# Patient Record
Sex: Female | Born: 1945 | Race: White | Hispanic: No | Marital: Married | Smoking: Never smoker
Health system: Southern US, Community
[De-identification: ages and names within clinical notes are randomized; demographics above are authoritative.]

## PROBLEM LIST (undated history)

## (undated) DIAGNOSIS — B059 Measles without complication: Secondary | ICD-10-CM

## (undated) DIAGNOSIS — I10 Essential (primary) hypertension: Secondary | ICD-10-CM

## (undated) DIAGNOSIS — B019 Varicella without complication: Secondary | ICD-10-CM

## (undated) DIAGNOSIS — E039 Hypothyroidism, unspecified: Secondary | ICD-10-CM

## (undated) DIAGNOSIS — D126 Benign neoplasm of colon, unspecified: Secondary | ICD-10-CM

## (undated) DIAGNOSIS — J45909 Unspecified asthma, uncomplicated: Secondary | ICD-10-CM

## (undated) DIAGNOSIS — K219 Gastro-esophageal reflux disease without esophagitis: Secondary | ICD-10-CM

## (undated) DIAGNOSIS — M1990 Unspecified osteoarthritis, unspecified site: Secondary | ICD-10-CM

## (undated) DIAGNOSIS — A044 Other intestinal Escherichia coli infections: Secondary | ICD-10-CM

## (undated) DIAGNOSIS — C801 Malignant (primary) neoplasm, unspecified: Secondary | ICD-10-CM

## (undated) DIAGNOSIS — B3781 Candidal esophagitis: Secondary | ICD-10-CM

## (undated) DIAGNOSIS — K5732 Diverticulitis of large intestine without perforation or abscess without bleeding: Secondary | ICD-10-CM

## (undated) DIAGNOSIS — K649 Unspecified hemorrhoids: Secondary | ICD-10-CM

## (undated) DIAGNOSIS — J309 Allergic rhinitis, unspecified: Secondary | ICD-10-CM

## (undated) DIAGNOSIS — T7840XA Allergy, unspecified, initial encounter: Secondary | ICD-10-CM

## (undated) DIAGNOSIS — N951 Menopausal and female climacteric states: Secondary | ICD-10-CM

## (undated) HISTORY — DX: Benign neoplasm of colon, unspecified: D12.6

## (undated) HISTORY — DX: Varicella without complication: B01.9

## (undated) HISTORY — DX: Diverticulitis of large intestine without perforation or abscess without bleeding: K57.32

## (undated) HISTORY — PX: CARDIAC CATHETERIZATION: SHX172

## (undated) HISTORY — DX: Malignant (primary) neoplasm, unspecified: C80.1

## (undated) HISTORY — PX: DILATION AND CURETTAGE OF UTERUS: SHX78

## (undated) HISTORY — PX: OTHER SURGICAL HISTORY: SHX169

## (undated) HISTORY — DX: Allergy, unspecified, initial encounter: T78.40XA

## (undated) HISTORY — DX: Unspecified hemorrhoids: K64.9

## (undated) HISTORY — PX: ABDOMINAL HYSTERECTOMY: SHX81

## (undated) HISTORY — DX: Allergic rhinitis, unspecified: J30.9

## (undated) HISTORY — DX: Hypothyroidism, unspecified: E03.9

## (undated) HISTORY — DX: Gastro-esophageal reflux disease without esophagitis: K21.9

## (undated) HISTORY — DX: Candidal esophagitis: B37.81

## (undated) HISTORY — DX: Unspecified asthma, uncomplicated: J45.909

## (undated) HISTORY — DX: Essential (primary) hypertension: I10

## (undated) HISTORY — DX: Menopausal and female climacteric states: N95.1

## (undated) HISTORY — DX: Measles without complication: B05.9

## (undated) HISTORY — DX: Unspecified osteoarthritis, unspecified site: M19.90

## (undated) HISTORY — PX: WISDOM TOOTH EXTRACTION: SHX21

---

## 1988-10-21 HISTORY — PX: OTHER SURGICAL HISTORY: SHX169

## 1999-12-20 ENCOUNTER — Encounter: Payer: Self-pay | Admitting: Family Medicine

## 1999-12-20 ENCOUNTER — Encounter: Admission: RE | Admit: 1999-12-20 | Discharge: 1999-12-20 | Payer: Self-pay | Admitting: Family Medicine

## 2000-07-17 ENCOUNTER — Encounter: Admission: RE | Admit: 2000-07-17 | Discharge: 2000-07-17 | Payer: Self-pay | Admitting: Family Medicine

## 2000-07-17 ENCOUNTER — Encounter: Payer: Self-pay | Admitting: Family Medicine

## 2001-10-21 HISTORY — PX: TOTAL ABDOMINAL HYSTERECTOMY W/ BILATERAL SALPINGOOPHORECTOMY: SHX83

## 2004-03-27 ENCOUNTER — Emergency Department (HOSPITAL_COMMUNITY): Admission: EM | Admit: 2004-03-27 | Discharge: 2004-03-27 | Payer: Self-pay | Admitting: Emergency Medicine

## 2007-11-23 ENCOUNTER — Emergency Department: Payer: Self-pay | Admitting: Emergency Medicine

## 2008-11-27 ENCOUNTER — Emergency Department: Payer: Self-pay | Admitting: Internal Medicine

## 2008-12-07 ENCOUNTER — Ambulatory Visit: Payer: Self-pay | Admitting: Cardiology

## 2008-12-08 ENCOUNTER — Inpatient Hospital Stay (HOSPITAL_COMMUNITY): Admission: EM | Admit: 2008-12-08 | Discharge: 2008-12-09 | Payer: Self-pay | Admitting: Emergency Medicine

## 2008-12-09 HISTORY — PX: OTHER SURGICAL HISTORY: SHX169

## 2009-02-27 ENCOUNTER — Emergency Department (HOSPITAL_COMMUNITY): Admission: EM | Admit: 2009-02-27 | Discharge: 2009-02-27 | Payer: Self-pay | Admitting: Emergency Medicine

## 2009-03-03 ENCOUNTER — Ambulatory Visit: Payer: Self-pay | Admitting: Family Medicine

## 2009-09-05 ENCOUNTER — Ambulatory Visit: Payer: Self-pay | Admitting: Family Medicine

## 2009-10-12 ENCOUNTER — Ambulatory Visit: Payer: Self-pay | Admitting: Gastroenterology

## 2009-10-12 LAB — HM COLONOSCOPY

## 2010-12-24 LAB — HM PAP SMEAR: HM Pap smear: NORMAL

## 2011-01-29 LAB — POCT I-STAT, CHEM 8
BUN: 18 mg/dL (ref 6–23)
Calcium, Ion: 1.15 mmol/L (ref 1.12–1.32)
TCO2: 28 mmol/L (ref 0–100)

## 2011-02-05 LAB — POCT CARDIAC MARKERS
CKMB, poc: 2.9 ng/mL (ref 1.0–8.0)
Myoglobin, poc: 70.8 ng/mL (ref 12–200)
Myoglobin, poc: 73.4 ng/mL (ref 12–200)
Troponin i, poc: <0.05 ng/mL (ref 0.00–0.09)

## 2011-02-05 LAB — BASIC METABOLIC PANEL
BUN: 17 mg/dL (ref 6–23)
BUN: 18 mg/dL (ref 6–23)
CO2: 25 mEq/L (ref 19–32)
Calcium: 8.4 mg/dL (ref 8.4–10.5)
Calcium: 8.6 mg/dL (ref 8.4–10.5)
Creatinine, Ser: 0.76 mg/dL (ref 0.4–1.2)
GFR calc Af Amer: >60 mL/min (ref >60)
GFR calc non Af Amer: >60 mL/min (ref >60)
Glucose, Bld: 137 mg/dL — ABNORMAL HIGH (ref 70–99)
Glucose, Bld: 78 mg/dL (ref 70–99)
Potassium: 3.2 mEq/L — ABNORMAL LOW (ref 3.5–5.1)

## 2011-02-05 LAB — PROTIME-INR: Prothrombin Time: 13.5 seconds (ref 11.6–15.2)

## 2011-02-05 LAB — CBC
HCT: 40.2 % (ref 36.0–46.0)
MCHC: 34.7 g/dL (ref 30.0–36.0)
MCV: 94.1 fL (ref 78.0–100.0)
Platelets: 329 10*3/uL (ref 150–400)
Platelets: 342 10*3/uL (ref 150–400)
RDW: 12.1 % (ref 11.5–15.5)
RDW: 12.4 % (ref 11.5–15.5)
WBC: 7.7 10*3/uL (ref 4.0–10.5)

## 2011-02-05 LAB — CARDIAC PANEL(CRET KIN+CKTOT+MB+TROPI)
CK, MB: 5.6 ng/mL — ABNORMAL HIGH (ref 0.3–4.0)
Relative Index: 3.9 — ABNORMAL HIGH (ref 0.0–2.5)
Total CK: 145 U/L (ref 7–177)
Troponin I: <0.01 ng/mL (ref 0.00–0.06)

## 2011-02-05 LAB — LIPID PANEL
HDL: 58 mg/dL (ref >39)
LDL Cholesterol: 105 mg/dL — ABNORMAL HIGH (ref 0–99)
Triglycerides: 92 mg/dL (ref <150)
VLDL: 18 mg/dL (ref 0–40)

## 2011-02-05 LAB — URINALYSIS, ROUTINE W REFLEX MICROSCOPIC
Bilirubin Urine: NEGATIVE
Glucose, UA: NEGATIVE mg/dL
Ketones, ur: NEGATIVE mg/dL
Specific Gravity, Urine: 1.024 (ref 1.005–1.030)
pH: 6.5 (ref 5.0–8.0)

## 2011-02-05 LAB — CK TOTAL AND CKMB (NOT AT ARMC): Total CK: 131 U/L (ref 7–177)

## 2011-03-05 NOTE — H&P (Signed)
[name], CAR NO.:  [id number]   MEDICAL RECORD NO.:  [id number]          PATIENT TYPE:  EMS   LOCATION:  MAJO                         FACILITY:  [facility]   PHYSICIAN:  Vania Rea, M.D. DATE OF BIRTH:  [birth year]   DATE OF ADMISSION:  12/07/2008  DATE OF DISCHARGE:                              HISTORY & PHYSICAL   PRIMARY CARE PHYSICIAN:  Dr. Nilda Simmer in [place].   CARDIOLOGIST:  Dr. [name] Pares of [facility] in [place].   CHIEF COMPLAINT:  Chest pressure since yesterday afternoon.   HISTORY OF PRESENT ILLNESS:  This is a 65 year old Caucasian lady with a  history of asthma and GERD, who is currently being treated by her  primary care physician for sinusitis and bronchitis and has been started  on Augmentin and prednisone.  Has had much relief from sinusitis and  bronchitis but yesterday afternoon while at home, stood up and had  sudden onset of chest pressure, as if somebody was sitting on her chest  and felt a choking sensation in her throat.  The chest discomfort  radiated down her left arm and up into her jaw.  It was associated with  profuse diaphoresis.  Patient also had dizziness with standing.  She  denies any previous symptoms and eventually called EMS.  Did not get any  relief from the use of oxygen but did get relief with the second  sublingual nitroglycerin.   In the emergency room, the patient has been painfree but somewhat  anxious.  Patient describes also palpitations associated with the  episode.  She has never had any previous episode similar to this.  She  does not smoke.  She has no history of hyperlipidemia.  She is  physically very active on the farm and with animals and does not usually  have dyspnea with exertion.   She does have a strong family heart disease with a brother who had  quadruple bypass at around age 15.  Father had heart disease in his  early 63's.  Mother with multiple CVAs and a history of  heart disease.   Of note, the patient does have a history of GERD, for which she usually  takes ranitidine.  Has been taking Prilosec episodically while on  prednisone and Augmentin, which she does admit upsets her stomach.   PAST MEDICAL HISTORY:  1. Asthma.  2. Bronchitis.  3. GERD.  4. Sinusitis.  5. Hypothyroidism.   MEDICATIONS:  1. Vicodin 5/500 q.4h. p.r.n. but has not used any for about 5 days.  2. Synthroid 75 mcg daily.  3. Prednisone tapered dose.  Currently taking 60 mg daily.  4. Augmentin 875 mg twice daily.  5. Prilosec 20 mg p.r.n.  6. Ranitidine 150 mg twice daily.  7. Pulmicort inhaler twice daily.  8. Albuterol inhaler p.r.n.  9. Flonase nasal spray 2 sprays in each nostril daily.  10.AstraPro nasal spray twice daily.  11.Vivelle-Dot patch estrogen replacement 0.75 mg twice weekly.   PAST SURGICAL HISTORY:  Hysterectomy.   ALLERGIES:  SULFA.   SOCIAL HISTORY:  She denies tobacco, alcohol, or illicit  drug use.  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   REVIEW OF SYSTEMS:  On a 10-point review of systems, significant only  for epigastric discomfort.  Recent history of what sounds like a urinary  tract infection associated with hematuria.  Episodic headache.  Episodic  joint pain.   PHYSICAL EXAMINATION:  A pleasant middle-aged Caucasian lady reclining  in the stretcher, looking somewhat anxious.  VITALS:  Temperature 97.9, pulse 70, respirations 18, blood pressure  110/69.  She is saturating at 96% on room air.  Pupils are round and equal.  Mucous membranes are pink and anicteric.  She has no cervical lymphadenopathy or thyromegaly.  No jugular venous  distention.  She has a scar at the suprasternal notch, status post  partial thyroidectomy for a cyst.  No carotid bruits.  CHEST:  Clear to auscultation bilaterally.  CARDIOVASCULAR:  Regular rhythm without murmur.  ABDOMEN:  Soft.  She does have marked epigastric tenderness.  She  has  normal abdominal bowel sounds.  EXTREMITIES:  Without edema.  She has 2+ pulses bilaterally.  CENTRAL NERVOUS SYSTEM:  Cranial nerves II-XII are grossly intact.  She  has no focal neurologic deficits.   LABS:  She has not had a CBC.  Her serum chemistry is completely normal.  Cardiac enzymes completely normal.  D-dimer undetectable.   Her EKG showed a normal sinus rhythm without ST wave changes.   ASSESSMENT:  1. Unstable angina in a lady with a strong family history, although      gastroesophageal reflux disease could produce a similar      presentation.  She does have many risk factors for gastroesophageal      reflux disease, many risk factors for gastroesophageal reflux      aggravation.  2. History of sinusitis.  3. History of asthma.  4. History of bronchitis.  5. History of hypothyroidism.   PLAN:  Will bring this lady in for serial cardiac enzymes and give her  the benefit of a cardiology consult.  If she rules out from a cardiac  point of view, she can in all likelihood be evaluated as an outpatient  for her gastroesophageal reflux disease.  Other plans as per orders.      Vania Rea, M.D.  Electronically Signed     LC/MEDQ  D:  12/08/2008  T:  12/08/2008  Job:  [id number]   cc:   Dr. Nilda Simmer in [place]  Dr. [name] Pares in [place]

## 2011-03-05 NOTE — Discharge Summary (Signed)
Kimberly Mercer, DIMAANO NO.:  [id number]   MEDICAL RECORD NO.:  [id number]          PATIENT TYPE:  INP   LOCATION:  3707                         FACILITY:  [facility]   PHYSICIAN:  Eduard Clos, MDDATE OF BIRTH:  1945-11-24   DATE OF ADMISSION:  12/07/2008  DATE OF DISCHARGE:                               DISCHARGE SUMMARY   HISTORY OF PRESENT ILLNESS:  A 65 year old female with a known history  of hypothyroidism and history of bronchial asthma who presented with  complaints of chest pain, and was admitted to telemetry floor.  Serial  cardiac enzymes were ordered, which were negative.  Cardiology was  consulted.   The patient had a cardiac cath, which showed an EF of 55% with normal  coronaries as per cardiologist, Dr. Excell Seltzer.  No further cardiac workup  and okayed to discharge home.  The patient was treated for bronchitis  before admission, will continue with the full course of antibiotics, and  the patient was discharged home.  The patient was advised to recheck  albumin and CBC with the primary care physician within a week's time.   PROCEDURES DONE DURING THIS STAY:  1. Cardiac cath showed normal coronaries with an EF of 55%.  2. Chest x-ray on December 07, 2008, showed no active cardiopulmonary      disease.   FINAL DIAGNOSES:  1. Chest pain to rule out acute coronary syndrome.  Normal cardiac      cath.  2. Acute bronchitis.  3. History of bronchial asthma.  4. Hypothyroidism.   DISCHARGE MEDICATIONS:  1. Vicodin 5/500 mg p.o. q.6 h. p.r.n.  2. Synthroid 75 mcg p.o. daily.  3. Augmentin 875 mg p.o. b.i.d. for 6 days and stop.  4. Omeprazole 20 mg p.o. daily.  5. Ranitidine 150 mg p.o. b.i.d.  6. Pulmicort one inhalation b.i.d.  7. Albuterol HFA 2 puffs q.4 h. p.r.n.  8. Flonase 2 sprays to each nostril daily.  9. Astepro nasal spray twice daily.  10.Vivelle-Dot patches, estrogen replacement as taken previously.  11.Prednisone tapering dose.   PLAN:  The patient is advised to follow up with the primary care  physician.  We will recheck a BMET and a CBC within a week's time.  In  the meantime of any recurrence of symptoms, to go to the nearest ER.      Eduard Clos, MD  Electronically Signed     ANK/MEDQ  D:  12/09/2008  T:  12/10/2008  Job:  [id number]

## 2011-08-12 ENCOUNTER — Emergency Department: Payer: Self-pay | Admitting: Emergency Medicine

## 2011-12-26 LAB — LIPID PANEL
Cholesterol: 208 mg/dL — AB (ref 0–200)
HDL: 63 mg/dL (ref 35–70)
LDL Cholesterol: 109 mg/dL

## 2011-12-26 LAB — CBC AND DIFFERENTIAL: Platelets: 282 10*3/uL (ref 150–399)

## 2011-12-26 LAB — HM MAMMOGRAPHY: HM Mammogram: NEGATIVE

## 2011-12-26 LAB — HM PAP SMEAR: HM Pap smear: NORMAL

## 2012-07-04 ENCOUNTER — Encounter: Payer: Self-pay | Admitting: *Deleted

## 2012-07-06 ENCOUNTER — Encounter: Payer: Self-pay | Admitting: Family Medicine

## 2012-07-06 ENCOUNTER — Ambulatory Visit (INDEPENDENT_AMBULATORY_CARE_PROVIDER_SITE_OTHER): Payer: PRIVATE HEALTH INSURANCE | Admitting: Family Medicine

## 2012-07-06 VITALS — BP 119/72 | HR 64 | Temp 98.3°F | Resp 16 | Ht 67.5 in | Wt 150.0 lb

## 2012-07-06 DIAGNOSIS — J45909 Unspecified asthma, uncomplicated: Secondary | ICD-10-CM

## 2012-07-06 DIAGNOSIS — I8390 Asymptomatic varicose veins of unspecified lower extremity: Secondary | ICD-10-CM | POA: Insufficient documentation

## 2012-07-06 DIAGNOSIS — K219 Gastro-esophageal reflux disease without esophagitis: Secondary | ICD-10-CM

## 2012-07-06 DIAGNOSIS — Z23 Encounter for immunization: Secondary | ICD-10-CM

## 2012-07-06 DIAGNOSIS — J309 Allergic rhinitis, unspecified: Secondary | ICD-10-CM | POA: Insufficient documentation

## 2012-07-06 DIAGNOSIS — Z780 Asymptomatic menopausal state: Secondary | ICD-10-CM

## 2012-07-06 MED ORDER — ESTRADIOL 0.075 MG/24HR TD PTTW: 1.0000 | MEDICATED_PATCH | TRANSDERMAL | Status: DC

## 2012-07-06 NOTE — Progress Notes (Signed)
Subjective:    Patient ID: Kimberly Mercer, female    DOB: [birth year], 66 y.o.   MRN: [id number]  HPI This 66 y.o. female presents for evaluation of the following:  1.  Allergic Rhinitis: six month follow-up for AR; uses Zyrtec 2.12ml every three days with good control of symptoms.  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 Husband working out of town and tolerating dust in hotel rooms.  No major issues since eliminating wheat from diet.  2. Asthma:  Six month follow-up on asthma.  Much improved since eliminating wheat from diet.  Used Albuterol three times in past six months.  Able to wear scented perfumes.  No chronic cough; no chest pain or SOB.    3. GERD: six month follow-up.  stopped wheat and gluten in Spring 2013; started feeling horribly after eating.  Granddaughter is allergic to wheat; she is 64yo.  Clinically improved in 48 hours.  Rechallenged with pasta and felt horrible.  Rechallenged again.  Taking Omeprazole sparingly; usually will take when drinks alcohol and when takes NSAID for arthralgias.  Taking Zantac PRN.  Denies n/v/d/c.  Regular daily bowel movement.  4. Menopause: stable; needs refill on Vivelle Dot.    5. Flu vaccine: agreeable.  6.  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  Review of Systems  Constitutional: Negative for fever, chills and fatigue.  HENT: Negative for congestion, sore throat, rhinorrhea, sneezing, mouth sores and postnasal drip.   Eyes: Negative for pain, discharge and itching.  Respiratory: Negative for cough, choking, shortness of breath and wheezing.   Cardiovascular: Negative for chest pain, palpitations and leg swelling.  Gastrointestinal: Negative for nausea, vomiting, abdominal pain, diarrhea, constipation, blood in stool and abdominal distention.  Musculoskeletal: Positive for arthralgias. Negative for myalgias and joint swelling.  Skin: Negative for  pallor.        Past Medical History  Diagnosis Date  . Unspecified asthma   . Diverticulitis of colon (without mention of hemorrhage)   . Allergic rhinitis, cause unspecified   . Symptomatic menopausal or female climacteric states   . GERD (gastroesophageal reflux disease)   . Unspecified hypothyroidism   . Unspecified essential hypertension   . Candidiasis of the esophagus   . Osteoarthrosis, unspecified whether generalized or localized, unspecified site   . Benign neoplasm of colon   . Hemorrhoids     internal  . Chicken pox   . Measles     Past Surgical History  Procedure Date  . Wisdom tooth extraction   . Dilatation and curettage     due to SAB x 2  . Thyroid nodule 1990    benign  . Total abdominal hysterectomy w/ bilateral salpingoophorectomy 2003    complex endometrial hyperplasia, DUB, fibroids  . Cardiac catherization 12/09/2008    normal coranaries,normal EF.    Prior to Admission medications   Medication Sig Start Date End Date Taking? Authorizing Provider  albuterol (PROAIR HFA) 108 (90 BASE) MCG/ACT inhaler Inhale 2 puffs into the lungs every 6 (six) hours as needed.   Yes Historical Provider, MD  aspirin 81 MG tablet Take 81 mg by mouth daily.   Yes Historical Provider, MD  CALCIUM-MAGNESUIUM-ZINC 333-133-8.3 MG TABS Take 3 tablets by mouth daily.   Yes Historical Provider, MD  estradiol (VIVELLE-DOT) 0.075 MG/24HR Place 1 patch onto the skin 2 (two) times a week. 07/06/12  Yes Ethelda Chick, MD  levothyroxine (SYNTHROID,  LEVOTHROID) 75 MCG tablet Take 75 mcg by mouth daily.   Yes Historical Provider, MD  montelukast (SINGULAIR) 10 MG tablet Take 10 mg by mouth daily.   Yes Historical Provider, MD  Multiple Vitamin (MULTIVITAMIN) tablet Take 1 tablet by mouth daily.   Yes Historical Provider, MD  omeprazole (PRILOSEC OTC) 20 MG tablet Take 20 mg by mouth daily.   Yes Historical Provider, MD  ranitidine (ZANTAC) 150 MG tablet Take 150 mg by mouth 2 (two)  times daily.   Yes Historical Provider, MD  Cetirizine HCl (ZYRTEC) 5 MG/5ML SYRP Take by mouth daily. 1/2 tsp. Oral 1-2 times daily    Historical Provider, MD    Allergies  Allergen Reactions  . Claritin (Loratadine)     Panic attacks  . Levaquin (Levofloxacin) Nausea Only    Yeast infection  . Sulfa Drugs Cross Reactors Hives    History   Social History  . Marital Status: Married    Spouse Name: N/A    Number of Children: 2  . Years of Education: masters   Occupational History  . Retired     Runner, broadcasting/film/video   Social History Main Topics  . Smoking status: Never Smoker   . Smokeless tobacco: Not on file  . Alcohol Use: No  . Drug Use: No  . Sexually Active: Not on file   Other Topics Concern  . Not on file   Social History Narrative   Always uses seat belts. Smoke alarm in the home. No caffeine use. 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 Married x 15 years;3rd marriage. Happily married. Husband with bladder cancer and possible gastric cancer 2012-2013. Has 2 children, 2 grandchildren.    Family History  Problem Relation Age of Onset  . Cancer Sister     Breast  . Heart disease Mother     CAD  . Stroke Mother     x 2  . Mental illness Mother     not diagnosed  . Heart disease Father     CAD  . Parkinsonism Father   . Cancer      Breast and Ovarian  . Irritable bowel syndrome Mother     Objective:   Physical Exam  Nursing note and vitals reviewed. Constitutional: She is oriented to person, place, and time. She appears well-developed and well-nourished. No distress.  HENT:  Head: Normocephalic and atraumatic.  Right Ear: External ear normal.  Left Ear: External ear normal.  Nose: Nose normal.  Mouth/Throat: Oropharynx is clear and moist.  Eyes: Conjunctivae normal and EOM are normal. Pupils are equal, round, and reactive to light.  Neck: Normal range of motion. Neck supple. No thyromegaly present.    Cardiovascular: Normal rate, regular rhythm, normal heart sounds and intact distal pulses.   No murmur heard.      VARICOSITIES LLE.  Pulmonary/Chest: Effort normal and breath sounds normal. No respiratory distress. She has no wheezes. She has no rales.  Abdominal: Soft. Bowel sounds are normal. She exhibits no distension. There is no tenderness. There is no rebound.  Lymphadenopathy:    She has cervical adenopathy.  Neurological: She is alert and oriented to person, place, and time. No cranial nerve deficit. She exhibits normal muscle tone.  Skin: Skin is warm and dry. She is not diaphoretic.  Psychiatric: She has a normal mood and affect. Her behavior is normal. Judgment and thought content normal.    FLU VACCINE ADMINISTERED.     Assessment &  Plan:   1. Need for influenza vaccination  Flu vaccine greater than or equal to 3yo preservative free IM  2. Menopause  estradiol (VIVELLE-DOT) 0.075 MG/24HR  3. Asthma    4. GERD (gastroesophageal reflux disease)    5. Allergic rhinitis    6. Varicose veins       1.  Menopause: stable; refill of Vivelle Dot provided. 2.  Asthma: improved; rarely needing Albuterol at this time; s/p flu vaccine. 3.  GERD: improved with elimination of gluten/wheat from diet.   4.  Allergic Rhinitis: improved.  Using Zyrtec every three days PRN. 5.  Varicose Veins: worsening; reassurance; asymptomatic at this time thus no intervention warranted. 6.  S/p flu vaccine.  F/u 6 months for CPE.

## 2012-07-07 ENCOUNTER — Encounter: Payer: Self-pay | Admitting: Family Medicine

## 2012-07-07 NOTE — Progress Notes (Signed)
Reviewed and agree.

## 2012-07-27 ENCOUNTER — Other Ambulatory Visit: Payer: Self-pay | Admitting: Radiology

## 2012-07-27 MED ORDER — RANITIDINE HCL 150 MG PO TABS: 150.0000 mg | ORAL_TABLET | Freq: Two times a day (BID) | ORAL | Status: DC

## 2012-07-27 NOTE — Telephone Encounter (Signed)
Please advise on refill of Ranitidine, order pended for this, she has seen Dr Katrinka Blazing here recently.

## 2012-09-23 ENCOUNTER — Ambulatory Visit (INDEPENDENT_AMBULATORY_CARE_PROVIDER_SITE_OTHER): Payer: PRIVATE HEALTH INSURANCE | Admitting: Family Medicine

## 2012-09-23 ENCOUNTER — Encounter: Payer: Self-pay | Admitting: Family Medicine

## 2012-09-23 VITALS — BP 122/70 | HR 81 | Temp 97.8°F | Resp 16 | Ht 66.5 in | Wt 150.6 lb

## 2012-09-23 DIAGNOSIS — J45901 Unspecified asthma with (acute) exacerbation: Secondary | ICD-10-CM

## 2012-09-23 DIAGNOSIS — H6690 Otitis media, unspecified, unspecified ear: Secondary | ICD-10-CM

## 2012-09-23 DIAGNOSIS — J45909 Unspecified asthma, uncomplicated: Secondary | ICD-10-CM

## 2012-09-23 MED ORDER — AMOXICILLIN-POT CLAVULANATE 875-125 MG PO TABS: 1.0000 | ORAL_TABLET | Freq: Two times a day (BID) | ORAL | Status: DC

## 2012-09-23 MED ORDER — FLUCONAZOLE 150 MG PO TABS: 150.0000 mg | ORAL_TABLET | Freq: Once | ORAL | Status: DC

## 2012-09-23 NOTE — Progress Notes (Signed)
[address]   [place], [place]  [address]   [phone]  Subjective:    Patient ID: Kimberly Mercer, female    DOB: 31-Mar-1946, 66 y.o.   MRN: [id number]  HPIThis 66 y.o. female presents for evaluation of ear pain, sinus pressure.  Onset four days ago. +exhaustion.  Low fever Tminimum 95.2.  Pulse in R ear for three days.  Neck hurt.  L ear congestion.  Myalgias.  Felt much better this morning.  Throat no longer swollen.  Ear is full; Decreased hearing in R ear.  To leave out of town tomorrow.  +nasal congestion.  +rhinorrhea.  Taking Sudafed, Tylenol, Vicks vapor rub, gargled with salt water.  Horrible body aches.    Scant cough; +SOB; no sputum.  Albuterol helping.  Has used albuterol twice in past four days.  No v/d.  No rash.  S/p flu vaccine.   Has been traveling a lot out of town.  Had out of town family for holidays.  Has not checked peak flows.  Review of Systems  Constitutional: Positive for fever, chills, diaphoresis and fatigue.  HENT: Positive for hearing loss, ear pain, congestion, sore throat, rhinorrhea, voice change and postnasal drip. Negative for drooling, trouble swallowing, neck pain, neck stiffness and tinnitus.   Respiratory: Positive for cough and shortness of breath. Negative for wheezing.   Gastrointestinal: Negative for nausea, vomiting, abdominal pain and diarrhea.  Skin: Negative for rash.    Past Medical History  Diagnosis Date  . Unspecified asthma   . Diverticulitis of colon (without mention of hemorrhage)   . Allergic rhinitis, cause unspecified   . Symptomatic menopausal or female climacteric states   . GERD (gastroesophageal reflux disease)   . Unspecified hypothyroidism   . Unspecified essential hypertension   . Candidiasis of the esophagus   . Osteoarthrosis, unspecified whether generalized or localized, unspecified site   . Benign neoplasm of colon   . Hemorrhoids     internal  . Chicken pox   . Measles     Past Surgical History  Procedure  Date  . Wisdom tooth extraction   . Dilatation and curettage     due to SAB x 2  . Thyroid nodule 1990    benign  . Total abdominal hysterectomy w/ bilateral salpingoophorectomy 2003    complex endometrial hyperplasia, DUB, fibroids  . Cardiac catherization 12/09/2008    normal coranaries,normal EF.    Prior to Admission medications   Medication Sig Start Date End Date Taking? Authorizing Provider  albuterol (PROAIR HFA) 108 (90 BASE) MCG/ACT inhaler Inhale 2 puffs into the lungs every 6 (six) hours as needed.   Yes Historical Provider, MD  aspirin 81 MG tablet Take 81 mg by mouth daily.   Yes Historical Provider, MD  CALCIUM-MAGNESUIUM-ZINC 333-133-8.3 MG TABS Take 3 tablets by mouth daily.   Yes Historical Provider, MD  Cetirizine HCl (ZYRTEC) 5 MG/5ML SYRP Take by mouth daily. 1/2 tsp. Oral 1-2 times daily   Yes Historical Provider, MD  estradiol (VIVELLE-DOT) 0.075 MG/24HR Place 1 patch onto the skin 2 (two) times a week. 07/06/12  Yes Ethelda Chick, MD  levothyroxine (SYNTHROID, LEVOTHROID) 75 MCG tablet Take 75 mcg by mouth daily.   Yes Historical Provider, MD  montelukast (SINGULAIR) 10 MG tablet Take 10 mg by mouth daily.   Yes Historical Provider, MD  Multiple Vitamin (MULTIVITAMIN) tablet Take 1 tablet by mouth daily.   Yes Historical Provider, MD  omeprazole (PRILOSEC OTC) 20 MG  tablet Take 20 mg by mouth daily.   Yes Historical Provider, MD  ranitidine (ZANTAC) 150 MG tablet Take 1 tablet (150 mg total) by mouth 2 (two) times daily. 07/27/12  Yes Morrell Riddle, PA-C  amoxicillin-clavulanate (AUGMENTIN) 875-125 MG per tablet Take 1 tablet by mouth 2 (two) times daily. 09/23/12   Ethelda Chick, MD  fluconazole (DIFLUCAN) 150 MG tablet Take 1 tablet (150 mg total) by mouth once. Repeat if needed 09/23/12   Ethelda Chick, MD    Allergies  Allergen Reactions  . Claritin (Loratadine)     Panic attacks  . Gluten Meal Other (See Comments)    GI UPSET - CRAMPING  . Levaquin  (Levofloxacin) Nausea Only    Yeast infection  . Sulfa Drugs Cross Reactors Hives    History   Social History  . Marital Status: Married    Spouse Name: N/A    Number of Children: 2  . Years of Education: masters   Occupational History  . Retired     Runner, broadcasting/film/video   Social History Main Topics  . Smoking status: Never Smoker   . Smokeless tobacco: Not on file  . Alcohol Use: No  . Drug Use: No  . Sexually Active: Not on file   Other Topics Concern  . Not on file   Social History Narrative   Always uses seat belts. Smoke alarm in the home. No caffeine use. 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 Married x 15 years;3rd marriage. Happily married. Husband with bladder cancer and possible gastric cancer 2012-2013. Has 2 children, 2 grandchildren.    Family History  Problem Relation Age of Onset  . Cancer Sister     Breast  . Heart disease Mother     CAD  . Stroke Mother     x 2  . Mental illness Mother     not diagnosed  . Heart disease Father     CAD  . Parkinsonism Father   . Cancer      Breast and Ovarian  . Irritable bowel syndrome Mother         Objective:   Physical Exam  Nursing note reviewed. Constitutional: She is oriented to person, place, and time. She appears well-developed and well-nourished. No distress.  HENT:  Head: Normocephalic and atraumatic.  Right Ear: No drainage, swelling or tenderness. No foreign bodies. Tympanic membrane is injected, erythematous and retracted. Tympanic membrane is not perforated. Tympanic membrane mobility is abnormal. No middle ear effusion. Decreased hearing is noted.  Left Ear: External ear normal.  Nose: Nose normal.  Mouth/Throat: No oropharyngeal exudate.  Neck: Normal range of motion. Neck supple.  Cardiovascular: Normal rate, regular rhythm and normal heart sounds.   No murmur heard. Pulmonary/Chest: Effort normal and breath sounds normal. No respiratory  distress. She has no wheezes. She has no rales.  Lymphadenopathy:    She has cervical adenopathy.  Neurological: She is alert and oriented to person, place, and time.  Skin: Rash noted. She is not diaphoretic. There is erythema.       Anterior chest and upper back erythematous rash.  Psychiatric: She has a normal mood and affect. Her behavior is normal.      Assessment & Plan:   1. Otitis media   2. Asthma attack      1.  R Otitis Media:  New. Rx for Augmentin provided.  Continue Sudafed, Tylenol PRN.  Add Zyrtec daily.   2.  Asthma Exacerbation: New.  Mild.  Improved from onset.  Continue Albuterol PRN. 3. URI:  New. Supportive care with rest, fluids, Sudafed, Tylenol and Ibuprofen.

## 2012-09-28 ENCOUNTER — Telehealth: Payer: Self-pay

## 2012-09-28 NOTE — Telephone Encounter (Signed)
PT SAW SMITH FOR HER EAR INFECTION.  SAYS THE ANTIBIOTICS HAVE HELPED WITH THE PAIN, BUT IT IS STILL STOPPED UP AND SHE CANT HEAR VERY WELL.  CAN SOMETHING ELSE BE CALLED IN?  CALL [phone]

## 2012-09-28 NOTE — Telephone Encounter (Signed)
She is taking Sudafed, she states this makes her restless. She is advised to try Mucinex and see if this helps the congestion. I have also advised her to try Afrin for 2 days only to see if this helps also. Kimberly Mercer

## 2012-09-28 NOTE — Telephone Encounter (Signed)
We can also try Flonase which is a nasal steroid in addition to the Sudafed, Afrin.  If agreeable, call in:  Flonase/Fluticasone nasal spray two sprays each nostril once daily  #1 bottle 2 refills. KMS

## 2012-09-29 MED ORDER — FLUTICASONE PROPIONATE 50 MCG/ACT NA SUSP: 2.0000 | Freq: Every day | NASAL | Status: DC

## 2012-09-29 NOTE — Addendum Note (Signed)
Addended byCaffie Damme on: 09/29/2012 09:38 AM   Modules accepted: Orders

## 2012-09-29 NOTE — Telephone Encounter (Signed)
Spoke to patient, sent this in for her.

## 2012-10-01 ENCOUNTER — Telehealth: Payer: Self-pay

## 2012-10-01 DIAGNOSIS — H9190 Unspecified hearing loss, unspecified ear: Secondary | ICD-10-CM

## 2012-10-01 DIAGNOSIS — H6690 Otitis media, unspecified, unspecified ear: Secondary | ICD-10-CM

## 2012-10-01 NOTE — Telephone Encounter (Signed)
She states it is not painful, and I have advised her it is not unusual for her hearing to take several weeks, she does want to proceed with the referral. Thanks. She has the appt scheduled for tomorrow.

## 2012-10-01 NOTE — Telephone Encounter (Signed)
Call --- is ear pain better/resolved?  Unfortunately, after an ear infection, it can take 3 weeks for hearing to return.  I am happy to refer to ENT; it may take 1-3 weeks to get an appointment which will be appropriate evaluation if hearing not improved in three weeks from treatment. OK to order referral for ENT/[facility] or [facility] in [place] for Otitis Media, decreased hearing of affected ear. I have placed order.

## 2012-10-01 NOTE — Telephone Encounter (Signed)
I pended, if you approve, fill in the name of physician, or practice and sent back to me , I will call her.

## 2012-10-01 NOTE — Telephone Encounter (Signed)
PATIENT STATES SHE SAW DR. Katrinka Blazing ON WED. DEC. 4TH FOR AN EAR INFECTION. SHE SAYS HER EAR IS STILL BLOCKED AND  HER HEARING IS NOT ANY BETTER. SHE WOULD LIKE TO BE REFERRED TO AN ENT SPECIALIST IN [place].  BEST PHONE [phone] (CELL)  [facility]

## 2012-11-04 ENCOUNTER — Telehealth: Payer: Self-pay

## 2012-11-04 NOTE — Telephone Encounter (Signed)
Pt has an appt on 3/24 and needs just her ranitadine 150mg .  Can we refill?

## 2012-11-04 NOTE — Telephone Encounter (Signed)
Patient has an appt scheduled with Dr Katrinka Blazing on 3/24 for her CPE but there are 2 meds that she will run out of before then. She is a former patient at [facility]  [phone]  [facility]  [phone]

## 2012-11-05 MED ORDER — RANITIDINE HCL 150 MG PO TABS: 150.0000 mg | ORAL_TABLET | Freq: Two times a day (BID) | ORAL | Status: DC

## 2012-11-05 NOTE — Telephone Encounter (Signed)
Sent.  Meds ordered this encounter  Medications  . ranitidine (ZANTAC) 150 MG tablet    Sig: Take 1 tablet (150 mg total) by mouth 2 (two) times daily.    Dispense:  60 tablet    Refill:  1    Order Specific Question:  Supervising Provider    Answer:  DOOLITTLE, ROBERT P [[id number]]

## 2012-11-05 NOTE — Telephone Encounter (Signed)
Patient notified and voiced understanding.

## 2012-11-05 NOTE — Addendum Note (Signed)
Addended by: Fernande Bras on: 11/05/2012 01:22 PM   Modules accepted: Orders

## 2012-12-07 ENCOUNTER — Ambulatory Visit: Payer: Self-pay | Admitting: Ophthalmology

## 2012-12-30 ENCOUNTER — Encounter: Payer: PRIVATE HEALTH INSURANCE | Admitting: Family Medicine

## 2013-01-11 ENCOUNTER — Encounter: Payer: PRIVATE HEALTH INSURANCE | Admitting: Family Medicine

## 2013-02-20 ENCOUNTER — Emergency Department: Payer: Self-pay | Admitting: Emergency Medicine

## 2013-02-20 LAB — TROPONIN I: Troponin-I: <0.02 ng/mL

## 2013-02-20 LAB — TSH: Thyroid Stimulating Horm: 2.41 u[IU]/mL

## 2013-02-20 LAB — CBC
MCV: 92 fL (ref 80–100)
RBC: 4.55 10*6/uL (ref 3.80–5.20)
RDW: 12.7 % (ref 11.5–14.5)
WBC: 4.9 10*3/uL (ref 3.6–11.0)

## 2013-02-20 LAB — BASIC METABOLIC PANEL
Calcium, Total: 9.3 mg/dL (ref 8.5–10.1)
Chloride: 107 mmol/L (ref 98–107)
Creatinine: 1.21 mg/dL (ref 0.60–1.30)
EGFR (African American): 55 — ABNORMAL LOW
Glucose: 112 mg/dL — ABNORMAL HIGH (ref 65–99)
Osmolality: 285 (ref 275–301)
Sodium: 141 mmol/L (ref 136–145)

## 2013-02-20 LAB — CK TOTAL AND CKMB (NOT AT ARMC): CK, Total: 222 U/L — ABNORMAL HIGH (ref 21–215)

## 2014-01-03 DIAGNOSIS — R000 Tachycardia, unspecified: Secondary | ICD-10-CM | POA: Insufficient documentation

## 2014-01-03 DIAGNOSIS — E079 Disorder of thyroid, unspecified: Secondary | ICD-10-CM | POA: Insufficient documentation

## 2014-01-03 DIAGNOSIS — R002 Palpitations: Secondary | ICD-10-CM | POA: Insufficient documentation

## 2015-11-27 ENCOUNTER — Other Ambulatory Visit: Payer: Self-pay | Admitting: Surgery

## 2015-11-27 DIAGNOSIS — M75121 Complete rotator cuff tear or rupture of right shoulder, not specified as traumatic: Secondary | ICD-10-CM

## 2015-12-14 ENCOUNTER — Ambulatory Visit: Payer: Self-pay

## 2015-12-18 DIAGNOSIS — M180 Bilateral primary osteoarthritis of first carpometacarpal joints: Secondary | ICD-10-CM | POA: Insufficient documentation

## 2016-11-04 DIAGNOSIS — Z79899 Other long term (current) drug therapy: Secondary | ICD-10-CM | POA: Diagnosis not present

## 2016-11-04 DIAGNOSIS — E039 Hypothyroidism, unspecified: Secondary | ICD-10-CM | POA: Diagnosis not present

## 2016-11-04 DIAGNOSIS — R739 Hyperglycemia, unspecified: Secondary | ICD-10-CM | POA: Diagnosis not present

## 2016-11-12 DIAGNOSIS — I10 Essential (primary) hypertension: Secondary | ICD-10-CM | POA: Diagnosis not present

## 2016-11-12 DIAGNOSIS — Z79899 Other long term (current) drug therapy: Secondary | ICD-10-CM | POA: Diagnosis not present

## 2016-11-12 DIAGNOSIS — E039 Hypothyroidism, unspecified: Secondary | ICD-10-CM | POA: Diagnosis not present

## 2016-11-12 DIAGNOSIS — K219 Gastro-esophageal reflux disease without esophagitis: Secondary | ICD-10-CM | POA: Diagnosis not present

## 2016-11-12 DIAGNOSIS — R739 Hyperglycemia, unspecified: Secondary | ICD-10-CM | POA: Diagnosis not present

## 2016-11-12 DIAGNOSIS — R002 Palpitations: Secondary | ICD-10-CM | POA: Diagnosis not present

## 2017-01-21 DIAGNOSIS — R739 Hyperglycemia, unspecified: Secondary | ICD-10-CM | POA: Diagnosis not present

## 2017-01-22 DIAGNOSIS — J329 Chronic sinusitis, unspecified: Secondary | ICD-10-CM | POA: Diagnosis not present

## 2017-01-22 DIAGNOSIS — R002 Palpitations: Secondary | ICD-10-CM | POA: Diagnosis not present

## 2017-01-22 DIAGNOSIS — K219 Gastro-esophageal reflux disease without esophagitis: Secondary | ICD-10-CM | POA: Diagnosis not present

## 2017-01-22 DIAGNOSIS — E079 Disorder of thyroid, unspecified: Secondary | ICD-10-CM | POA: Diagnosis not present

## 2017-01-22 DIAGNOSIS — M1990 Unspecified osteoarthritis, unspecified site: Secondary | ICD-10-CM | POA: Diagnosis not present

## 2017-01-22 DIAGNOSIS — K589 Irritable bowel syndrome without diarrhea: Secondary | ICD-10-CM | POA: Diagnosis not present

## 2017-01-22 DIAGNOSIS — R000 Tachycardia, unspecified: Secondary | ICD-10-CM | POA: Diagnosis not present

## 2017-01-22 DIAGNOSIS — J45909 Unspecified asthma, uncomplicated: Secondary | ICD-10-CM | POA: Diagnosis not present

## 2017-01-22 DIAGNOSIS — R42 Dizziness and giddiness: Secondary | ICD-10-CM | POA: Diagnosis not present

## 2017-05-05 DIAGNOSIS — R739 Hyperglycemia, unspecified: Secondary | ICD-10-CM | POA: Diagnosis not present

## 2017-05-05 DIAGNOSIS — I10 Essential (primary) hypertension: Secondary | ICD-10-CM | POA: Diagnosis not present

## 2017-05-05 DIAGNOSIS — Z79899 Other long term (current) drug therapy: Secondary | ICD-10-CM | POA: Diagnosis not present

## 2017-05-05 DIAGNOSIS — E039 Hypothyroidism, unspecified: Secondary | ICD-10-CM | POA: Diagnosis not present

## 2017-05-12 DIAGNOSIS — Z79899 Other long term (current) drug therapy: Secondary | ICD-10-CM | POA: Diagnosis not present

## 2017-05-12 DIAGNOSIS — R002 Palpitations: Secondary | ICD-10-CM | POA: Diagnosis not present

## 2017-05-12 DIAGNOSIS — R739 Hyperglycemia, unspecified: Secondary | ICD-10-CM | POA: Diagnosis not present

## 2017-05-12 DIAGNOSIS — E7800 Pure hypercholesterolemia, unspecified: Secondary | ICD-10-CM | POA: Diagnosis not present

## 2017-05-12 DIAGNOSIS — E039 Hypothyroidism, unspecified: Secondary | ICD-10-CM | POA: Diagnosis not present

## 2017-05-12 DIAGNOSIS — J4530 Mild persistent asthma, uncomplicated: Secondary | ICD-10-CM | POA: Diagnosis not present

## 2017-05-16 DIAGNOSIS — Z1231 Encounter for screening mammogram for malignant neoplasm of breast: Secondary | ICD-10-CM | POA: Diagnosis not present

## 2017-05-16 DIAGNOSIS — Z01419 Encounter for gynecological examination (general) (routine) without abnormal findings: Secondary | ICD-10-CM | POA: Diagnosis not present

## 2017-07-11 DIAGNOSIS — Z1231 Encounter for screening mammogram for malignant neoplasm of breast: Secondary | ICD-10-CM | POA: Diagnosis not present

## 2017-08-05 DIAGNOSIS — J45909 Unspecified asthma, uncomplicated: Secondary | ICD-10-CM | POA: Diagnosis not present

## 2017-08-05 DIAGNOSIS — R000 Tachycardia, unspecified: Secondary | ICD-10-CM | POA: Diagnosis not present

## 2017-08-05 DIAGNOSIS — M1990 Unspecified osteoarthritis, unspecified site: Secondary | ICD-10-CM | POA: Diagnosis not present

## 2017-08-05 DIAGNOSIS — K219 Gastro-esophageal reflux disease without esophagitis: Secondary | ICD-10-CM | POA: Diagnosis not present

## 2017-08-05 DIAGNOSIS — R002 Palpitations: Secondary | ICD-10-CM | POA: Diagnosis not present

## 2017-08-05 DIAGNOSIS — E079 Disorder of thyroid, unspecified: Secondary | ICD-10-CM | POA: Diagnosis not present

## 2017-08-22 DIAGNOSIS — Z23 Encounter for immunization: Secondary | ICD-10-CM | POA: Diagnosis not present

## 2017-11-06 DIAGNOSIS — R739 Hyperglycemia, unspecified: Secondary | ICD-10-CM | POA: Diagnosis not present

## 2017-11-06 DIAGNOSIS — E7800 Pure hypercholesterolemia, unspecified: Secondary | ICD-10-CM | POA: Diagnosis not present

## 2017-11-06 DIAGNOSIS — Z79899 Other long term (current) drug therapy: Secondary | ICD-10-CM | POA: Diagnosis not present

## 2017-11-06 DIAGNOSIS — E039 Hypothyroidism, unspecified: Secondary | ICD-10-CM | POA: Diagnosis not present

## 2017-11-13 DIAGNOSIS — E039 Hypothyroidism, unspecified: Secondary | ICD-10-CM | POA: Diagnosis not present

## 2017-11-13 DIAGNOSIS — Z0000 Encounter for general adult medical examination without abnormal findings: Secondary | ICD-10-CM | POA: Diagnosis not present

## 2017-11-13 DIAGNOSIS — R634 Abnormal weight loss: Secondary | ICD-10-CM | POA: Diagnosis not present

## 2017-11-13 DIAGNOSIS — R739 Hyperglycemia, unspecified: Secondary | ICD-10-CM | POA: Diagnosis not present

## 2017-11-19 DIAGNOSIS — R1013 Epigastric pain: Secondary | ICD-10-CM | POA: Diagnosis not present

## 2017-11-19 DIAGNOSIS — Z7689 Persons encountering health services in other specified circumstances: Secondary | ICD-10-CM | POA: Diagnosis not present

## 2017-11-19 DIAGNOSIS — R1310 Dysphagia, unspecified: Secondary | ICD-10-CM | POA: Diagnosis not present

## 2017-11-19 DIAGNOSIS — K582 Mixed irritable bowel syndrome: Secondary | ICD-10-CM | POA: Diagnosis not present

## 2017-11-19 DIAGNOSIS — R1011 Right upper quadrant pain: Secondary | ICD-10-CM | POA: Diagnosis not present

## 2017-11-19 DIAGNOSIS — D72819 Decreased white blood cell count, unspecified: Secondary | ICD-10-CM | POA: Diagnosis not present

## 2017-11-19 DIAGNOSIS — K219 Gastro-esophageal reflux disease without esophagitis: Secondary | ICD-10-CM | POA: Diagnosis not present

## 2017-11-19 DIAGNOSIS — R634 Abnormal weight loss: Secondary | ICD-10-CM | POA: Diagnosis not present

## 2017-11-20 ENCOUNTER — Other Ambulatory Visit: Payer: Self-pay | Admitting: Gastroenterology

## 2017-11-20 DIAGNOSIS — R1013 Epigastric pain: Secondary | ICD-10-CM

## 2017-11-20 DIAGNOSIS — K219 Gastro-esophageal reflux disease without esophagitis: Secondary | ICD-10-CM

## 2017-11-20 DIAGNOSIS — R1011 Right upper quadrant pain: Secondary | ICD-10-CM

## 2017-11-26 ENCOUNTER — Ambulatory Visit
Admission: RE | Admit: 2017-11-26 | Discharge: 2017-11-26 | Disposition: A | Discharge status: Home / Self Care | Payer: Medicare Other | Source: Ambulatory Visit | Attending: Gastroenterology | Admitting: Gastroenterology

## 2017-11-26 DIAGNOSIS — K219 Gastro-esophageal reflux disease without esophagitis: Secondary | ICD-10-CM | POA: Insufficient documentation

## 2017-11-26 DIAGNOSIS — R1010 Upper abdominal pain, unspecified: Secondary | ICD-10-CM | POA: Diagnosis not present

## 2017-11-26 DIAGNOSIS — R1011 Right upper quadrant pain: Secondary | ICD-10-CM | POA: Diagnosis not present

## 2017-11-26 DIAGNOSIS — R1013 Epigastric pain: Secondary | ICD-10-CM | POA: Diagnosis not present

## 2017-11-27 ENCOUNTER — Ambulatory Visit: Payer: Medicare Other

## 2017-12-08 ENCOUNTER — Encounter
Admission: RE | Admit: 2017-12-08 | Discharge: 2017-12-08 | Disposition: A | Discharge status: Home / Self Care | Payer: Medicare Other | Source: Ambulatory Visit | Attending: Gastroenterology | Admitting: Gastroenterology

## 2017-12-08 DIAGNOSIS — K219 Gastro-esophageal reflux disease without esophagitis: Secondary | ICD-10-CM | POA: Insufficient documentation

## 2017-12-08 DIAGNOSIS — R1011 Right upper quadrant pain: Secondary | ICD-10-CM | POA: Insufficient documentation

## 2017-12-08 DIAGNOSIS — R1013 Epigastric pain: Secondary | ICD-10-CM | POA: Insufficient documentation

## 2017-12-08 MED ORDER — TECHNETIUM TC 99M MEBROFENIN IV KIT
5.1700 | PACK | Freq: Once | INTRAVENOUS | Status: AC | PRN
  Administered 2017-12-08: 5.17 via INTRAVENOUS

## 2017-12-18 DIAGNOSIS — R634 Abnormal weight loss: Secondary | ICD-10-CM | POA: Diagnosis not present

## 2017-12-18 DIAGNOSIS — K219 Gastro-esophageal reflux disease without esophagitis: Secondary | ICD-10-CM | POA: Diagnosis not present

## 2017-12-18 DIAGNOSIS — K529 Noninfective gastroenteritis and colitis, unspecified: Secondary | ICD-10-CM | POA: Diagnosis not present

## 2017-12-18 DIAGNOSIS — K5730 Diverticulosis of large intestine without perforation or abscess without bleeding: Secondary | ICD-10-CM | POA: Diagnosis not present

## 2017-12-18 DIAGNOSIS — K2900 Acute gastritis without bleeding: Secondary | ICD-10-CM | POA: Diagnosis not present

## 2017-12-18 DIAGNOSIS — R1011 Right upper quadrant pain: Secondary | ICD-10-CM | POA: Diagnosis not present

## 2017-12-18 DIAGNOSIS — K3189 Other diseases of stomach and duodenum: Secondary | ICD-10-CM | POA: Diagnosis not present

## 2017-12-18 DIAGNOSIS — K640 First degree hemorrhoids: Secondary | ICD-10-CM | POA: Diagnosis not present

## 2017-12-18 DIAGNOSIS — R198 Other specified symptoms and signs involving the digestive system and abdomen: Secondary | ICD-10-CM | POA: Diagnosis not present

## 2017-12-18 DIAGNOSIS — R1310 Dysphagia, unspecified: Secondary | ICD-10-CM | POA: Diagnosis not present

## 2017-12-18 DIAGNOSIS — K648 Other hemorrhoids: Secondary | ICD-10-CM | POA: Diagnosis not present

## 2017-12-18 DIAGNOSIS — K319 Disease of stomach and duodenum, unspecified: Secondary | ICD-10-CM | POA: Diagnosis not present

## 2017-12-18 DIAGNOSIS — K2950 Unspecified chronic gastritis without bleeding: Secondary | ICD-10-CM | POA: Diagnosis not present

## 2017-12-18 DIAGNOSIS — K6389 Other specified diseases of intestine: Secondary | ICD-10-CM | POA: Diagnosis not present

## 2017-12-18 DIAGNOSIS — K591 Functional diarrhea: Secondary | ICD-10-CM | POA: Diagnosis not present

## 2018-01-15 ENCOUNTER — Ambulatory Visit: Admit: 2018-01-15 | Payer: Medicare Other | Admitting: Gastroenterology

## 2018-01-15 SURGERY — ESOPHAGOGASTRODUODENOSCOPY (EGD) WITH PROPOFOL
Anesthesia: General

## 2018-01-28 ENCOUNTER — Ambulatory Visit: Payer: Self-pay | Admitting: Physician Assistant

## 2018-02-17 DIAGNOSIS — J329 Chronic sinusitis, unspecified: Secondary | ICD-10-CM | POA: Diagnosis not present

## 2018-02-17 DIAGNOSIS — E079 Disorder of thyroid, unspecified: Secondary | ICD-10-CM | POA: Diagnosis not present

## 2018-02-17 DIAGNOSIS — J45909 Unspecified asthma, uncomplicated: Secondary | ICD-10-CM | POA: Diagnosis not present

## 2018-02-17 DIAGNOSIS — M1990 Unspecified osteoarthritis, unspecified site: Secondary | ICD-10-CM | POA: Diagnosis not present

## 2018-02-17 DIAGNOSIS — R000 Tachycardia, unspecified: Secondary | ICD-10-CM | POA: Diagnosis not present

## 2018-02-17 DIAGNOSIS — K219 Gastro-esophageal reflux disease without esophagitis: Secondary | ICD-10-CM | POA: Diagnosis not present

## 2018-02-17 DIAGNOSIS — R42 Dizziness and giddiness: Secondary | ICD-10-CM | POA: Diagnosis not present

## 2018-02-17 DIAGNOSIS — K589 Irritable bowel syndrome without diarrhea: Secondary | ICD-10-CM | POA: Diagnosis not present

## 2018-02-17 DIAGNOSIS — R002 Palpitations: Secondary | ICD-10-CM | POA: Diagnosis not present

## 2018-02-19 ENCOUNTER — Ambulatory Visit (INDEPENDENT_AMBULATORY_CARE_PROVIDER_SITE_OTHER): Payer: Medicare Other | Admitting: Physician Assistant

## 2018-02-19 ENCOUNTER — Encounter: Payer: Self-pay | Admitting: Physician Assistant

## 2018-02-19 VITALS — BP 100/70 | HR 64 | Temp 97.7°F | Resp 16 | Ht 67.0 in | Wt 135.0 lb

## 2018-02-19 DIAGNOSIS — D72819 Decreased white blood cell count, unspecified: Secondary | ICD-10-CM

## 2018-02-19 DIAGNOSIS — M1990 Unspecified osteoarthritis, unspecified site: Secondary | ICD-10-CM | POA: Diagnosis not present

## 2018-02-19 DIAGNOSIS — R197 Diarrhea, unspecified: Secondary | ICD-10-CM | POA: Diagnosis not present

## 2018-02-19 DIAGNOSIS — R5383 Other fatigue: Secondary | ICD-10-CM

## 2018-02-19 NOTE — Progress Notes (Signed)
Patient: Kimberly Mercer, Female    DOB: [birth year], 72 y.o.   MRN: [id number] Visit Date: 02/19/2018  Today's Provider: Mar Daring, PA-C   Chief Complaint  Patient presents with  . New Patient (Initial Visit)   Subjective:    Patient here today transferring from Dr. Baldemar Lenis. Followed by Dr. Leafy Ro for GYN care. Followed by Dr. Clayborn Bigness for cardiology.  Patient reports she is having several chronic health problems. Patient reports she has been losing weight for about 1 year now. Patient reports she has IBS and has a very strict diet (Follows FODMAP diet). She has done this for the last 5-6 years and had maintained weight until recently.  Patient reports she has seen GI, Laurine Blazer, PA-C, and underwent and EGD and colonoscopy. She reports that she had gastric erosions and chronic inflammation, but otherwise unremarkable. She was started on Omeprazole 28m BID and zantac 1526mBID. She feels dissatisfied there because they did not explain much, put her on medication and said just follow up in 3 months. She reports this has helped the reflux type symptoms, but following the procedures she developed severe, explosive and unpredictable diarrhea. She denies any melena or hematochezia. This continued from when she had the procedures over a month ago until just two days ago, she had her first normal BM.   During these episodes she has had times when she will have multiple joints become inflamed, red, swollen and tender. It has occurred in her fingers, hands, feet, elbows mostly. She has not had any autoimmune work up. During this time over the last year she noticed her WBC count has also been slowly decreasing, going from normal range of 4.7 to most recently 2.9. She spoke with her previous PCP and feels he just shrugged it off.   She has also had worsening fatigue through this as well. 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 She also has been neglecting her house duties due to fatigue.   Review of Systems  Constitutional: Positive for fatigue and unexpected weight change.  HENT: Negative.   Eyes: Negative.   Respiratory: Negative.   Cardiovascular: Positive for palpitations.  Gastrointestinal: Positive for vomiting.  Endocrine: Negative.   Genitourinary: Negative.   Musculoskeletal: Negative.   Skin: Negative.   Allergic/Immunologic: Positive for environmental allergies.  Neurological: Negative.   Hematological: Negative.   Psychiatric/Behavioral: Negative.     Social History      She  reports that she has never smoked. She has never used smokeless tobacco. She reports that she does not drink alcohol or use drugs.       Social History   Socioeconomic History  . Marital status: Married    Spouse name: Not on file  . Number of children: 2  . Years of education: masters  . Highest education level: Not on file  Occupational History  . Occupation: Retired    Comment: teResearch officer, political party[employer]  . Financial resource strain: Not on file  . Food insecurity:    Worry: Not on file    Inability: Not on file  . Transportation needs:    Medical: Not on file    Non-medical: Not on file  Tobacco Use  . Smoking status: Never Smoker  . Smokeless tobacco: Never Used  Substance and Sexual Activity  . Alcohol use: No  . Drug use: No  .  Sexual activity: Not on file  Lifestyle  . Physical activity:    Days per week: Not on file    Minutes per session: Not on file  . Stress: Not on file  Relationships  . Social connections:    Talks on phone: Not on file    Gets together: Not on file    Attends religious service: Not on file    Active member of club or organization: Not on file    Attends meetings of clubs or organizations: Not on file    Relationship status: Not on file  Other Topics Concern  . Not on file  Social History Narrative   Always uses seat belts. Smoke alarm in the home. No  caffeine use. 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 Married x 15 years;3rd marriage. Happily married. Husband with bladder cancer and possible gastric cancer 2012-2013. Has 2 children, 2 grandchildren.    Past Medical History:  Diagnosis Date  . Allergic rhinitis, cause unspecified   . Allergy   . Asthma   . Benign neoplasm of colon   . Cancer ([place])   . Candidiasis of the esophagus   . Chicken pox   . Diverticulitis of colon (without mention of hemorrhage)(562.11)   . GERD (gastroesophageal reflux disease)   . Hemorrhoids    internal  . Measles   . Osteoarthrosis, unspecified whether generalized or localized, unspecified site   . Symptomatic menopausal or female climacteric states   . Unspecified asthma(493.90)   . Unspecified essential hypertension   . Unspecified hypothyroidism      Patient Active Problem List   Diagnosis Date Noted  . Menopause 07/06/2012  . Asthma 07/06/2012  . GERD (gastroesophageal reflux disease) 07/06/2012  . Allergic rhinitis 07/06/2012  . Varicose veins 07/06/2012    Past Surgical History:  Procedure Laterality Date  . cardiac catherization  12/09/2008   normal coranaries,normal EF.  . dilatation and curettage     due to SAB x 2  . thyroid nodule  1990   benign  . TOTAL ABDOMINAL HYSTERECTOMY W/ BILATERAL SALPINGOOPHORECTOMY  2003   complex endometrial hyperplasia, DUB, fibroids  . WISDOM TOOTH EXTRACTION      Family History        Family Status  Relation Name Status  . Mother  Deceased at age 15       cva  . Father  Deceased at age 1       CAD  . Sister  Alive  . Unknown Aunt (Not Specified)  . Brother  Alive  . Sister  Alive  . Sister  Alive        Her family history includes Cancer in her sister and unknown relative; Heart disease in her brother, father, and mother; Irritable bowel syndrome in her mother; Lung disease in her sister; Mental illness in her mother;  Parkinsonism in her father; Schizophrenia in her sister; Stroke in her mother.      Allergies  Allergen Reactions  . Claritin [Loratadine]     Panic attacks  . Fructose Diarrhea  . Gluten Meal Other (See Comments)    GI UPSET - CRAMPING  . Lactase Other (See Comments)    Pain, bloating abdominal pain  . Levaquin [Levofloxacin] Nausea Only    Yeast infection  . Sulfa Drugs Cross Reactors Hives     Current Outpatient Medications:  .  albuterol (PROAIR HFA) 108 (90 BASE) MCG/ACT inhaler, Inhale 2 puffs into the lungs  every 6 (six) hours as needed., Disp: , Rfl:  .  aspirin 81 MG tablet, Take 81 mg by mouth daily., Disp: , Rfl:  .  CALCIUM-MAGNESUIUM-ZINC 333-133-8.3 MG TABS, Take 3 tablets by mouth daily., Disp: , Rfl:  .  Cholecalciferol (VITAMIN D3) 1000 units CAPS, Take by mouth., Disp: , Rfl:  .  estradiol (VIVELLE-DOT) 0.075 MG/24HR, Place 1 patch onto the skin 2 (two) times a week., Disp: 8 patch, Rfl: 11 .  fluticasone (FLONASE) 50 MCG/ACT nasal spray, Place 2 sprays into the nose daily., Disp: 16 g, Rfl: 2 .  levothyroxine (SYNTHROID, LEVOTHROID) 75 MCG tablet, Take 75 mcg by mouth daily., Disp: , Rfl:  .  metoprolol succinate (TOPROL-XL) 25 MG 24 hr tablet, TAKE 1 TABLET BY MOUTH TWICE A DAY, Disp: , Rfl:  .  montelukast (SINGULAIR) 10 MG tablet, Take 10 mg by mouth daily., Disp: , Rfl:  .  Multiple Vitamin (MULTIVITAMIN) tablet, Take 1 tablet by mouth daily., Disp: , Rfl:  .  omeprazole (PRILOSEC OTC) 20 MG tablet, Take 40 mg by mouth daily. , Disp: , Rfl:  .  ranitidine (ZANTAC) 150 MG tablet, Take 1 tablet (150 mg total) by mouth 2 (two) times daily., Disp: 60 tablet, Rfl: 1   Patient Care Team: Mar Daring, PA-C as PCP - General (Family Medicine)      Objective:   Vitals: BP 100/70 (BP Location: Left Arm, Patient Position: Sitting, Cuff Size: Normal)   Pulse 64   Temp 97.7 F (36.5 C) (Oral)   Resp 16   Ht '5\' 7"'  (1.702 m)   Wt 135 lb (61.2 kg)   SpO2  98%   BMI 21.14 kg/m    Vitals:   02/19/18 1504  BP: 100/70  Pulse: 64  Resp: 16  Temp: 97.7 F (36.5 C)  TempSrc: Oral  SpO2: 98%  Weight: 135 lb (61.2 kg)  Height: '5\' 7"'  (1.702 m)     Physical Exam  Constitutional: She is oriented to person, place, and time. She appears well-developed and well-nourished. No distress.  Neck: Normal range of motion. Neck supple. No JVD present. No tracheal deviation present. No thyromegaly present.  Cardiovascular: Normal rate, regular rhythm and normal heart sounds. Exam reveals no gallop and no friction rub.  No murmur heard. Pulmonary/Chest: Effort normal and breath sounds normal. No respiratory distress. She has no wheezes. She has no rales.  Abdominal: Soft. Normal appearance and bowel sounds are normal. She exhibits no distension and no mass. There is no hepatosplenomegaly. There is no tenderness. There is no rebound, no guarding and no CVA tenderness.  Musculoskeletal: Normal range of motion. She exhibits no edema.  Lymphadenopathy:    She has no cervical adenopathy.  Neurological: She is alert and oriented to person, place, and time.  Skin: Skin is warm and dry. She is not diaphoretic.  Psychiatric: She has a normal mood and affect. Her behavior is normal. Judgment and thought content normal.  Vitals reviewed.   Depression Screen PHQ 2/9 Scores 02/19/2018  PHQ - 2 Score 0  PHQ- 9 Score 0      Assessment & Plan:     Routine Health Maintenance and Physical Exam  Exercise Activities and Dietary recommendations Goals    None      Immunization History  Administered Date(s) Administered  . Influenza Split 07/06/2012  . Influenza, High Dose Seasonal PF 08/22/2017  . Influenza-Unspecified 06/22/2011, 08/19/2013, 09/20/2014, 08/07/2015, 08/27/2016, 08/22/2017  . Pneumococcal Polysaccharide-23 05/01/2012  .  Pneumococcal-Unspecified 07/26/2009  . Td 10/21/2001  . Tdap 12/24/2010  . Zoster 05/01/2013    Health Maintenance    Topic Date Due  . Hepatitis C Screening  05-10-1946  . DEXA SCAN  03/26/2013  . PNA vac Low Risk Adult (1 of 2 - PCV13) 03/26/2013  . MAMMOGRAM  12/25/2013  . INFLUENZA VACCINE  05/21/2018  . COLONOSCOPY  10/13/2019  . TETANUS/TDAP  12/23/2020     Discussed health benefits of physical activity, and encouraged her to engage in regular exercise appropriate for her age and condition.    1. Fatigue, unspecified type Unsure of source. Sounds autoimmune possibly. Will check labs as below. I will f/u pending results and will refer to Rheum if needed. Referral placed to Dr. Marius Ditch at patient request for 2nd opinion. Patient concerned about IBD and celiac (however, patient already avoids gluten and dairy). I will attempt to request procedure notes for EGD and colonoscopy from [facility] with Dr. Vira Agar. Tor Netters notes are available in Epic.  - CBC w/Diff/Platelet - ANA,IFA RA Diag Pnl w/rflx Tit/Patn - Sed Rate (ESR) - C-reactive protein - Ambulatory referral to Gastroenterology  2. Joint inflammation See above medical treatment plan. - CBC w/Diff/Platelet - ANA,IFA RA Diag Pnl w/rflx Tit/Patn - Sed Rate (ESR) - C-reactive protein - Ambulatory referral to Gastroenterology  3. Diarrhea, unspecified type See above medical treatment plan.  - CBC w/Diff/Platelet - ANA,IFA RA Diag Pnl w/rflx Tit/Patn - Sed Rate (ESR) - C-reactive protein - Ambulatory referral to Gastroenterology  4. Leukopenia, unspecified type Unsure of cause. Possibly stress related. No acute and frequent illnesses. Will check labs as below and f/u pending results. If needed will refer to hematology for further evaluation.  - CBC w/Diff/Platelet - ANA,IFA RA Diag Pnl w/rflx Tit/Patn - Sed Rate (ESR) - C-reactive protein - Ambulatory referral to Gastroenterology  --------------------------------------------------------------------    Mar Daring, PA-C  [facility]

## 2018-02-20 ENCOUNTER — Encounter: Payer: Self-pay | Admitting: Physician Assistant

## 2018-02-23 ENCOUNTER — Other Ambulatory Visit: Payer: Self-pay

## 2018-02-23 ENCOUNTER — Other Ambulatory Visit
Admission: RE | Admit: 2018-02-23 | Discharge: 2018-02-23 | Disposition: A | Discharge status: Home / Self Care | Payer: Medicare Other | Source: Ambulatory Visit | Attending: Gastroenterology | Admitting: Gastroenterology

## 2018-02-23 ENCOUNTER — Encounter: Payer: Self-pay | Admitting: Gastroenterology

## 2018-02-23 ENCOUNTER — Ambulatory Visit (INDEPENDENT_AMBULATORY_CARE_PROVIDER_SITE_OTHER): Payer: Medicare Other | Admitting: Gastroenterology

## 2018-02-23 VITALS — BP 94/58 | HR 74 | Resp 16 | Ht 68.4 in | Wt 135.6 lb

## 2018-02-23 DIAGNOSIS — R197 Diarrhea, unspecified: Secondary | ICD-10-CM | POA: Diagnosis not present

## 2018-02-23 DIAGNOSIS — K582 Mixed irritable bowel syndrome: Secondary | ICD-10-CM | POA: Diagnosis not present

## 2018-02-23 DIAGNOSIS — R634 Abnormal weight loss: Secondary | ICD-10-CM

## 2018-02-23 LAB — CBC WITH DIFFERENTIAL/PLATELET
BASOS: 1 %
Basophils Absolute: 0 10*3/uL (ref 0.0–0.2)
EOS (ABSOLUTE): 0.1 10*3/uL (ref 0.0–0.4)
Eos: 2 %
Hematocrit: 43.4 % (ref 34.0–46.6)
Hemoglobin: 14.9 g/dL (ref 11.1–15.9)
Immature Grans (Abs): 0 10*3/uL (ref 0.0–0.1)
Immature Granulocytes: 0 %
LYMPHS ABS: 1.6 10*3/uL (ref 0.7–3.1)
Lymphs: 39 %
MCH: 31.6 pg (ref 26.6–33.0)
MCHC: 34.3 g/dL (ref 31.5–35.7)
MCV: 92 fL (ref 79–97)
MONOS ABS: 0.6 10*3/uL (ref 0.1–0.9)
Monocytes: 13 %
NEUTROS PCT: 45 %
Neutrophils Absolute: 1.9 10*3/uL (ref 1.4–7.0)
PLATELETS: 275 10*3/uL (ref 150–379)
RBC: 4.71 x10E6/uL (ref 3.77–5.28)
RDW: 12.8 % (ref 12.3–15.4)
WBC: 4.2 10*3/uL (ref 3.4–10.8)

## 2018-02-23 LAB — C-REACTIVE PROTEIN: CRP: 0.4 mg/L (ref 0.0–4.9)

## 2018-02-23 LAB — ANA,IFA RA DIAG PNL W/RFLX TIT/PATN
ANA Titer 1: NEGATIVE
CYCLIC CITRULLIN PEPTIDE AB: 17 U (ref 0–19)
Rhuematoid fact SerPl-aCnc: <10 IU/mL (ref 0.0–13.9)

## 2018-02-23 LAB — SEDIMENTATION RATE: Sed Rate: 2 mm/hr (ref 0–40)

## 2018-02-23 MED ORDER — AMITRIPTYLINE HCL 10 MG PO TABS: 10.0000 mg | ORAL_TABLET | Freq: Every day | ORAL | 0 refills | Status: DC

## 2018-02-23 NOTE — Progress Notes (Signed)
[name], MD [address]  [place]  [place], [place] [address]  Main: [phone]  Fax: [phone]    Gastroenterology Consultation  Referring Provider:     Florian Buff* Primary Care Physician:  Mar Daring, PA-C Primary Gastroenterologist:  Dr. Gustavo Lah Reason for Consultation:     Weight loss, altered bowel habits        HPI:   Kimberly Mercer is a 72 y.o. female referred by Dr. [name], [name], PA-C  for consultation & management of and weight loss and altered bowel habits. Patient reports that she has about 30+ years history of irritable bowel syndrome with alternating episodes of constipation and diarrhea, abdominal bloating. Over the years, she has been maintaining gluten-free diet, lactose-free diet and low FODMAPs which have been helping her controlling IBS symptoms. However, since 07/2017 she has been dealing with flareup of constellation of GI symptoms including diarrhea, constipation and abdominal bloating. Her symptoms started of with severe right sided shoulder pain associated with low back pain, followed by GI symptoms. She also lost significant weight up to 20 pounds over the past 40 years due to the ongoing GI symptoms, dietary restriction and decreased appetite. She has been experiencing severe arthralgias involving small and large joint symptoms with stiffness and severe restriction in her daily activities. She reports swelling and redness in her bilateral hand joints. She said she had to take Tylenol for almost a month. Due to the above symptoms, patient started taking CBD oil, initially started with 2 times daily, and she decreased to once daily in the morning as she could not tolerate higher dose. She has been able to sleep well since then. For the last month or so, her weight has been stable and she gained a few pounds back. Due to ongoing weight loss, she initially saw GI NP at [facility] clinic, she underwent ultrasound RUQ and HIDA  scan which were normal. Subsequently, she underwent EGD and colonoscopy by Dr. Alice Reichert and she was told that she had erosions in stomach and was started on Prilosec. I do not have reports available with me today. She said she could not wait longer for follow-up appointment to see GI at [facility] clinic. Therefore, decided to switch her care to  GI.   She also has been dealing with stress in her personal life, taking care of her husband who had third round of prostate cancer. 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 Alice Reichert in 11/2017 Reports not available Colonoscopy in 09/2009 Diverticulosis and internal hemorrhoids  Past Medical History:  Diagnosis Date  . Allergic rhinitis, cause unspecified   . Allergy   . Asthma   . Benign neoplasm of colon   . Cancer ([place])   . Candidiasis of the esophagus   . Chicken pox   . Diverticulitis of colon (without mention of hemorrhage)(562.11)   . GERD (gastroesophageal reflux disease)   . Hemorrhoids    internal  . Measles   . Osteoarthrosis, unspecified whether generalized or localized, unspecified site   . Symptomatic menopausal or female climacteric states   . Unspecified asthma(493.90)   . Unspecified essential hypertension   . Unspecified hypothyroidism     Past Surgical History:  Procedure Laterality Date  . cardiac catherization  12/09/2008   normal coranaries,normal EF.  . dilatation and curettage     due to SAB  x 2  . thyroid nodule  1990   benign  . TOTAL ABDOMINAL HYSTERECTOMY W/ BILATERAL SALPINGOOPHORECTOMY  2003   complex endometrial hyperplasia, DUB, fibroids  . WISDOM TOOTH EXTRACTION      Current Outpatient Medications:  .  albuterol (PROAIR HFA) 108 (90 BASE) MCG/ACT inhaler, Inhale 2 puffs into the lungs every 6 (six) hours as needed., Disp: , Rfl:  .  amitriptyline  (ELAVIL) 10 MG tablet, Take 1 tablet (10 mg total) by mouth at bedtime., Disp: 30 tablet, Rfl: 0 .  aspirin 81 MG tablet, Take 81 mg by mouth daily., Disp: , Rfl:  .  CALCIUM-MAGNESUIUM-ZINC 333-133-8.3 MG TABS, Take 3 tablets by mouth daily., Disp: , Rfl:  .  Cholecalciferol (VITAMIN D3) 1000 units CAPS, Take by mouth., Disp: , Rfl:  .  estradiol (VIVELLE-DOT) 0.075 MG/24HR, Place 1 patch onto the skin 2 (two) times a week., Disp: 8 patch, Rfl: 11 .  fluticasone (FLONASE) 50 MCG/ACT nasal spray, Place 2 sprays into the nose daily., Disp: 16 g, Rfl: 2 .  levothyroxine (SYNTHROID, LEVOTHROID) 75 MCG tablet, Take 75 mcg by mouth daily., Disp: , Rfl:  .  metoprolol succinate (TOPROL-XL) 25 MG 24 hr tablet, TAKE 1 TABLET BY MOUTH TWICE A DAY, Disp: , Rfl:  .  montelukast (SINGULAIR) 10 MG tablet, Take 10 mg by mouth daily., Disp: , Rfl:  .  Multiple Vitamin (MULTIVITAMIN) tablet, Take 1 tablet by mouth daily., Disp: , Rfl:  .  omeprazole (PRILOSEC OTC) 20 MG tablet, Take 40 mg by mouth daily. , Disp: , Rfl:  .  omeprazole (PRILOSEC) 40 MG capsule, TAKE 1 CAPSULE (40 MG TOTAL) BY MOUTH ONCE DAILY TAKE 15-20 MINS BEFORE MEALS, Disp: , Rfl: 3 .  ranitidine (ZANTAC) 150 MG tablet, Take 1 tablet (150 mg total) by mouth 2 (two) times daily., Disp: 60 tablet, Rfl: 1    Family History  Problem Relation Age of Onset  . Heart disease Mother        CAD  . Stroke Mother        x 2  . Mental illness Mother        not diagnosed  . Irritable bowel syndrome Mother   . Heart disease Father        CAD  . Parkinsonism Father   . Cancer Sister        Breast  . Cancer Unknown        Breast and Ovarian  . Heart disease Brother   . Schizophrenia Sister   . Lung disease Sister      Social History   Tobacco Use  . Smoking status: Never Smoker  . Smokeless tobacco: Never Used  Substance Use Topics  . Alcohol use: No  . Drug use: No    Allergies as of 02/23/2018 - Review Complete 02/23/2018    Allergen Reaction Noted  . Claritin [loratadine]  07/04/2012  . Fructose Diarrhea 03/12/2016  . Gluten meal Other (See Comments) 09/23/2012  . Lactase Other (See Comments) 02/23/2014  . Levaquin [levofloxacin] Nausea Only 07/04/2012  . Sulfa drugs cross reactors Hives 07/04/2012    Review of Systems:    All systems reviewed and negative except where noted in HPI.   Physical Exam:  BP (!) 94/58 (BP Location: Left Arm, Patient Position: Sitting, Cuff Size: Normal)   Pulse 74   Resp 16   Ht 5' 8.4" (1.737 m)   Wt 135 lb 9.6 oz (61.5 kg)   BMI 20.38  kg/m  No LMP recorded. Patient has had a hysterectomy.  General:   Alert,  Well-developed, well-nourished, pleasant and cooperative in NAD Head:  Normocephalic and atraumatic. Eyes:  Sclera clear, no icterus.   Conjunctiva pink. Ears:  Normal auditory acuity. Nose:  No deformity, discharge, or lesions. Mouth:  No deformity or lesions,oropharynx pink & moist. Neck:  Supple; no masses or thyromegaly. Lungs:  Respirations even and unlabored.  Clear throughout to auscultation.   No wheezes, crackles, or rhonchi. No acute distress. Heart:  Regular rate and rhythm; no murmurs, clicks, rubs, or gallops. Abdomen:  Normal bowel sounds. Soft, non-tender and non-distended without masses, hepatosplenomegaly or hernias noted.  No guarding or rebound tenderness.   Rectal: Not performed Msk:  Symmetrical, mild redness and deformities in interphalangeal joints of bilateral hands. Good, equal movement & strength bilaterally. Pulses:  Normal pulses noted. Extremities:  No clubbing or edema.  No cyanosis. Neurologic:  Alert and oriented x3;  grossly normal neurologically. Skin:  Intact without significant lesions or rashes. No jaundice. Lymph Nodes:  No significant cervical adenopathy. Psych:  Alert and cooperative. Normal mood and affect.  Imaging Studies: Abdominal imaging reviewed  Assessment and Plan:   SURAIYA DICKERSON is a 72 y.o. Caucasian  female with hypothyroidism, on levothyroxine, long-standing history of abdominal bloating,alternating episodes of constipation and diarrhea is seen in consultation for worsening of her GI symptoms and progressive weight loss. Her GI symptoms for a under controlled on routine free diet, lactose-free diet and low FODMAPs. Workup so far revealed normal CRP, negative ANA, reportedly unremarkable EGD and colonoscopy, normal ultrasound and HIDA scan. Her symptoms are most likely in the setting of stress, however recommend to rule out any other underlying pathology.  - Recommend stool studies to rule out infection - Fecal calprotectin level - Serum TTG IgA - obtain EGD and colonoscopy reports, pathology reports from Dr Ricky Stabs office - Advised her to try amitriptyline 10 mg at bedtime, and increase the dose if able to tolerate - consider referral to rheumatology for further evaluation of her arthritis symptoms   Cc notes to Fenton Malling, PA-C   Follow up in 4 weeks   [name], MD

## 2018-02-24 ENCOUNTER — Other Ambulatory Visit: Payer: Self-pay | Admitting: Gastroenterology

## 2018-02-24 ENCOUNTER — Telehealth: Payer: Self-pay | Admitting: Physician Assistant

## 2018-02-24 ENCOUNTER — Encounter: Payer: Self-pay | Admitting: Physician Assistant

## 2018-02-24 ENCOUNTER — Telehealth: Payer: Self-pay

## 2018-02-24 ENCOUNTER — Ambulatory Visit (INDEPENDENT_AMBULATORY_CARE_PROVIDER_SITE_OTHER): Payer: Medicare Other | Admitting: Physician Assistant

## 2018-02-24 ENCOUNTER — Other Ambulatory Visit: Payer: Self-pay

## 2018-02-24 ENCOUNTER — Other Ambulatory Visit
Admission: RE | Admit: 2018-02-24 | Discharge: 2018-02-24 | Disposition: A | Discharge status: Home / Self Care | Payer: Medicare Other | Source: Ambulatory Visit | Attending: Gastroenterology | Admitting: Gastroenterology

## 2018-02-24 VITALS — BP 98/68 | Temp 98.4°F | Wt 136.0 lb

## 2018-02-24 DIAGNOSIS — Z882 Allergy status to sulfonamides status: Secondary | ICD-10-CM | POA: Diagnosis not present

## 2018-02-24 DIAGNOSIS — Z820 Family history of epilepsy and other diseases of the nervous system: Secondary | ICD-10-CM | POA: Diagnosis not present

## 2018-02-24 DIAGNOSIS — R197 Diarrhea, unspecified: Secondary | ICD-10-CM | POA: Diagnosis not present

## 2018-02-24 DIAGNOSIS — K219 Gastro-esophageal reflux disease without esophagitis: Secondary | ICD-10-CM | POA: Diagnosis not present

## 2018-02-24 DIAGNOSIS — R140 Abdominal distension (gaseous): Secondary | ICD-10-CM | POA: Insufficient documentation

## 2018-02-24 DIAGNOSIS — R300 Dysuria: Secondary | ICD-10-CM

## 2018-02-24 DIAGNOSIS — Z888 Allergy status to other drugs, medicaments and biological substances status: Secondary | ICD-10-CM | POA: Diagnosis not present

## 2018-02-24 DIAGNOSIS — R634 Abnormal weight loss: Secondary | ICD-10-CM | POA: Insufficient documentation

## 2018-02-24 DIAGNOSIS — Z7951 Long term (current) use of inhaled steroids: Secondary | ICD-10-CM | POA: Insufficient documentation

## 2018-02-24 DIAGNOSIS — Z803 Family history of malignant neoplasm of breast: Secondary | ICD-10-CM | POA: Insufficient documentation

## 2018-02-24 DIAGNOSIS — E039 Hypothyroidism, unspecified: Secondary | ICD-10-CM | POA: Diagnosis not present

## 2018-02-24 DIAGNOSIS — Z79890 Hormone replacement therapy (postmenopausal): Secondary | ICD-10-CM | POA: Diagnosis not present

## 2018-02-24 DIAGNOSIS — I10 Essential (primary) hypertension: Secondary | ICD-10-CM | POA: Diagnosis not present

## 2018-02-24 DIAGNOSIS — Z79899 Other long term (current) drug therapy: Secondary | ICD-10-CM | POA: Diagnosis not present

## 2018-02-24 DIAGNOSIS — Z881 Allergy status to other antibiotic agents status: Secondary | ICD-10-CM | POA: Diagnosis not present

## 2018-02-24 DIAGNOSIS — Z8249 Family history of ischemic heart disease and other diseases of the circulatory system: Secondary | ICD-10-CM | POA: Diagnosis not present

## 2018-02-24 DIAGNOSIS — M2550 Pain in unspecified joint: Secondary | ICD-10-CM

## 2018-02-24 DIAGNOSIS — Z8379 Family history of other diseases of the digestive system: Secondary | ICD-10-CM | POA: Insufficient documentation

## 2018-02-24 DIAGNOSIS — Z86010 Personal history of colonic polyps: Secondary | ICD-10-CM | POA: Insufficient documentation

## 2018-02-24 DIAGNOSIS — A498 Other bacterial infections of unspecified site: Secondary | ICD-10-CM

## 2018-02-24 DIAGNOSIS — Z6820 Body mass index (BMI) 20.0-20.9, adult: Secondary | ICD-10-CM | POA: Diagnosis not present

## 2018-02-24 DIAGNOSIS — N3000 Acute cystitis without hematuria: Secondary | ICD-10-CM

## 2018-02-24 DIAGNOSIS — J45909 Unspecified asthma, uncomplicated: Secondary | ICD-10-CM | POA: Diagnosis not present

## 2018-02-24 DIAGNOSIS — Z7982 Long term (current) use of aspirin: Secondary | ICD-10-CM | POA: Diagnosis not present

## 2018-02-24 DIAGNOSIS — Z823 Family history of stroke: Secondary | ICD-10-CM | POA: Diagnosis not present

## 2018-02-24 LAB — POCT URINALYSIS DIPSTICK
Bilirubin, UA: NEGATIVE
GLUCOSE UA: NEGATIVE
Ketones, UA: NEGATIVE
NITRITE UA: NEGATIVE
Protein, UA: NEGATIVE
Spec Grav, UA: 1.01 (ref 1.010–1.025)
UROBILINOGEN UA: 0.2 U/dL
pH, UA: 5 (ref 5.0–8.0)

## 2018-02-24 LAB — GASTROINTESTINAL PANEL BY PCR, STOOL (REPLACES STOOL CULTURE)
Adenovirus F40/41: NOT DETECTED
Astrovirus: NOT DETECTED
CYCLOSPORA CAYETANENSIS: NOT DETECTED
Campylobacter species: NOT DETECTED
Cryptosporidium: NOT DETECTED
ENTAMOEBA HISTOLYTICA: NOT DETECTED
Enteroaggregative E coli (EAEC): NOT DETECTED
Enteropathogenic E coli (EPEC): DETECTED — AB
Enterotoxigenic E coli (ETEC): NOT DETECTED
Giardia lamblia: NOT DETECTED
Norovirus GI/GII: NOT DETECTED
PLESIMONAS SHIGELLOIDES: NOT DETECTED
Rotavirus A: NOT DETECTED
SALMONELLA SPECIES: NOT DETECTED
SAPOVIRUS (I, II, IV, AND V): NOT DETECTED
SHIGA LIKE TOXIN PRODUCING E COLI (STEC): NOT DETECTED
Shigella/Enteroinvasive E coli (EIEC): NOT DETECTED
VIBRIO SPECIES: NOT DETECTED
Vibrio cholerae: NOT DETECTED
YERSINIA ENTEROCOLITICA: NOT DETECTED

## 2018-02-24 LAB — GLIA (IGA/G) + TTG IGA
Antigliadin Abs, IgA: 7 units (ref 0–19)
Gliadin IgG: 3 units (ref 0–19)

## 2018-02-24 LAB — C DIFFICILE QUICK SCREEN W PCR REFLEX
C DIFFICILE (CDIFF) INTERP: NOT DETECTED
C Diff antigen: NEGATIVE
C Diff toxin: NEGATIVE

## 2018-02-24 MED ORDER — NITROFURANTOIN MONOHYD MACRO 100 MG PO CAPS: 100.0000 mg | ORAL_CAPSULE | Freq: Two times a day (BID) | ORAL | 0 refills | Status: DC

## 2018-02-24 MED ORDER — FLUCONAZOLE 200 MG PO TABS: 200.0000 mg | ORAL_TABLET | Freq: Every day | ORAL | 0 refills | Status: AC

## 2018-02-24 MED ORDER — CIPROFLOXACIN HCL 500 MG PO TABS: 500.0000 mg | ORAL_TABLET | Freq: Two times a day (BID) | ORAL | 0 refills | Status: AC

## 2018-02-24 NOTE — Telephone Encounter (Signed)
Patient has been informed of results and informed of rxs sent to pharmacy.  She stated that she developed a UTI and Fenton Malling prescribed Nitrofuratonin.  She is concerned about taking 2 antibiotics.  She said she will let us know if she needs anything changed.  Thanks [employer]

## 2018-02-24 NOTE — Progress Notes (Signed)
Patient: Kimberly Mercer Female    DOB: 01-Aug-1946   72 y.o.   MRN: [id number] Visit Date: 02/24/2018  Today's Provider: Mar Daring, PA-C   Chief Complaint  Patient presents with  . Dysuria    monday   Subjective:    Dysuria   This is a new problem. The current episode started yesterday. The problem occurs every urination. The problem has been rapidly worsening. The quality of the pain is described as aching and burning. There has been no fever. Associated symptoms include chills, hematuria and urgency. She has tried antibiotics (Took a 500mg  amoxicillian) for the symptoms. The treatment provided no relief. Her past medical history is significant for recurrent UTIs.      Allergies  Allergen Reactions  . Claritin [Loratadine]     Panic attacks  . Fructose Diarrhea  . Gluten Meal Other (See Comments)    GI UPSET - CRAMPING  . Lactase Other (See Comments)    Pain, bloating abdominal pain  . Levaquin [Levofloxacin] Nausea Only    Yeast infection  . Sulfa Drugs Cross Reactors Hives     Current Outpatient Medications:  .  albuterol (PROAIR HFA) 108 (90 BASE) MCG/ACT inhaler, Inhale 2 puffs into the lungs every 6 (six) hours as needed., Disp: , Rfl:  .  amitriptyline (ELAVIL) 10 MG tablet, Take 1 tablet (10 mg total) by mouth at bedtime., Disp: 30 tablet, Rfl: 0 .  aspirin 81 MG tablet, Take 81 mg by mouth daily., Disp: , Rfl:  .  CALCIUM-MAGNESUIUM-ZINC 333-133-8.3 MG TABS, Take 3 tablets by mouth daily., Disp: , Rfl:  .  Cholecalciferol (VITAMIN D3) 1000 units CAPS, Take by mouth., Disp: , Rfl:  .  estradiol (VIVELLE-DOT) 0.075 MG/24HR, Place 1 patch onto the skin 2 (two) times a week., Disp: 8 patch, Rfl: 11 .  fluticasone (FLONASE) 50 MCG/ACT nasal spray, Place 2 sprays into the nose daily., Disp: 16 g, Rfl: 2 .  levothyroxine (SYNTHROID, LEVOTHROID) 75 MCG tablet, Take 75 mcg by mouth daily., Disp: , Rfl:  .  metoprolol succinate (TOPROL-XL) 25 MG 24 hr tablet,  TAKE 1 TABLET BY MOUTH TWICE A DAY, Disp: , Rfl:  .  montelukast (SINGULAIR) 10 MG tablet, Take 10 mg by mouth daily., Disp: , Rfl:  .  Multiple Vitamin (MULTIVITAMIN) tablet, Take 1 tablet by mouth daily., Disp: , Rfl:  .  omeprazole (PRILOSEC OTC) 20 MG tablet, Take 40 mg by mouth daily. , Disp: , Rfl:  .  omeprazole (PRILOSEC) 40 MG capsule, TAKE 1 CAPSULE (40 MG TOTAL) BY MOUTH ONCE DAILY TAKE 15-20 MINS BEFORE MEALS, Disp: , Rfl: 3 .  ranitidine (ZANTAC) 150 MG tablet, Take 1 tablet (150 mg total) by mouth 2 (two) times daily., Disp: 60 tablet, Rfl: 1  Review of Systems  Constitutional: Positive for chills.  Respiratory: Negative.   Cardiovascular: Negative.   Gastrointestinal: Positive for abdominal pain.  Genitourinary: Positive for dysuria, hematuria and urgency.  Neurological: Positive for dizziness.    Social History   Tobacco Use  . Smoking status: Never Smoker  . Smokeless tobacco: Never Used  Substance Use Topics  . Alcohol use: No   Objective:   Wt 136 lb (61.7 kg)   BMI 20.44 kg/m  Vitals:   02/24/18 1049  Weight: 136 lb (61.7 kg)     Physical Exam  Constitutional: She is oriented to person, place, and time. She appears well-developed and well-nourished. No distress.  Cardiovascular:  Normal rate, regular rhythm and normal heart sounds. Exam reveals no gallop and no friction rub.  No murmur heard. Pulmonary/Chest: Effort normal and breath sounds normal. No respiratory distress. She has no wheezes. She has no rales.  Abdominal: Soft. Normal appearance and bowel sounds are normal. She exhibits no distension and no mass. There is no hepatosplenomegaly. There is tenderness in the suprapubic area. There is no rebound, no guarding and no CVA tenderness.  Neurological: She is alert and oriented to person, place, and time.  Skin: Skin is warm and dry. She is not diaphoretic.        Assessment & Plan:     1. Dysuria Worsening symptoms. UA positive. Will treat  empirically with Macrobid. Continue to push fluids. Urine sent for culture. Will follow up pending C&S results. She is to call if symptoms do not improve or if they worsen.  - POCT urinalysis dipstick - CULTURE, URINE COMPREHENSIVE  2. Arthralgia, unspecified joint Continued multiple inflamed joints with pain and swelling, hands are worst. - Ambulatory referral to Rheumatology  3. Gastroesophageal reflux disease without esophagitis Going to try husband's pantoprazole, offered her own Rx but she declined currently. Stop omeprazole while taking pantoprazole. Call if she wants her own Rx.   4. Acute cystitis without hematuria See above medical treatment plan. - nitrofurantoin, macrocrystal-monohydrate, (MACROBID) 100 MG capsule; Take 1 capsule (100 mg total) by mouth 2 (two) times daily.  Dispense: 20 capsule; Refill: 0   *Addened: Patient called and informed she had E.Coli positive stools and was placed on Cipro. Macrobid was stopped and will continue Cipro only unless resistant on culture.       Mar Daring, PA-C  [facility]

## 2018-02-24 NOTE — Telephone Encounter (Signed)
Laquisha in lab notified me that patients has positive E-Coli Enteropathogenic.

## 2018-02-24 NOTE — Telephone Encounter (Signed)
Pt was in this morning and seen Kimberly Mercer for UTI.  She had filled the antibiotic that she got this am but she was also seeing Dr Marius Ditch with [facility] and diagnosed with E coli today and prescribed Cipro.  She wants to if she can take both or does she need to just take one antibiotic  Pt's call back Is [phone]  Thanks teri

## 2018-02-25 NOTE — Telephone Encounter (Signed)
Responded in [place] message to patient

## 2018-02-26 ENCOUNTER — Encounter: Payer: Self-pay | Admitting: Physician Assistant

## 2018-02-26 LAB — CULTURE, URINE COMPREHENSIVE

## 2018-02-28 LAB — CALPROTECTIN, FECAL: CALPROTECTIN, FECAL: 425 ug/g — AB (ref 0–120)

## 2018-03-04 ENCOUNTER — Encounter: Payer: Self-pay | Admitting: Internal Medicine

## 2018-03-19 DIAGNOSIS — H40033 Anatomical narrow angle, bilateral: Secondary | ICD-10-CM | POA: Diagnosis not present

## 2018-03-19 DIAGNOSIS — H2513 Age-related nuclear cataract, bilateral: Secondary | ICD-10-CM | POA: Diagnosis not present

## 2018-03-20 DIAGNOSIS — H43393 Other vitreous opacities, bilateral: Secondary | ICD-10-CM | POA: Diagnosis not present

## 2018-03-24 ENCOUNTER — Ambulatory Visit (INDEPENDENT_AMBULATORY_CARE_PROVIDER_SITE_OTHER): Payer: Medicare Other | Admitting: Gastroenterology

## 2018-03-24 ENCOUNTER — Encounter: Payer: Self-pay | Admitting: Gastroenterology

## 2018-03-24 VITALS — BP 112/67 | HR 55 | Resp 16 | Ht 68.0 in | Wt 137.8 lb

## 2018-03-24 DIAGNOSIS — A498 Other bacterial infections of unspecified site: Secondary | ICD-10-CM

## 2018-03-24 DIAGNOSIS — K580 Irritable bowel syndrome with diarrhea: Secondary | ICD-10-CM | POA: Diagnosis not present

## 2018-03-24 DIAGNOSIS — K219 Gastro-esophageal reflux disease without esophagitis: Secondary | ICD-10-CM

## 2018-03-24 NOTE — Progress Notes (Signed)
[name], MD [address]  [place]  [place], [place] [address]  Main: [phone]  Fax: [phone]    Gastroenterology Consultation  Referring Provider:     Florian Buff* Primary Care Physician:  Mar Daring, PA-C Primary Gastroenterologist:  Dr. Gustavo Lah Reason for Consultation:     Weight loss, altered bowel habits        HPI:   Kimberly Mercer is a 72 y.o. female referred by Dr. [name], [name], PA-C  for consultation & management of and weight loss and altered bowel habits. Patient reports that she has about 30+ years history of irritable bowel syndrome with alternating episodes of constipation and diarrhea, abdominal bloating. Over the years, she has been maintaining gluten-free diet, lactose-free diet and low FODMAPs which have been helping her controlling IBS symptoms. However, since 07/2017 she has been dealing with flareup of constellation of GI symptoms including diarrhea, constipation and abdominal bloating. Her symptoms started of with severe right sided shoulder pain associated with low back pain, followed by GI symptoms. She also lost significant weight up to 20 pounds over the past 40 years due to the ongoing GI symptoms, dietary restriction and decreased appetite. She has been experiencing severe arthralgias involving small and large joint symptoms with stiffness and severe restriction in her daily activities. She reports swelling and redness in her bilateral hand joints. She said she had to take Tylenol for almost a month. Due to the above symptoms, patient started taking CBD oil, initially started with 2 times daily, and she decreased to once daily in the morning as she could not tolerate higher dose. She has been able to sleep well since then. For the last month or so, her weight has been stable and she gained a few pounds back. Due to ongoing weight loss, she initially saw GI NP at [facility], she underwent ultrasound RUQ and HIDA  scan which were normal. Subsequently, she underwent EGD and colonoscopy by Dr. Alice Reichert and she was told that she had erosions in stomach and was started on Prilosec. I do not have reports available with me today. She said she could not wait longer for follow-up appointment to see GI at [facility] clinic. Therefore, decided to switch her care to [facility].   Follow up visit 03/24/2018 I reviewed her recent endoscopy reports from Dr. Alice Reichert. There was no evidence of H. Pylori on gastric biopsies. Duodenal biopsies were not performed. 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 Other workup included celiac serologies which were negative, ESR, CRP negative, she had stool studies came up positive for Escherichia coli infection as well as elevated fecal calprotectin levels to 425. I treated her with 2 weeks course of ciprofloxacin. Patient reports remarkable improvement in symptoms within 2 days after starting ciprofloxacin. She is no longer having diarrhea, her appetite improved. She had 2 days of constipation but feels like her bowel movements are getting back regular now. She gained 2 pounds. She is more active, denies fatigue, able to take care of her horses throughout the day. 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 She has referral to rheumatology and waiting for appointment.  she continues to have heartburn and epigastric pain, currently on Prilosec 40 mg in the morning, ranitidine 150 mg twice daily. She has not started amitriptyline  NSAIDs: occasional ibuprofen use  Antiplts/Anticoagulants/Anti thrombotics: none  GI Procedures: EGD and colonoscopy by Dr. Alice Reichert  in 11/2017    Colonoscopy 12/18/2017    Colonoscopy in 09/2009 Diverticulosis and internal hemorrhoids  Past Medical History:  Diagnosis Date  . Allergic rhinitis, cause unspecified   . Allergy   . Asthma   . Benign neoplasm  of colon   . Cancer ([place])   . Candidiasis of the esophagus   . Chicken pox   . Diverticulitis of colon (without mention of hemorrhage)(562.11)   . GERD (gastroesophageal reflux disease)   . Hemorrhoids    internal  . Measles   . Osteoarthrosis, unspecified whether generalized or localized, unspecified site   . Symptomatic menopausal or female climacteric states   . Unspecified asthma(493.90)   . Unspecified essential hypertension   . Unspecified hypothyroidism     Past Surgical History:  Procedure Laterality Date  . cardiac catherization  12/09/2008   normal coranaries,normal EF.  . dilatation and curettage     due to SAB x 2  . thyroid nodule  1990   benign  . TOTAL ABDOMINAL HYSTERECTOMY W/ BILATERAL SALPINGOOPHORECTOMY  2003   complex endometrial hyperplasia, DUB, fibroids  . WISDOM TOOTH EXTRACTION      Current Outpatient Medications:  .  albuterol (PROAIR HFA) 108 (90 BASE) MCG/ACT inhaler, Inhale 2 puffs into the lungs every 6 (six) hours as needed., Disp: , Rfl:  .  aspirin 81 MG tablet, Take 81 mg by mouth daily., Disp: , Rfl:  .  CALCIUM-MAGNESUIUM-ZINC 333-133-8.3 MG TABS, Take 3 tablets by mouth daily., Disp: , Rfl:  .  Cholecalciferol (VITAMIN D3) 1000 units CAPS, Take by mouth., Disp: , Rfl:  .  estradiol (VIVELLE-DOT) 0.075 MG/24HR, Place 1 patch onto the skin 2 (two) times a week., Disp: 8 patch, Rfl: 11 .  fluticasone (FLONASE) 50 MCG/ACT nasal spray, Place 2 sprays into the nose daily., Disp: 16 g, Rfl: 2 .  levothyroxine (SYNTHROID, LEVOTHROID) 75 MCG tablet, Take 75 mcg by mouth daily., Disp: , Rfl:  .  metoprolol succinate (TOPROL-XL) 25 MG 24 hr tablet, TAKE 1 TABLET BY MOUTH TWICE A DAY, Disp: , Rfl:  .  montelukast (SINGULAIR) 10 MG tablet, Take 10 mg by mouth daily., Disp: , Rfl:  .  Multiple Vitamin (MULTIVITAMIN) tablet, Take 1 tablet by mouth daily., Disp: , Rfl:  .  ranitidine (ZANTAC) 150 MG tablet, Take 1 tablet (150 mg total) by mouth 2 (two)  times daily., Disp: 60 tablet, Rfl: 1 .  amitriptyline (ELAVIL) 10 MG tablet, Take 1 tablet (10 mg total) by mouth at bedtime. (Patient not taking: Reported on 03/24/2018), Disp: 30 tablet, Rfl: 0 .  nitrofurantoin, macrocrystal-monohydrate, (MACROBID) 100 MG capsule, Take 1 capsule (100 mg total) by mouth 2 (two) times daily. (Patient not taking: Reported on 03/24/2018), Disp: 20 capsule, Rfl: 0    Family History  Problem Relation Age of Onset  . Heart disease Mother        CAD  . Stroke Mother        x 2  . Mental illness Mother        not diagnosed  . Irritable bowel syndrome Mother   . Heart disease Father        CAD  . Parkinsonism Father   . Cancer Sister        Breast  . Cancer Unknown        Breast and Ovarian  . Heart disease Brother   . Schizophrenia Sister   . Lung disease Sister      Social History  Tobacco Use  . Smoking status: Never Smoker  . Smokeless tobacco: Never Used  Substance Use Topics  . Alcohol use: No  . Drug use: No    Allergies as of 03/24/2018 - Review Complete 03/24/2018  Allergen Reaction Noted  . Claritin [loratadine]  07/04/2012  . Fructose Diarrhea 03/12/2016  . Gluten meal Other (See Comments) 09/23/2012  . Lactase Other (See Comments) 02/23/2014  . Levaquin [levofloxacin] Nausea Only 07/04/2012  . Sulfa drugs cross reactors Hives 07/04/2012    Review of Systems:    All systems reviewed and negative except where noted in HPI.   Physical Exam:  BP 112/67 (BP Location: Left Arm, Patient Position: Sitting, Cuff Size: Normal)   Pulse (!) 55   Resp 16   Ht 5' 8" (1.727 m)   Wt 137 lb 12.8 oz (62.5 kg)   BMI 20.95 kg/m  No LMP recorded. Patient has had a hysterectomy.  General:   Alert,  Well-developed, well-nourished, pleasant and cooperative in NAD Head:  Normocephalic and atraumatic. Eyes:  Sclera clear, no icterus.   Conjunctiva pink. Ears:  Normal auditory acuity. Nose:  No deformity, discharge, or lesions. Mouth:  No  deformity or lesions,oropharynx pink & moist. Neck:  Supple; no masses or thyromegaly. Lungs:  Respirations even and unlabored.  Clear throughout to auscultation.   No wheezes, crackles, or rhonchi. No acute distress. Heart:  Regular rate and rhythm; no murmurs, clicks, rubs, or gallops. Abdomen:  Normal bowel sounds. Soft, non-tender and non-distended without masses, hepatosplenomegaly or hernias noted.  No guarding or rebound tenderness.   Rectal: Not performed Msk:  Symmetrical, mild redness and deformities in interphalangeal joints of bilateral hands. Good, equal movement & strength bilaterally. Pulses:  Normal pulses noted. Extremities:  No clubbing or edema.  No cyanosis. Neurologic:  Alert and oriented x3;  grossly normal neurologically. Skin:  Intact without significant lesions or rashes. No jaundice. Lymph Nodes:  No significant cervical adenopathy. Psych:  Alert and cooperative. Normal mood and affect.  Imaging Studies: Abdominal imaging reviewed  Assessment and Plan:   ALANDRIA BUTKIEWICZ is a 72 y.o. Caucasian female with hypothyroidism, on levothyroxine, long-standing history of abdominal bloating,alternating episodes of constipation and diarrhea is seen for follow-up of for worsening of her GI symptoms and progressive weight loss. She is on gluten free diet, lactose-free diet and low FODMAPs. Workup so far revealed normal CRP, negative ANA, unremarkable EGD, colonoscopy on random colon biopsies revealed mild active cryptitis, elevated fecal calprotectin, normal ultrasound and HIDA scan, negative TTG IgA. Stool studies positive for Escherichia coli, status post treatment with Cipro for 2 weeks  Chronic diarrhea alternating with constipation with recent flareup - repeat stool studies if she has recurrence of diarrhea - recheck Fecal calprotectin level in 2 weeks, if elevated and no evidence of infectious diarrhea, will start her on mesalamine or budesonide - trial of IB  guard  Epigastric pain/GERD: Increase Prilosec to 40 mg twice daily Stop ranitidine  Small joint arthritis and back pain - follow-up with rheumatology for further evaluation of her arthritis symptoms   Cc notes to Fenton Malling, PA-C   Follow up in 3 months   [name], MD

## 2018-03-26 ENCOUNTER — Encounter: Payer: Self-pay | Admitting: Gastroenterology

## 2018-03-27 ENCOUNTER — Other Ambulatory Visit: Payer: Self-pay | Admitting: Physician Assistant

## 2018-03-27 ENCOUNTER — Other Ambulatory Visit: Payer: Self-pay | Admitting: Gastroenterology

## 2018-03-27 DIAGNOSIS — G8929 Other chronic pain: Secondary | ICD-10-CM

## 2018-03-27 DIAGNOSIS — K219 Gastro-esophageal reflux disease without esophagitis: Secondary | ICD-10-CM

## 2018-03-27 DIAGNOSIS — R1013 Epigastric pain: Secondary | ICD-10-CM

## 2018-03-27 MED ORDER — OMEPRAZOLE 40 MG PO CPDR: 40.0000 mg | DELAYED_RELEASE_CAPSULE | Freq: Two times a day (BID) | ORAL | 0 refills | Status: DC

## 2018-03-31 DIAGNOSIS — G8929 Other chronic pain: Secondary | ICD-10-CM | POA: Insufficient documentation

## 2018-03-31 DIAGNOSIS — M7581 Other shoulder lesions, right shoulder: Secondary | ICD-10-CM | POA: Diagnosis not present

## 2018-03-31 DIAGNOSIS — M180 Bilateral primary osteoarthritis of first carpometacarpal joints: Secondary | ICD-10-CM | POA: Diagnosis not present

## 2018-03-31 DIAGNOSIS — M545 Low back pain: Secondary | ICD-10-CM | POA: Diagnosis not present

## 2018-03-31 DIAGNOSIS — M75121 Complete rotator cuff tear or rupture of right shoulder, not specified as traumatic: Secondary | ICD-10-CM | POA: Diagnosis not present

## 2018-03-31 DIAGNOSIS — M19041 Primary osteoarthritis, right hand: Secondary | ICD-10-CM | POA: Diagnosis not present

## 2018-03-31 DIAGNOSIS — M19042 Primary osteoarthritis, left hand: Secondary | ICD-10-CM | POA: Diagnosis not present

## 2018-04-07 ENCOUNTER — Other Ambulatory Visit
Admission: RE | Admit: 2018-04-07 | Discharge: 2018-04-07 | Disposition: A | Discharge status: Home / Self Care | Payer: Medicare Other | Source: Ambulatory Visit | Attending: Gastroenterology | Admitting: Gastroenterology

## 2018-04-07 DIAGNOSIS — K219 Gastro-esophageal reflux disease without esophagitis: Secondary | ICD-10-CM | POA: Diagnosis not present

## 2018-04-07 DIAGNOSIS — A498 Other bacterial infections of unspecified site: Secondary | ICD-10-CM | POA: Diagnosis not present

## 2018-04-10 LAB — CALPROTECTIN, FECAL: CALPROTECTIN, FECAL: 46 ug/g (ref 0–120)

## 2018-04-30 ENCOUNTER — Emergency Department (HOSPITAL_COMMUNITY): Payer: Medicare Other

## 2018-04-30 ENCOUNTER — Encounter (HOSPITAL_COMMUNITY): Payer: Self-pay

## 2018-04-30 ENCOUNTER — Observation Stay (HOSPITAL_COMMUNITY)
Admission: EM | Admit: 2018-04-30 | Discharge: 2018-05-01 | Disposition: A | Discharge status: Home / Self Care | Payer: Medicare Other | Attending: Internal Medicine | Admitting: Internal Medicine

## 2018-04-30 ENCOUNTER — Observation Stay (HOSPITAL_COMMUNITY): Payer: Medicare Other

## 2018-04-30 ENCOUNTER — Other Ambulatory Visit: Payer: Self-pay

## 2018-04-30 DIAGNOSIS — Z79899 Other long term (current) drug therapy: Secondary | ICD-10-CM | POA: Insufficient documentation

## 2018-04-30 DIAGNOSIS — R002 Palpitations: Secondary | ICD-10-CM | POA: Diagnosis not present

## 2018-04-30 DIAGNOSIS — A044 Other intestinal Escherichia coli infections: Secondary | ICD-10-CM | POA: Diagnosis present

## 2018-04-30 DIAGNOSIS — S199XXA Unspecified injury of neck, initial encounter: Secondary | ICD-10-CM | POA: Diagnosis not present

## 2018-04-30 DIAGNOSIS — A042 Enteroinvasive Escherichia coli infection: Secondary | ICD-10-CM | POA: Diagnosis not present

## 2018-04-30 DIAGNOSIS — R079 Chest pain, unspecified: Secondary | ICD-10-CM | POA: Diagnosis not present

## 2018-04-30 DIAGNOSIS — E079 Disorder of thyroid, unspecified: Secondary | ICD-10-CM | POA: Insufficient documentation

## 2018-04-30 DIAGNOSIS — J45909 Unspecified asthma, uncomplicated: Secondary | ICD-10-CM | POA: Insufficient documentation

## 2018-04-30 DIAGNOSIS — R4020 Unspecified coma: Secondary | ICD-10-CM | POA: Diagnosis not present

## 2018-04-30 DIAGNOSIS — Z7982 Long term (current) use of aspirin: Secondary | ICD-10-CM | POA: Diagnosis not present

## 2018-04-30 DIAGNOSIS — R55 Syncope and collapse: Principal | ICD-10-CM | POA: Insufficient documentation

## 2018-04-30 DIAGNOSIS — S0990XA Unspecified injury of head, initial encounter: Secondary | ICD-10-CM | POA: Diagnosis not present

## 2018-04-30 DIAGNOSIS — K219 Gastro-esophageal reflux disease without esophagitis: Secondary | ICD-10-CM | POA: Insufficient documentation

## 2018-04-30 DIAGNOSIS — R410 Disorientation, unspecified: Secondary | ICD-10-CM | POA: Diagnosis not present

## 2018-04-30 DIAGNOSIS — R0902 Hypoxemia: Secondary | ICD-10-CM | POA: Diagnosis not present

## 2018-04-30 HISTORY — DX: Other intestinal Escherichia coli infections: A04.4

## 2018-04-30 LAB — CBC WITH DIFFERENTIAL/PLATELET
Abs Immature Granulocytes: 0 10*3/uL (ref 0.0–0.1)
BASOS ABS: 0 10*3/uL (ref 0.0–0.1)
BASOS PCT: 1 %
Eosinophils Absolute: 0.1 10*3/uL (ref 0.0–0.7)
Eosinophils Relative: 2 %
HEMATOCRIT: 45.7 % (ref 36.0–46.0)
HEMOGLOBIN: 15.5 g/dL — AB (ref 12.0–15.0)
Immature Granulocytes: 0 %
LYMPHS PCT: 29 %
Lymphs Abs: 1.1 10*3/uL (ref 0.7–4.0)
MCH: 32.1 pg (ref 26.0–34.0)
MCHC: 33.9 g/dL (ref 30.0–36.0)
MCV: 94.6 fL (ref 78.0–100.0)
MONO ABS: 0.4 10*3/uL (ref 0.1–1.0)
Monocytes Relative: 11 %
NEUTROS ABS: 2.1 10*3/uL (ref 1.7–7.7)
NEUTROS PCT: 57 %
Platelets: 223 10*3/uL (ref 150–400)
RBC: 4.83 MIL/uL (ref 3.87–5.11)
RDW: 12 % (ref 11.5–15.5)
WBC: 3.7 10*3/uL — ABNORMAL LOW (ref 4.0–10.5)

## 2018-04-30 LAB — COMPREHENSIVE METABOLIC PANEL
ALBUMIN: 4.3 g/dL (ref 3.5–5.0)
ALT: 21 U/L (ref 0–44)
ANION GAP: 10 (ref 5–15)
AST: 27 U/L (ref 15–41)
Alkaline Phosphatase: 71 U/L (ref 38–126)
BUN: 27 mg/dL — AB (ref 8–23)
CALCIUM: 9.3 mg/dL (ref 8.9–10.3)
CO2: 25 mmol/L (ref 22–32)
Chloride: 105 mmol/L (ref 98–111)
Creatinine, Ser: 0.99 mg/dL (ref 0.44–1.00)
GFR calc Af Amer: >60 mL/min (ref >60)
GFR calc non Af Amer: 56 mL/min — ABNORMAL LOW (ref >60)
Glucose, Bld: 91 mg/dL (ref 70–99)
Potassium: 3.9 mmol/L (ref 3.5–5.1)
SODIUM: 140 mmol/L (ref 135–145)
Total Bilirubin: 0.6 mg/dL (ref 0.3–1.2)
Total Protein: 6.6 g/dL (ref 6.5–8.1)

## 2018-04-30 LAB — URINALYSIS, ROUTINE W REFLEX MICROSCOPIC
BILIRUBIN URINE: NEGATIVE
Glucose, UA: >=500 mg/dL — AB
KETONES UR: NEGATIVE mg/dL
Nitrite: NEGATIVE
Protein, ur: NEGATIVE mg/dL
Specific Gravity, Urine: 1.023 (ref 1.005–1.030)
pH: 5 (ref 5.0–8.0)

## 2018-04-30 LAB — TSH: TSH: 0.884 u[IU]/mL (ref 0.350–4.500)

## 2018-04-30 LAB — TROPONIN I

## 2018-04-30 LAB — I-STAT TROPONIN, ED: Troponin i, poc: 0 ng/mL (ref 0.00–0.08)

## 2018-04-30 MED ORDER — VITAMIN D3 25 MCG (1000 UNIT) PO TABS
2000.0000 [IU] | ORAL_TABLET | Freq: Every day | ORAL | Status: DC
  Administered 2018-05-01: 2000 [IU] via ORAL
  Filled 2018-04-30 (×2): qty 2

## 2018-04-30 MED ORDER — TRAZODONE HCL 50 MG PO TABS: 25.0000 mg | ORAL_TABLET | Freq: Every evening | ORAL | Status: DC | PRN

## 2018-04-30 MED ORDER — ACETAMINOPHEN ER 650 MG PO TBCR: 650.0000 mg | EXTENDED_RELEASE_TABLET | ORAL | Status: DC | PRN

## 2018-04-30 MED ORDER — LEVOTHYROXINE SODIUM 75 MCG PO TABS
75.0000 ug | ORAL_TABLET | Freq: Every day | ORAL | Status: DC
  Administered 2018-05-01: 75 ug via ORAL
  Filled 2018-04-30: qty 1

## 2018-04-30 MED ORDER — ONDANSETRON HCL 4 MG PO TABS: 4.0000 mg | ORAL_TABLET | Freq: Four times a day (QID) | ORAL | Status: DC | PRN

## 2018-04-30 MED ORDER — FAMOTIDINE 20 MG PO TABS
20.0000 mg | ORAL_TABLET | Freq: Every day | ORAL | Status: DC
  Administered 2018-04-30: 20 mg via ORAL
  Filled 2018-04-30: qty 1

## 2018-04-30 MED ORDER — ASPIRIN 81 MG PO CHEW: 81.0000 mg | CHEWABLE_TABLET | Freq: Every day | ORAL | Status: DC

## 2018-04-30 MED ORDER — SENNOSIDES-DOCUSATE SODIUM 8.6-50 MG PO TABS: 1.0000 | ORAL_TABLET | Freq: Every evening | ORAL | Status: DC | PRN

## 2018-04-30 MED ORDER — SODIUM CHLORIDE 0.9 % IV SOLN
INTRAVENOUS | Status: AC
  Administered 2018-04-30: 17:00:00 via INTRAVENOUS

## 2018-04-30 MED ORDER — METOPROLOL SUCCINATE ER 25 MG PO TB24
12.5000 mg | ORAL_TABLET | Freq: Every day | ORAL | Status: DC
  Administered 2018-05-01: 12.5 mg via ORAL
  Filled 2018-04-30: qty 1

## 2018-04-30 MED ORDER — ASPIRIN 81 MG PO CHEW
81.0000 mg | CHEWABLE_TABLET | Freq: Every day | ORAL | Status: DC
  Administered 2018-04-30: 81 mg via ORAL
  Filled 2018-04-30: qty 1

## 2018-04-30 MED ORDER — ACETAMINOPHEN 325 MG PO TABS
650.0000 mg | ORAL_TABLET | Freq: Four times a day (QID) | ORAL | Status: DC | PRN
  Administered 2018-04-30 – 2018-05-01 (×3): 650 mg via ORAL
  Filled 2018-04-30 (×3): qty 2

## 2018-04-30 MED ORDER — ONDANSETRON HCL 4 MG/2ML IJ SOLN: 4.0000 mg | Freq: Four times a day (QID) | INTRAMUSCULAR | Status: DC | PRN

## 2018-04-30 MED ORDER — SODIUM CHLORIDE 0.9% FLUSH
3.0000 mL | Freq: Two times a day (BID) | INTRAVENOUS | Status: DC
  Administered 2018-04-30 (×2): 3 mL via INTRAVENOUS

## 2018-04-30 MED ORDER — ALBUTEROL SULFATE (2.5 MG/3ML) 0.083% IN NEBU: 3.0000 mL | INHALATION_SOLUTION | Freq: Four times a day (QID) | RESPIRATORY_TRACT | Status: DC | PRN

## 2018-04-30 MED ORDER — LEVOTHYROXINE SODIUM 75 MCG PO TABS: 75.0000 ug | ORAL_TABLET | Freq: Every day | ORAL | Status: DC

## 2018-04-30 MED ORDER — PANTOPRAZOLE SODIUM 40 MG PO TBEC: 40.0000 mg | DELAYED_RELEASE_TABLET | Freq: Every day | ORAL | Status: DC

## 2018-04-30 MED ORDER — ENOXAPARIN SODIUM 40 MG/0.4ML ~~LOC~~ SOLN
40.0000 mg | SUBCUTANEOUS | Status: DC
  Administered 2018-04-30: 40 mg via SUBCUTANEOUS
  Filled 2018-04-30: qty 0.4

## 2018-04-30 MED ORDER — ACETAMINOPHEN 650 MG RE SUPP: 650.0000 mg | Freq: Four times a day (QID) | RECTAL | Status: DC | PRN

## 2018-04-30 MED ORDER — HYDROCODONE-ACETAMINOPHEN 5-325 MG PO TABS: 1.0000 | ORAL_TABLET | ORAL | Status: DC | PRN

## 2018-04-30 MED ORDER — ALUM & MAG HYDROXIDE-SIMETH 200-200-20 MG/5ML PO SUSP: 30.0000 mL | Freq: Four times a day (QID) | ORAL | Status: DC | PRN

## 2018-04-30 MED ORDER — PANTOPRAZOLE SODIUM 40 MG PO TBEC
40.0000 mg | DELAYED_RELEASE_TABLET | Freq: Every day | ORAL | Status: DC
  Administered 2018-04-30: 40 mg via ORAL
  Filled 2018-04-30: qty 1

## 2018-04-30 NOTE — Plan of Care (Signed)
  Problem: Education: Goal: Knowledge of General Education information will improve Outcome: Completed/Met

## 2018-04-30 NOTE — Progress Notes (Signed)
EEG complete - results pending 
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
## 2018-04-30 NOTE — Procedures (Signed)
History: 72 year old female being evaluated for syncope  Sedation: None  Technique: This is a 21 channel routine scalp EEG performed at the bedside with bipolar and monopolar montages arranged in accordance to the international 10/20 system of electrode placement. One channel was dedicated to EKG recording.    Background: The background consists of intermixed alpha and beta activities. There is a well defined posterior dominant rhythm of 9 Hz that attenuates with eye opening. Sleep is recorded with normal appearing structures.   Photic stimulation: Physiologic driving is not performed  EEG Abnormalities: None  Clinical Interpretation: This normal EEG is recorded in the waking and sleep state. There was no seizure or seizure predisposition recorded on this study. Please note that a normal EEG does not preclude the possibility of epilepsy.   Roland Rack, MD [facility] [phone]  If 7pm- 7am, please page neurology on call as listed in [place].
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
## 2018-05-01 ENCOUNTER — Observation Stay (HOSPITAL_BASED_OUTPATIENT_CLINIC_OR_DEPARTMENT_OTHER): Payer: Medicare Other

## 2018-05-01 DIAGNOSIS — R002 Palpitations: Secondary | ICD-10-CM

## 2018-05-01 DIAGNOSIS — R55 Syncope and collapse: Secondary | ICD-10-CM

## 2018-05-01 LAB — ECHOCARDIOGRAM COMPLETE
Height: 66 in
WEIGHTICAEL: 2137.6 [oz_av]

## 2018-05-01 LAB — BASIC METABOLIC PANEL
ANION GAP: 10 (ref 5–15)
BUN: 24 mg/dL — ABNORMAL HIGH (ref 8–23)
CALCIUM: 8.8 mg/dL — AB (ref 8.9–10.3)
CO2: 26 mmol/L (ref 22–32)
Chloride: 103 mmol/L (ref 98–111)
Creatinine, Ser: 0.86 mg/dL (ref 0.44–1.00)
GFR calc non Af Amer: >60 mL/min (ref >60)
GLUCOSE: 108 mg/dL — AB (ref 70–99)
Potassium: 3.7 mmol/L (ref 3.5–5.1)
Sodium: 139 mmol/L (ref 135–145)

## 2018-05-01 LAB — HIV ANTIBODY (ROUTINE TESTING W REFLEX): HIV Screen 4th Generation wRfx: NONREACTIVE

## 2018-05-01 LAB — CBC
HCT: 41.1 % (ref 36.0–46.0)
HEMOGLOBIN: 14 g/dL (ref 12.0–15.0)
MCH: 31.8 pg (ref 26.0–34.0)
MCHC: 34.1 g/dL (ref 30.0–36.0)
MCV: 93.4 fL (ref 78.0–100.0)
PLATELETS: 229 10*3/uL (ref 150–400)
RBC: 4.4 MIL/uL (ref 3.87–5.11)
RDW: 11.9 % (ref 11.5–15.5)
WBC: 3.2 10*3/uL — ABNORMAL LOW (ref 4.0–10.5)

## 2018-05-01 LAB — GLUCOSE, CAPILLARY: Glucose-Capillary: 91 mg/dL (ref 70–99)

## 2018-05-01 LAB — TROPONIN I: Troponin I: <0.03 ng/mL (ref <0.03)

## 2018-05-01 NOTE — Progress Notes (Signed)
  Echocardiogram 2D Echocardiogram has been performed.  Jannett Celestine 05/01/2018, 10:01 AM
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
## 2018-05-01 NOTE — Progress Notes (Signed)
Ms. still has had no further events.  Today she is awake, alert, interactive and appropriate.  MRI and EEG are normal.  With lack of postictal state and lack of other features suggestive of seizure, I would favor hypoperfusion as an etiology of her sudden loss of consciousness.  She is going to be evaluated by cardiology and I agree with this.  Patient is unable to drive, operate heavy machinery, perform activities at heights or participate in water activities until release by outpatient physician. This was discussed with the patient who expressed understanding.   At this time I would not start antiepileptic medications, no further recommendations unless she were to have further spells.  Please call with any further questions or concerns  Roland Rack, MD [facility] [phone]  If 7pm- 7am, please page neurology on call as listed in [place].

## 2018-05-05 DIAGNOSIS — R0609 Other forms of dyspnea: Secondary | ICD-10-CM | POA: Diagnosis not present

## 2018-05-05 DIAGNOSIS — R42 Dizziness and giddiness: Secondary | ICD-10-CM | POA: Diagnosis not present

## 2018-05-05 DIAGNOSIS — R55 Syncope and collapse: Secondary | ICD-10-CM | POA: Diagnosis not present

## 2018-05-05 DIAGNOSIS — R002 Palpitations: Secondary | ICD-10-CM | POA: Diagnosis not present

## 2018-05-05 DIAGNOSIS — K219 Gastro-esophageal reflux disease without esophagitis: Secondary | ICD-10-CM | POA: Diagnosis not present

## 2018-05-05 DIAGNOSIS — J45909 Unspecified asthma, uncomplicated: Secondary | ICD-10-CM | POA: Diagnosis not present

## 2018-05-05 DIAGNOSIS — J329 Chronic sinusitis, unspecified: Secondary | ICD-10-CM | POA: Diagnosis not present

## 2018-05-06 DIAGNOSIS — R55 Syncope and collapse: Secondary | ICD-10-CM | POA: Diagnosis not present

## 2018-05-14 DIAGNOSIS — R55 Syncope and collapse: Secondary | ICD-10-CM | POA: Diagnosis not present

## 2018-06-12 DIAGNOSIS — R55 Syncope and collapse: Secondary | ICD-10-CM | POA: Diagnosis not present

## 2018-06-26 ENCOUNTER — Encounter: Payer: Self-pay | Admitting: Gastroenterology

## 2018-06-26 ENCOUNTER — Ambulatory Visit (INDEPENDENT_AMBULATORY_CARE_PROVIDER_SITE_OTHER): Payer: Medicare Other | Admitting: Gastroenterology

## 2018-06-26 VITALS — BP 102/63 | HR 73 | Ht 68.0 in | Wt 139.6 lb

## 2018-06-26 DIAGNOSIS — K219 Gastro-esophageal reflux disease without esophagitis: Secondary | ICD-10-CM | POA: Diagnosis not present
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
## 2018-07-01 ENCOUNTER — Ambulatory Visit
Admission: RE | Admit: 2018-07-01 | Discharge: 2018-07-01 | Disposition: A | Discharge status: Home / Self Care | Payer: Medicare Other | Source: Ambulatory Visit | Attending: Physician Assistant | Admitting: Physician Assistant

## 2018-07-01 ENCOUNTER — Encounter: Payer: Self-pay | Admitting: Physician Assistant

## 2018-07-01 ENCOUNTER — Ambulatory Visit (INDEPENDENT_AMBULATORY_CARE_PROVIDER_SITE_OTHER): Payer: Medicare Other | Admitting: Physician Assistant

## 2018-07-01 VITALS — BP 110/70 | HR 72 | Temp 97.8°F | Resp 16 | Wt 141.0 lb

## 2018-07-01 DIAGNOSIS — G8929 Other chronic pain: Secondary | ICD-10-CM | POA: Diagnosis not present

## 2018-07-01 DIAGNOSIS — M546 Pain in thoracic spine: Secondary | ICD-10-CM | POA: Diagnosis not present

## 2018-07-01 DIAGNOSIS — M4804 Spinal stenosis, thoracic region: Secondary | ICD-10-CM | POA: Diagnosis not present

## 2018-07-01 DIAGNOSIS — Z23 Encounter for immunization: Secondary | ICD-10-CM | POA: Diagnosis not present

## 2018-07-01 DIAGNOSIS — M48061 Spinal stenosis, lumbar region without neurogenic claudication: Secondary | ICD-10-CM | POA: Diagnosis not present

## 2018-07-01 DIAGNOSIS — D72819 Decreased white blood cell count, unspecified: Secondary | ICD-10-CM | POA: Diagnosis not present

## 2018-07-01 DIAGNOSIS — K909 Intestinal malabsorption, unspecified: Secondary | ICD-10-CM

## 2018-07-01 DIAGNOSIS — R5383 Other fatigue: Secondary | ICD-10-CM | POA: Diagnosis not present

## 2018-07-01 DIAGNOSIS — M545 Low back pain: Secondary | ICD-10-CM

## 2018-07-01 DIAGNOSIS — Z780 Asymptomatic menopausal state: Secondary | ICD-10-CM | POA: Diagnosis not present

## 2018-07-01 DIAGNOSIS — E079 Disorder of thyroid, unspecified: Secondary | ICD-10-CM | POA: Diagnosis not present

## 2018-07-01 DIAGNOSIS — M5136 Other intervertebral disc degeneration, lumbar region: Secondary | ICD-10-CM | POA: Diagnosis not present

## 2018-07-01 DIAGNOSIS — A044 Other intestinal Escherichia coli infections: Secondary | ICD-10-CM
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
## 2018-07-02 ENCOUNTER — Telehealth: Payer: Self-pay

## 2018-07-02 NOTE — Telephone Encounter (Signed)
Viewed by Waynetta Sandy on 07/02/2018 10:45 AM

## 2018-07-02 NOTE — Telephone Encounter (Signed)
-----   Message from Mar Daring, PA-C sent at 07/02/2018  8:39 AM EDT ----- Again degenerative disc noted and spurring which is a natural progression of back arthritic changes.

## 2018-07-02 NOTE — Telephone Encounter (Signed)
-----   Message from Mar Daring, PA-C sent at 07/02/2018  8:38 AM EDT ----- There is degenerative disc noted throughout the lumbar spine. This is normally associated with arthritic changes. SI joints are normal.

## 2018-07-03 ENCOUNTER — Encounter: Payer: Self-pay | Admitting: Physician Assistant

## 2018-07-03 ENCOUNTER — Telehealth: Payer: Self-pay

## 2018-07-03 NOTE — Telephone Encounter (Signed)
-----   Message from Mar Daring, [place] sent at 07/03/2018  9:50 AM EDT ----- All labs thus far are normal. Still awaiting result for thiamine (B1).

## 2018-07-03 NOTE — Telephone Encounter (Signed)
Patient advised as directed below.  Thanks,  -Joseline 

## 2018-07-05 LAB — BASIC METABOLIC PANEL
BUN / CREAT RATIO: 28 (ref 12–28)
BUN: 24 mg/dL (ref 8–27)
CHLORIDE: 101 mmol/L (ref 96–106)
CO2: 26 mmol/L (ref 20–29)
Calcium: 9.7 mg/dL (ref 8.7–10.3)
Creatinine, Ser: 0.87 mg/dL (ref 0.57–1.00)
GFR calc Af Amer: 78 mL/min/{1.73_m2} (ref >59)
GFR calc non Af Amer: 68 mL/min/{1.73_m2} (ref >59)
Glucose: 87 mg/dL (ref 65–99)
Potassium: 3.9 mmol/L (ref 3.5–5.2)
SODIUM: 141 mmol/L (ref 134–144)

## 2018-07-05 LAB — MAGNESIUM: Magnesium: 2.2 mg/dL (ref 1.6–2.3)

## 2018-07-05 LAB — CBC WITH DIFFERENTIAL/PLATELET
Basophils Absolute: 0 10*3/uL (ref 0.0–0.2)
Basos: 1 %
EOS (ABSOLUTE): 0.1 10*3/uL (ref 0.0–0.4)
Eos: 2 %
Hematocrit: 41.5 % (ref 34.0–46.6)
Hemoglobin: 14.2 g/dL (ref 11.1–15.9)
IMMATURE GRANS (ABS): 0 10*3/uL (ref 0.0–0.1)
Immature Granulocytes: 0 %
LYMPHS: 23 %
Lymphocytes Absolute: 1.3 10*3/uL (ref 0.7–3.1)
MCH: 32.1 pg (ref 26.6–33.0)
MCHC: 34.2 g/dL (ref 31.5–35.7)
MCV: 94 fL (ref 79–97)
Monocytes Absolute: 0.6 10*3/uL (ref 0.1–0.9)
Monocytes: 10 %
NEUTROS ABS: 3.7 10*3/uL (ref 1.4–7.0)
NEUTROS PCT: 64 %
PLATELETS: 283 10*3/uL (ref 150–450)
RBC: 4.43 x10E6/uL (ref 3.77–5.28)
RDW: 12 % — ABNORMAL LOW (ref 12.3–15.4)
WBC: 5.7 10*3/uL (ref 3.4–10.8)

## 2018-07-05 LAB — VITAMIN B1: THIAMINE: 159.6 nmol/L (ref 66.5–200.0)

## 2018-07-05 LAB — B12 AND FOLATE PANEL
Folate: 15.4 ng/mL (ref >3.0)
Vitamin B-12: 635 pg/mL (ref 232–1245)

## 2018-07-05 LAB — TSH: TSH: 0.863 u[IU]/mL (ref 0.450–4.500)

## 2018-07-05 LAB — VITAMIN D 25 HYDROXY (VIT D DEFICIENCY, FRACTURES): Vit D, 25-Hydroxy: 47 ng/mL (ref 30.0–100.0)

## 2018-07-06 ENCOUNTER — Encounter: Payer: Self-pay | Admitting: Physician Assistant

## 2018-07-16 DIAGNOSIS — Z1231 Encounter for screening mammogram for malignant neoplasm of breast: Secondary | ICD-10-CM | POA: Diagnosis not present

## 2018-07-24 ENCOUNTER — Other Ambulatory Visit: Payer: Self-pay | Admitting: Physician Assistant

## 2018-08-19 DIAGNOSIS — K589 Irritable bowel syndrome without diarrhea: Secondary | ICD-10-CM | POA: Diagnosis not present

## 2018-08-19 DIAGNOSIS — J329 Chronic sinusitis, unspecified: Secondary | ICD-10-CM | POA: Diagnosis not present

## 2018-08-19 DIAGNOSIS — K219 Gastro-esophageal reflux disease without esophagitis: Secondary | ICD-10-CM | POA: Diagnosis not present

## 2018-08-19 DIAGNOSIS — J45909 Unspecified asthma, uncomplicated: Secondary | ICD-10-CM | POA: Diagnosis not present

## 2018-08-19 DIAGNOSIS — E079 Disorder of thyroid, unspecified: Secondary | ICD-10-CM | POA: Diagnosis not present

## 2018-08-19 DIAGNOSIS — R0609 Other forms of dyspnea: Secondary | ICD-10-CM | POA: Diagnosis not present

## 2018-08-19 DIAGNOSIS — R55 Syncope and collapse: Secondary | ICD-10-CM | POA: Diagnosis not present

## 2018-08-19 DIAGNOSIS — R002 Palpitations: Secondary | ICD-10-CM | POA: Diagnosis not present

## 2018-08-19 DIAGNOSIS — R42 Dizziness and giddiness: Secondary | ICD-10-CM | POA: Diagnosis not present

## 2018-08-19 DIAGNOSIS — R000 Tachycardia, unspecified: Secondary | ICD-10-CM | POA: Diagnosis not present

## 2018-09-09 ENCOUNTER — Telehealth: Payer: Self-pay

## 2018-09-09 NOTE — Telephone Encounter (Signed)
FYI...  Pt called complaining of increasing shortness of breath, "Heart pounding".  She states these are her symptoms of an asthma exacerbation.  She used her proair but it is not helping like it usually does.  I advised her to either go to the ER or Urgent Care to be evaluated.  She refused to go because she does not want to wait "Six hours there".  She requested an appointment for tomorrow.  I advised her Tawanna Sat was out of the office but I will schedule her with another provider.  I stated again that I don't recommend she wait until tomorrow to be seen.  She stated she "I understand what you are saying but I'm not going and if I stop breathing I will call rescue."  Made apt for 8:20 with Adriana on 09/10/2018.  Thanks,   -Mickel Baas

## 2018-09-09 NOTE — Telephone Encounter (Signed)
Noted, thank you

## 2018-09-10 ENCOUNTER — Encounter: Payer: Self-pay | Admitting: Physician Assistant

## 2018-09-10 ENCOUNTER — Ambulatory Visit (INDEPENDENT_AMBULATORY_CARE_PROVIDER_SITE_OTHER): Payer: Medicare Other | Admitting: Physician Assistant

## 2018-09-10 VITALS — BP 106/60 | HR 71 | Temp 97.5°F | Resp 16 | Ht 67.0 in | Wt 135.0 lb

## 2018-09-10 DIAGNOSIS — J45901 Unspecified asthma with (acute) exacerbation: Secondary | ICD-10-CM

## 2018-09-10 DIAGNOSIS — J45909 Unspecified asthma, uncomplicated: Secondary | ICD-10-CM

## 2018-09-10 MED ORDER — FLUTICASONE FUROATE-VILANTEROL 100-25 MCG/INH IN AEPB: 1.0000 | INHALATION_SPRAY | Freq: Every day | RESPIRATORY_TRACT | 0 refills | Status: DC

## 2018-09-10 MED ORDER — IPRATROPIUM-ALBUTEROL 0.5-2.5 (3) MG/3ML IN SOLN: 3.0000 mL | Freq: Once | RESPIRATORY_TRACT | Status: DC

## 2018-09-10 MED ORDER — ALBUTEROL SULFATE HFA 108 (90 BASE) MCG/ACT IN AERS: 2.0000 | INHALATION_SPRAY | Freq: Four times a day (QID) | RESPIRATORY_TRACT | 1 refills | Status: DC | PRN

## 2018-09-10 NOTE — Progress Notes (Signed)
Acute Office Visit  Subjective:    Patient ID: Kimberly Mercer, female    DOB: Sep 22, 1946, 72 y.o.   MRN: [id number]  Chief Complaint  Patient presents with  . Asthma    HPI Patient has a history of asthma and presents today for asthma attack yesterday. Patient reports she has a history of allergies and has been using Flonase daily. Patient reports SOB, wheezing and chest tightness yesterday that she used her Proair for and after two hours it finally helped her. Patient reports she did use cleaning products and that could have triggered her attack. Patient wanting to know if she can get a prescription for Albuterol and reports that in the past it has helped almost instantly. She denies smoking. Denies current chest pain, fever, productive cough.   BP Readings from Last 3 Encounters:  09/10/18 106/60  07/01/18 110/70  06/26/18 102/63     Past Medical History:  Diagnosis Date  . Allergic rhinitis, cause unspecified   . Allergy   . Asthma   . Benign neoplasm of colon   . Cancer ([place])   . Candidiasis of the esophagus   . Chicken pox   . Diverticulitis of colon (without mention of hemorrhage)(562.11)   . E coli enteritis   . GERD (gastroesophageal reflux disease)   . Hemorrhoids    internal  . Measles   . Osteoarthrosis, unspecified whether generalized or localized, unspecified site   . Symptomatic menopausal or female climacteric states   . Unspecified asthma(493.90)   . Unspecified essential hypertension   . Unspecified hypothyroidism     Past Surgical History:  Procedure Laterality Date  . cardiac catherization  12/09/2008   normal coranaries,normal EF.  . dilatation and curettage     due to SAB x 2  . thyroid nodule  1990   benign  . TOTAL ABDOMINAL HYSTERECTOMY W/ BILATERAL SALPINGOOPHORECTOMY  2003   complex endometrial hyperplasia, DUB, fibroids  . WISDOM TOOTH EXTRACTION      Family History  Problem Relation Age of Onset  . Heart disease Mother    CAD  . Stroke Mother        x 2  . Mental illness Mother        not diagnosed  . Irritable bowel syndrome Mother   . Heart disease Father        CAD  . Parkinsonism Father   . Cancer Sister        Breast  . Cancer Unknown        Breast and Ovarian  . Heart disease Brother   . Schizophrenia Sister   . Lung disease Sister     Social History   Socioeconomic History  . Marital status: Married    Spouse name: Not on file  . Number of children: 2  . Years of education: masters  . Highest education level: Not on file  Occupational History  . Occupation: Retired    Comment: Research officer, political party Needs  . Financial resource strain: Not on file  . Food insecurity:    Worry: Not on file    Inability: Not on file  . Transportation needs:    Medical: Not on file    Non-medical: Not on file  Tobacco Use  . Smoking status: Never Smoker  . Smokeless tobacco: Never Used  Substance and Sexual Activity  . Alcohol use: No  . Drug use: No  . Sexual activity: Not on file  Lifestyle  . Physical activity:  Days per week: Not on file    Minutes per session: Not on file  . Stress: Not on file  Relationships  . Social connections:    Talks on phone: Not on file    Gets together: Not on file    Attends religious service: Not on file    Active member of club or organization: Not on file    Attends meetings of clubs or organizations: Not on file    Relationship status: Not on file  . Intimate partner violence:    Fear of current or ex partner: Not on file    Emotionally abused: Not on file    Physically abused: Not on file    Forced sexual activity: Not on file  Other Topics Concern  . Not on file  Social History Narrative   Always uses seat belts. Smoke alarm in the home. No caffeine use. 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 Married x 15 years;3rd marriage. Happily married. Husband with bladder cancer and possible gastric  cancer 2012-2013. Has 2 children, 2 grandchildren.    Outpatient Medications Prior to Visit  Medication Sig Dispense Refill  . acetaminophen (TYLENOL 8 HOUR) 650 MG CR tablet Take 1,300 mg by mouth as needed for pain.    Marland Kitchen aspirin 81 MG tablet Take 81 mg by mouth daily.    Marland Kitchen CALCIUM-MAGNESUIUM-ZINC 333-133-8.3 MG TABS Take 3 tablets by mouth daily.    Marland Kitchen CANNABIDIOL PO Take 30 mg by mouth daily.    . diclofenac sodium (VOLTAREN) 1 % GEL Apply 2 g topically 4 (four) times daily as needed.    . fluticasone (FLONASE) 50 MCG/ACT nasal spray Place 2 sprays into the nose daily. (Patient taking differently: Place 2 sprays into the nose daily as needed for allergies. ) 16 g 2  . levothyroxine (SYNTHROID, LEVOTHROID) 75 MCG tablet TAKE 1 TABLET BY MOUTH EVERY DAY ON AN EMPTY STOMACH 90 tablet 1  . montelukast (SINGULAIR) 10 MG tablet TAKE 1 TABLET BY MOUTH IN THE EVENING 90 tablet 3  . Multiple Vitamin (MULTIVITAMIN) tablet Take 1 tablet by mouth daily.    . pantoprazole (PROTONIX) 20 MG tablet Take 20 mg by mouth daily.    Marland Kitchen albuterol (PROAIR HFA) 108 (90 BASE) MCG/ACT inhaler Inhale 2 puffs into the lungs every 6 (six) hours as needed.    . Cholecalciferol (VITAMIN D3) 2000 units TABS Take 2,000 Units by mouth daily.      No facility-administered medications prior to visit.     Allergies  Allergen Reactions  . Claritin [Loratadine]     Panic attacks  . Fructose Diarrhea  . Gluten Meal Other (See Comments)    GI UPSET - CRAMPING  . Lactase Other (See Comments)    Pain, bloating abdominal pain  . Levaquin [Levofloxacin] Nausea Only    Yeast infection  . Sulfa Drugs Cross Reactors Hives    Review of Systems  Constitutional: Negative.   HENT: Negative.   Respiratory: Positive for cough and wheezing.   Cardiovascular: Negative.        Objective:    Physical Exam  Constitutional: She is oriented to person, place, and time. She appears well-developed and well-nourished.    Cardiovascular: Normal rate and regular rhythm.  Pulmonary/Chest: No respiratory distress. She has wheezes. She has no rales.  Decreased airflow bilaterally that improves after duoneb treatment in office.   Neurological: She is alert and oriented to person, place, and time.  Skin: Skin is warm and dry.  Psychiatric: She has a normal mood and affect. Her behavior is normal.    BP 106/60 (BP Location: Left Arm, Patient Position: Sitting, Cuff Size: Normal)   Pulse 71   Temp (!) 97.5 F (36.4 C) (Oral)   Resp 16   Ht 5\' 7"  (1.702 m)   Wt 135 lb (61.2 kg)   SpO2 98%   BMI 21.14 kg/m  Wt Readings from Last 3 Encounters:  09/10/18 135 lb (61.2 kg)  07/01/18 141 lb (64 kg)  06/26/18 139 lb 9.6 oz (63.3 kg)    Health Maintenance Due  Topic Date Due  . Hepatitis C Screening  [birth year]  . DEXA SCAN  03/26/2013  . PNA vac Low Risk Adult (1 of 2 - PCV13) 05/01/2013  . MAMMOGRAM  12/25/2013    There are no preventive care reminders to display for this patient.   Lab Results  Component Value Date   TSH 0.863 07/01/2018   Lab Results  Component Value Date   WBC 5.7 07/01/2018   HGB 14.2 07/01/2018   HCT 41.5 07/01/2018   MCV 94 07/01/2018   PLT 283 07/01/2018   Lab Results  Component Value Date   NA 141 07/01/2018   K 3.9 07/01/2018   CO2 26 07/01/2018   GLUCOSE 87 07/01/2018   BUN 24 07/01/2018   CREATININE 0.87 07/01/2018   BILITOT 0.6 04/30/2018   ALKPHOS 71 04/30/2018   AST 27 04/30/2018   ALT 21 04/30/2018   PROT 6.6 04/30/2018   ALBUMIN 4.3 04/30/2018   CALCIUM 9.7 07/01/2018   ANIONGAP 10 05/01/2018   Lab Results  Component Value Date   CHOL 208 (A) 12/26/2011   Lab Results  Component Value Date   HDL 63 12/26/2011   Lab Results  Component Value Date   LDLCALC 109 12/26/2011   Lab Results  Component Value Date   TRIG 182 (A) 12/26/2011   Lab Results  Component Value Date   CHOLHDL 3.1 12/08/2008   Lab Results  Component Value Date    HGBA1C 5.5 12/26/2011       Assessment & Plan:  1. Exacerbation of persistent asthma, unspecified asthma severity  Treated today in office with duoneb with moderate resolution. Follow up roughly 4 months with Tawanna Sat for maintenance or sooner if necessary.   - ipratropium-albuterol (DUONEB) 0.5-2.5 (3) MG/3ML nebulizer solution 3 mL  2. Persistent asthma without complication, unspecified asthma severity  Will have her start maintenance inhaler as below. She declines steroids. Will try and get nebulizer for her as she reports significant more relief and an easier time using nebulizer treatment than inhaler.   - fluticasone furoate-vilanterol (BREO ELLIPTA) 100-25 MCG/INH AEPB; Inhale 1 puff into the lungs daily.  Dispense: 90 each; Refill: 0 - albuterol (PROVENTIL HFA;VENTOLIN HFA) 108 (90 Base) MCG/ACT inhaler; Inhale 2 puffs into the lungs every 6 (six) hours as needed for wheezing or shortness of breath.  Dispense: 1 Inhaler; Refill: 1  Meds ordered this encounter  Medications  . ipratropium-albuterol (DUONEB) 0.5-2.5 (3) MG/3ML nebulizer solution 3 mL  . fluticasone furoate-vilanterol (BREO ELLIPTA) 100-25 MCG/INH AEPB    Sig: Inhale 1 puff into the lungs daily.    Dispense:  90 each    Refill:  0    Order Specific Question:   Supervising Provider    Answer:   Birdie Sons [[id number]]  . albuterol (PROVENTIL HFA;VENTOLIN HFA) 108 (90 Base) MCG/ACT inhaler    Sig:  Inhale 2 puffs into the lungs every 6 (six) hours as needed for wheezing or shortness of breath.    Dispense:  1 Inhaler    Refill:  1    Order Specific Question:   Supervising Provider    Answer:   Birdie Sons [[id number]]   The entirety of the information documented in the History of Present Illness, Review of Systems and Physical Exam were personally obtained by me. Portions of this information were initially documented by Lynford Humphrey, CMA and reviewed by me for thoroughness and accuracy.    Trinna Post,  PA-C

## 2018-09-10 NOTE — Patient Instructions (Signed)

## 2018-09-21 ENCOUNTER — Telehealth: Payer: Self-pay | Admitting: Physician Assistant

## 2018-09-21 ENCOUNTER — Other Ambulatory Visit: Payer: Self-pay | Admitting: Physician Assistant

## 2018-09-21 ENCOUNTER — Ambulatory Visit (INDEPENDENT_AMBULATORY_CARE_PROVIDER_SITE_OTHER): Payer: Medicare Other | Admitting: Physician Assistant

## 2018-09-21 ENCOUNTER — Encounter: Payer: Self-pay | Admitting: Physician Assistant

## 2018-09-21 VITALS — BP 100/66 | HR 72 | Resp 16 | Wt 137.0 lb

## 2018-09-21 DIAGNOSIS — J45901 Unspecified asthma with (acute) exacerbation: Secondary | ICD-10-CM

## 2018-09-21 DIAGNOSIS — L719 Rosacea, unspecified: Secondary | ICD-10-CM

## 2018-09-21 MED ORDER — IPRATROPIUM-ALBUTEROL 0.5-2.5 (3) MG/3ML IN SOLN: 3.0000 mL | RESPIRATORY_TRACT | 1 refills | Status: DC | PRN

## 2018-09-21 MED ORDER — METRONIDAZOLE 0.75 % EX CREA: TOPICAL_CREAM | Freq: Two times a day (BID) | CUTANEOUS | 0 refills | Status: DC

## 2018-09-21 NOTE — Patient Instructions (Signed)
Rosacea Rosacea is a long-term (chronic) condition that affects the skin of the face, including the cheeks, nose, brow, and chin. This condition can also affect the eyes. Rosacea causes blood vessels near the surface of the skin to enlarge, which results in redness. What are the causes? The cause of this condition is not known. Certain triggers can make rosacea worse, including:  Hot baths.  Exercise.  Sunlight.  Very hot or cold temperatures.  Hot or spicy foods and drinks.  Drinking alcohol.  Stress.  Taking blood pressure medicine.  Long-term use of topical steroids on the face.  What increases the risk? This condition is more likely to develop in:  People who are older than 70 years of age.  Women.  People who have light-colored skin (light complexion).  People who have a family history of rosacea.  What are the signs or symptoms? Symptoms of this condition include:  Redness of the face.  Red bumps or pimples on the face.  A red, enlarged nose.  Blushing easily.  Red lines on the skin.  Irritated or burning feeling in the eyes.  Swollen eyelids.  How is this diagnosed? This condition is diagnosed with a medical history and physical exam. How is this treated? There is no cure for this condition, but treatment can help to control your symptoms. Your health care provider may recommend that you see a skin specialist (dermatologist). Treatment may include:  Antibiotic medicines that are applied to the skin or taken as a pill.  Laser treatment to improve the appearance of the skin.  Surgery. This is rare.  Your health care provider will also recommend the best way to take care of your skin. Even after your skin improves, you will likely need to continue treatment to prevent your rosacea from coming back. Follow these instructions at home: Skin Care Take care of your skin as told by your health care provider. You may be told to do these things:  Wash  your skin gently two or more times each day.  Use mild soap.  Use a sunscreen or sunblock with SPF 30 or greater.  Use gentle cosmetics that are meant for sensitive skin.  Shave with an electric shaver instead of a blade.  Lifestyle  Try to keep track of what foods trigger this condition. Avoid any triggers. These may include: ? Spicy foods. ? Seafood. ? Cheese. ? Hot liquids. ? Nuts. ? Chocolate. ? Iodized salt.  Do not drink alcohol.  Avoid extremely cold or hot temperatures.  Try to reduce your stress. If you need help, talk with your health care provider.  When you exercise, do these things to stay cool: ? Limit your sun exposure. ? Use a fan. ? Do shorter and more frequent intervals of exercise. General instructions  Keep all follow-up visits as told by your health care provider. This is important.  Take over-the-counter and prescription medicines only as told by your health care provider.  If your eyelids are affected, apply warm compresses to them. Do this as told by your health care provider.  If you were prescribed an antibiotic medicine, apply or take it as told by your health care provider. Do not stop using the antibiotic even if your condition improves. Contact a health care provider if:  Your symptoms get worse.  Your symptoms do not improve after two months of treatment.  You have new symptoms.  You have any changes in vision or you have problems with your eyes, such as   redness or itching.  You feel depressed.  You lose your appetite.  You have trouble concentrating. This information is not intended to replace advice given to you by your health care provider. Make sure you discuss any questions you have with your health care provider. Document Released: 11/14/2004 Document Revised: 03/14/2016 Document Reviewed: 12/14/2014 Elsevier Interactive Patient Education  2018 [employer]  

## 2018-09-21 NOTE — Telephone Encounter (Signed)
Pt's insurance will not cover: Ipratropium-albuteral (duonub).  Pt is wanting to know what else can be prescribed.  Please advise.  Thanks, [employer]

## 2018-09-21 NOTE — Telephone Encounter (Signed)
Patient advised as below.  

## 2018-09-21 NOTE — Telephone Encounter (Signed)
Pharmacy already requested alternative for her. This was sent earlier today.

## 2018-09-21 NOTE — Progress Notes (Signed)
Established Patient Office Visit  Subjective:  Patient ID: Kimberly Mercer, female    DOB: [birth year]  Age: 72 y.o. MRN: [id number]  CC:  Chief Complaint  Patient presents with  . Follow-up    HPI  Kimberly Mercer presents for Follow up for asthma  The patient was last seen for this 2 weeks ago. Changes made at last visit include patient started on Breo and nebulizer prescribed per patient preference.  She reports she has not started Breo due to cost. She feels that condition is Improved. She is not having side effects.  ------------------------------------------------------------------------------------     Past Medical History:  Diagnosis Date  . Allergic rhinitis, cause unspecified   . Allergy   . Asthma   . Benign neoplasm of colon   . Cancer ([place])   . Candidiasis of the esophagus   . Chicken pox   . Diverticulitis of colon (without mention of hemorrhage)(562.11)   . E coli enteritis   . GERD (gastroesophageal reflux disease)   . Hemorrhoids    internal  . Measles   . Osteoarthrosis, unspecified whether generalized or localized, unspecified site   . Symptomatic menopausal or female climacteric states   . Unspecified asthma(493.90)   . Unspecified essential hypertension   . Unspecified hypothyroidism     Past Surgical History:  Procedure Laterality Date  . cardiac catherization  12/09/2008   normal coranaries,normal EF.  . dilatation and curettage     due to SAB x 2  . thyroid nodule  1990   benign  . TOTAL ABDOMINAL HYSTERECTOMY W/ BILATERAL SALPINGOOPHORECTOMY  2003   complex endometrial hyperplasia, DUB, fibroids  . WISDOM TOOTH EXTRACTION      Family History  Problem Relation Age of Onset  . Heart disease Mother        CAD  . Stroke Mother        x 2  . Mental illness Mother        not diagnosed  . Irritable bowel syndrome Mother   . Heart disease Father        CAD  . Parkinsonism Father   . Cancer Sister        Breast  . Cancer  Unknown        Breast and Ovarian  . Heart disease Brother   . Schizophrenia Sister   . Lung disease Sister     Social History   Socioeconomic History  . Marital status: Married    Spouse name: Not on file  . Number of children: 2  . Years of education: masters  . Highest education level: Not on file  Occupational History  . Occupation: Retired    Comment: Research officer, political party Needs  . Financial resource strain: Not on file  . Food insecurity:    Worry: Not on file    Inability: Not on file  . Transportation needs:    Medical: Not on file    Non-medical: Not on file  Tobacco Use  . Smoking status: Never Smoker  . Smokeless tobacco: Never Used  Substance and Sexual Activity  . Alcohol use: No  . Drug use: No  . Sexual activity: Not on file  Lifestyle  . Physical activity:    Days per week: Not on file    Minutes per session: Not on file  . Stress: Not on file  Relationships  . Social connections:    Talks on phone: Not on file    Gets together: Not on  file    Attends religious service: Not on file    Active member of club or organization: Not on file    Attends meetings of clubs or organizations: Not on file    Relationship status: Not on file  . Intimate partner violence:    Fear of current or ex partner: Not on file    Emotionally abused: Not on file    Physically abused: Not on file    Forced sexual activity: Not on file  Other Topics Concern  . Not on file  Social History Narrative   Always uses seat belts. Smoke alarm in the home. No caffeine use. 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 Married x 15 years;3rd marriage. Happily married. Husband with bladder cancer and possible gastric cancer 2012-2013. Has 2 children, 2 grandchildren.    Outpatient Medications Prior to Visit  Medication Sig Dispense Refill  . acetaminophen (TYLENOL 8 HOUR) 650 MG CR tablet Take 1,300 mg by mouth as needed for pain.      Marland Kitchen albuterol (PROVENTIL HFA;VENTOLIN HFA) 108 (90 Base) MCG/ACT inhaler Inhale 2 puffs into the lungs every 6 (six) hours as needed for wheezing or shortness of breath. 1 Inhaler 1  . aspirin 81 MG tablet Take 81 mg by mouth daily.    Marland Kitchen CALCIUM-MAGNESUIUM-ZINC 333-133-8.3 MG TABS Take 3 tablets by mouth daily.    . Cholecalciferol (VITAMIN D3) 2000 units TABS Take 2,000 Units by mouth daily.     . diclofenac sodium (VOLTAREN) 1 % GEL Apply 2 g topically 4 (four) times daily as needed.    . fluticasone (FLONASE) 50 MCG/ACT nasal spray Place 2 sprays into the nose daily. (Patient taking differently: Place 2 sprays into the nose daily as needed for allergies. ) 16 g 2  . levothyroxine (SYNTHROID, LEVOTHROID) 75 MCG tablet TAKE 1 TABLET BY MOUTH EVERY DAY ON AN EMPTY STOMACH 90 tablet 1  . montelukast (SINGULAIR) 10 MG tablet TAKE 1 TABLET BY MOUTH IN THE EVENING 90 tablet 3  . Multiple Vitamin (MULTIVITAMIN) tablet Take 1 tablet by mouth daily.    . pantoprazole (PROTONIX) 20 MG tablet Take 20 mg by mouth daily.    Marland Kitchen CANNABIDIOL PO Take 30 mg by mouth daily.    . fluticasone furoate-vilanterol (BREO ELLIPTA) 100-25 MCG/INH AEPB Inhale 1 puff into the lungs daily. (Patient not taking: Reported on 09/21/2018) 90 each 0  . ipratropium-albuterol (DUONEB) 0.5-2.5 (3) MG/3ML nebulizer solution 3 mL      No facility-administered medications prior to visit.     Allergies  Allergen Reactions  . Claritin [Loratadine]     Panic attacks  . Fructose Diarrhea  . Gluten Meal Other (See Comments)    GI UPSET - CRAMPING  . Lactase Other (See Comments)    Pain, bloating abdominal pain  . Levaquin [Levofloxacin] Nausea Only    Yeast infection  . Sulfa Drugs Cross Reactors Hives    ROS Review of Systems  Constitutional: Negative.   HENT: Positive for congestion, postnasal drip and rhinorrhea.   Respiratory: Negative.   Cardiovascular: Negative.   Gastrointestinal: Negative.   Allergic/Immunologic:  Positive for environmental allergies.      Objective:    Physical Exam  Constitutional: She appears well-developed and well-nourished. No distress.  Neck: Normal range of motion. Neck supple.  Cardiovascular: Normal rate, regular rhythm and normal heart sounds. Exam reveals no gallop and no friction rub.  No murmur heard. Pulmonary/Chest: Effort normal and  breath sounds normal. No respiratory distress. She has no wheezes. She has no rales.  Skin: Rash (malar rash noted on face c/w rosacea appearance) noted. She is not diaphoretic.  Vitals reviewed.   BP 100/66 (BP Location: Left Arm, Patient Position: Sitting, Cuff Size: Normal)   Pulse 72   Resp 16   Wt 137 lb (62.1 kg)   SpO2 97%   BMI 21.46 kg/m  Wt Readings from Last 3 Encounters:  09/21/18 137 lb (62.1 kg)  09/10/18 135 lb (61.2 kg)  07/01/18 141 lb (64 kg)     Health Maintenance Due  Topic Date Due  . Hepatitis C Screening  Sep 27, 1946  . DEXA SCAN  03/26/2013  . PNA vac Low Risk Adult (1 of 2 - PCV13) 05/01/2013  . MAMMOGRAM  12/25/2013    There are no preventive care reminders to display for this patient.  Lab Results  Component Value Date   TSH 0.863 07/01/2018   Lab Results  Component Value Date   WBC 5.7 07/01/2018   HGB 14.2 07/01/2018   HCT 41.5 07/01/2018   MCV 94 07/01/2018   PLT 283 07/01/2018   Lab Results  Component Value Date   NA 141 07/01/2018   K 3.9 07/01/2018   CO2 26 07/01/2018   GLUCOSE 87 07/01/2018   BUN 24 07/01/2018   CREATININE 0.87 07/01/2018   BILITOT 0.6 04/30/2018   ALKPHOS 71 04/30/2018   AST 27 04/30/2018   ALT 21 04/30/2018   PROT 6.6 04/30/2018   ALBUMIN 4.3 04/30/2018   CALCIUM 9.7 07/01/2018   ANIONGAP 10 05/01/2018   Lab Results  Component Value Date   CHOL 208 (A) 12/26/2011   Lab Results  Component Value Date   HDL 63 12/26/2011   Lab Results  Component Value Date   LDLCALC 109 12/26/2011   Lab Results  Component Value Date   TRIG 182 (A)  12/26/2011   Lab Results  Component Value Date   CHOLHDL 3.1 12/08/2008   Lab Results  Component Value Date   HGBA1C 5.5 12/26/2011      Assessment & Plan:   Added Duoneb as below for asthma. Patient now has nebulizer machine at home.   New rash on face. Worsening. Appears to be rosacea. Discussed trying metronidazole cream as below. Call if no improvements or symptoms worsen.   Problem List Items Addressed This Visit      Respiratory   Asthma - Primary   Relevant Medications   ipratropium-albuterol (DUONEB) 0.5-2.5 (3) MG/3ML SOLN    Other Visit Diagnoses    Rosacea       Relevant Medications   metroNIDAZOLE (METROCREAM) 0.75 % cream      Meds ordered this encounter  Medications  . ipratropium-albuterol (DUONEB) 0.5-2.5 (3) MG/3ML SOLN    Sig: Take 3 mLs by nebulization every 4 (four) hours as needed.    Dispense:  360 mL    Refill:  1    Order Specific Question:   Supervising Provider    Answer:   Jerrol Banana [id number]  . metroNIDAZOLE (METROCREAM) 0.75 % cream    Sig: Apply topically 2 (two) times daily.    Dispense:  45 g    Refill:  0    Order Specific Question:   Supervising Provider    Answer:   Jerrol Banana [[id number]]    Follow-up: Return if symptoms worsen or fail to improve.   The entirety of the information documented in the  History of Present Illness, Review of Systems and Physical Exam were personally obtained by me. Portions of this information were initially documented by Lendon Ka, CMA and reviewed by me for thoroughness and accuracy.   Mar Daring, PA-C

## 2018-09-23 ENCOUNTER — Telehealth: Payer: Self-pay | Admitting: Physician Assistant

## 2018-09-23 DIAGNOSIS — J45901 Unspecified asthma with (acute) exacerbation: Secondary | ICD-10-CM

## 2018-09-23 MED ORDER — FLUTICASONE-SALMETEROL 100-50 MCG/DOSE IN AEPB: 1.0000 | INHALATION_SPRAY | Freq: Two times a day (BID) | RESPIRATORY_TRACT | 1 refills | Status: DC

## 2018-09-23 MED ORDER — IPRATROPIUM BROMIDE 0.02 % IN SOLN: 0.2500 mg | RESPIRATORY_TRACT | 11 refills | Status: DC | PRN

## 2018-09-23 NOTE — Telephone Encounter (Signed)
ipratropium (ATROVENT) 0.02 % nebulizer solution  Pharmacy is needing the following faxed to fill the above Rx:  specific directions for the drug diagnosis code doctor NPI number specific quantity dispensed  Please advise.  Thanks, [employer]

## 2018-09-23 NOTE — Telephone Encounter (Signed)
[facility] in [place]

## 2018-09-23 NOTE — Telephone Encounter (Signed)
Sent in

## 2018-09-23 NOTE — Telephone Encounter (Signed)
Patient advised as below.  

## 2018-09-23 NOTE — Telephone Encounter (Signed)
Printed for her

## 2018-09-23 NOTE — Telephone Encounter (Signed)
Patient called stating she needs a hard copy of a prescription for Generic Advair.  She wants to pick it up and take to Pharmacy. Please call when ready.

## 2018-09-29 ENCOUNTER — Telehealth: Payer: Self-pay | Admitting: Physician Assistant

## 2018-09-29 DIAGNOSIS — L719 Rosacea, unspecified: Secondary | ICD-10-CM

## 2018-09-29 NOTE — Telephone Encounter (Signed)
metroNIDAZOLE (METROCREAM) 0.75 % cream is costing $100. Out of pocket.  Pt asking if Mupirocin could be called in in place of the metroNIDAZOLE (METROCREAM) 0.75 % cream.   Mupirocin was called in a while back and she says this is working for now.  She is needing more since she doesn't have much left to treat herself.  Please advise.  Thanks, [employer]

## 2018-09-29 NOTE — Telephone Encounter (Signed)
Please review

## 2018-09-30 ENCOUNTER — Other Ambulatory Visit: Payer: Self-pay | Admitting: Physician Assistant

## 2018-09-30 DIAGNOSIS — L719 Rosacea, unspecified: Secondary | ICD-10-CM

## 2018-09-30 MED ORDER — IVERMECTIN 1 % EX CREA: 1.0000 | TOPICAL_CREAM | Freq: Two times a day (BID) | CUTANEOUS | 1 refills | Status: DC

## 2018-09-30 NOTE — Telephone Encounter (Signed)
Sent in Ivermectin cream

## 2018-10-02 ENCOUNTER — Other Ambulatory Visit: Payer: Self-pay | Admitting: Physician Assistant

## 2018-10-02 ENCOUNTER — Telehealth: Payer: Self-pay | Admitting: Physician Assistant

## 2018-10-02 DIAGNOSIS — L719 Rosacea, unspecified: Secondary | ICD-10-CM

## 2018-10-02 MED ORDER — MUPIROCIN CALCIUM 2 % EX CREA: 1.0000 "application " | TOPICAL_CREAM | Freq: Two times a day (BID) | CUTANEOUS | 0 refills | Status: DC

## 2018-10-02 NOTE — Telephone Encounter (Signed)
Sent in for rosacea

## 2018-10-02 NOTE — Telephone Encounter (Signed)
We received a fax from [facility] requesting a new Rx for Mupirocin 2% ointment for rash on her face.  This is all of the information that the fax provided.

## 2018-10-07 ENCOUNTER — Other Ambulatory Visit: Payer: Self-pay | Admitting: Physician Assistant

## 2018-10-07 DIAGNOSIS — J45909 Unspecified asthma, uncomplicated: Secondary | ICD-10-CM

## 2018-10-08 ENCOUNTER — Other Ambulatory Visit: Payer: Self-pay | Admitting: Physician Assistant

## 2018-10-08 DIAGNOSIS — J45909 Unspecified asthma, uncomplicated: Secondary | ICD-10-CM

## 2018-10-24 DIAGNOSIS — R05 Cough: Secondary | ICD-10-CM | POA: Diagnosis not present

## 2018-10-24 DIAGNOSIS — J189 Pneumonia, unspecified organism: Secondary | ICD-10-CM | POA: Diagnosis not present

## 2018-10-26 ENCOUNTER — Encounter: Payer: Self-pay | Admitting: Family Medicine

## 2018-10-26 ENCOUNTER — Ambulatory Visit (INDEPENDENT_AMBULATORY_CARE_PROVIDER_SITE_OTHER): Payer: Medicare Other | Admitting: Family Medicine

## 2018-10-26 VITALS — BP 102/70 | HR 76 | Temp 97.6°F | Resp 16 | Wt 137.0 lb

## 2018-10-26 DIAGNOSIS — J069 Acute upper respiratory infection, unspecified: Secondary | ICD-10-CM

## 2018-10-26 DIAGNOSIS — R197 Diarrhea, unspecified: Secondary | ICD-10-CM

## 2018-10-26 NOTE — Progress Notes (Signed)
Patient: Kimberly Mercer Female    DOB: November 04, 1945   73 y.o.   MRN: [id number] Visit Date: 10/26/2018  Today's Provider: Vernie Murders, PA   No chief complaint on file.  Subjective:     HPI  Patient reports that she went to urgent care Thursday after Christmas. She thought that she had the Flu at that time. She reports that this Saturday she went to urgent care again and is being treated with Antibiotic and Duoneb for possible pneumonia. Patient reports that she also developed diarrhea better yesterday after noon but woke up with diarrhea again. Patient reports that she is feeling weak, she is having URI symptoms. The antibiotic Azythromycin and Augmentin was started at the Urgent Care clinic for suspected pneumonia because of having fever up to 102 with flu-type symptoms over Christmas.  Past Medical History:  Diagnosis Date  . Allergic rhinitis, cause unspecified   . Allergy   . Asthma   . Benign neoplasm of colon   . Cancer ([place])   . Candidiasis of the esophagus   . Chicken pox   . Diverticulitis of colon (without mention of hemorrhage)(562.11)   . E coli enteritis   . GERD (gastroesophageal reflux disease)   . Hemorrhoids    internal  . Measles   . Osteoarthrosis, unspecified whether generalized or localized, unspecified site   . Symptomatic menopausal or female climacteric states   . Unspecified asthma(493.90)   . Unspecified essential hypertension   . Unspecified hypothyroidism    Past Surgical History:  Procedure Laterality Date  . cardiac catherization  12/09/2008   normal coranaries,normal EF.  . dilatation and curettage     due to SAB x 2  . thyroid nodule  1990   benign  . TOTAL ABDOMINAL HYSTERECTOMY W/ BILATERAL SALPINGOOPHORECTOMY  2003   complex endometrial hyperplasia, DUB, fibroids  . WISDOM TOOTH EXTRACTION     Family History  Problem Relation Age of Onset  . Heart disease Mother        CAD  . Stroke Mother        x 2  . Mental illness  Mother        not diagnosed  . Irritable bowel syndrome Mother   . Heart disease Father        CAD  . Parkinsonism Father   . Cancer Sister        Breast  . Cancer Other        Breast and Ovarian  . Heart disease Brother   . Schizophrenia Sister   . Lung disease Sister    Allergies  Allergen Reactions  . Claritin [Loratadine]     Panic attacks  . Fructose Diarrhea  . Gluten Meal Other (See Comments)    GI UPSET - CRAMPING  . Lactase Other (See Comments)    Pain, bloating abdominal pain  . Levaquin [Levofloxacin] Nausea Only    Yeast infection  . Sulfa Drugs Cross Reactors Hives    Current Outpatient Medications:  .  acetaminophen (TYLENOL 8 HOUR) 650 MG CR tablet, Take 1,300 mg by mouth as needed for pain., Disp: , Rfl:  .  albuterol (PROVENTIL HFA;VENTOLIN HFA) 108 (90 Base) MCG/ACT inhaler, Inhale 2 puffs into the lungs every 6 (six) hours as needed for wheezing or shortness of breath., Disp: 1 Inhaler, Rfl: 1 .  aspirin 81 MG tablet, Take 81 mg by mouth daily., Disp: , Rfl:  .  CALCIUM-MAGNESUIUM-ZINC 333-133-8.3 MG TABS, Take  3 tablets by mouth daily., Disp: , Rfl:  .  CANNABIDIOL PO, Take 30 mg by mouth daily., Disp: , Rfl:  .  Cholecalciferol (VITAMIN D3) 2000 units TABS, Take 2,000 Units by mouth daily. , Disp: , Rfl:  .  diclofenac sodium (VOLTAREN) 1 % GEL, Apply 2 g topically 4 (four) times daily as needed., Disp: , Rfl:  .  fluticasone (FLONASE) 50 MCG/ACT nasal spray, Place 2 sprays into the nose daily. (Patient taking differently: Place 2 sprays into the nose daily as needed for allergies. ), Disp: 16 g, Rfl: 2 .  Fluticasone-Salmeterol (ADVAIR) 100-50 MCG/DOSE AEPB, Inhale 1 puff into the lungs 2 (two) times daily., Disp: 180 each, Rfl: 1 .  ipratropium (ATROVENT) 0.02 % nebulizer solution, Take 1.25 mLs (0.25 mg total) by nebulization every 4 (four) hours as needed for wheezing or shortness of breath., Disp: 2.5 mL, Rfl: 11 .  levothyroxine (SYNTHROID,  LEVOTHROID) 75 MCG tablet, TAKE 1 TABLET BY MOUTH EVERY DAY ON AN EMPTY STOMACH, Disp: 90 tablet, Rfl: 1 .  montelukast (SINGULAIR) 10 MG tablet, TAKE 1 TABLET BY MOUTH IN THE EVENING, Disp: 90 tablet, Rfl: 3 .  Multiple Vitamin (MULTIVITAMIN) tablet, Take 1 tablet by mouth daily., Disp: , Rfl:  .  pantoprazole (PROTONIX) 20 MG tablet, Take 20 mg by mouth daily., Disp: , Rfl:  .  BREO ELLIPTA 100-25 MCG/INH AEPB, TAKE 1 PUFF BY MOUTH EVERY DAY (Patient not taking: Reported on 10/26/2018), Disp: 60 each, Rfl: 0 .  mupirocin ointment (BACTROBAN) 2 %, Apply topically 2 (two) times daily. (Patient not taking: Reported on 10/26/2018), Disp: 15 g, Rfl: 0  Review of Systems  Constitutional: Positive for fatigue.  HENT: Positive for congestion.   Eyes: Negative.   Respiratory: Positive for cough.   Cardiovascular: Negative.   Gastrointestinal: Positive for diarrhea.  Genitourinary: Negative.     Social History   Tobacco Use  . Smoking status: Never Smoker  . Smokeless tobacco: Never Used  Substance Use Topics  . Alcohol use: No     Objective:   BP 102/70 (BP Location: Right Arm, Patient Position: Sitting, Cuff Size: Normal)   Pulse 76   Temp 97.6 F (36.4 C) (Oral)   Resp 16   Wt 137 lb (62.1 kg)   SpO2 98%   BMI 21.46 kg/m    Wt Readings from Last 3 Encounters:  10/26/18 137 lb (62.1 kg)  09/21/18 137 lb (62.1 kg)  09/10/18 135 lb (61.2 kg)   Vitals:   10/26/18 0854  BP: 102/70  Pulse: 76  Resp: 16  Temp: 97.6 F (36.4 C)  TempSrc: Oral  SpO2: 98%  Weight: 137 lb (62.1 kg)   Physical Exam Constitutional:      General: She is not in acute distress.    Appearance: She is well-developed.  HENT:     Head: Normocephalic and atraumatic.     Right Ear: Hearing normal.     Left Ear: Hearing normal.     Nose: Nose normal.     Comments: Poor transillumination of the right maxillary sinus with slight discomfort to palpation.    Mouth/Throat:     Pharynx: Oropharynx is clear.   Eyes:     General: Lids are normal. No scleral icterus.       Right eye: No discharge.        Left eye: No discharge.     Conjunctiva/sclera: Conjunctivae normal.  Neck:     Musculoskeletal: Normal range of motion  and neck supple.  Cardiovascular:     Rate and Rhythm: Normal rate and regular rhythm.     Heart sounds: Normal heart sounds.  Pulmonary:     Effort: Pulmonary effort is normal. No respiratory distress.     Breath sounds: Normal breath sounds. No wheezing, rhonchi or rales.  Abdominal:     General: Bowel sounds are normal.     Palpations: Abdomen is soft.     Tenderness: There is no abdominal tenderness.  Musculoskeletal: Normal range of motion.  Skin:    Findings: No lesion or rash.  Neurological:     Mental Status: She is alert and oriented to person, place, and time.  Psychiatric:        Speech: Speech normal.        Behavior: Behavior normal.        Thought Content: Thought content normal.       Assessment & Plan    1. Upper respiratory infection with cough and congestion Developed cough and congestion after flu-like sympotms at Christmas. Went to [facility] on 10-24-18 and was treated for suspected pneumonia with Augmentin and Z-pak. No fever and very little sputum production now. May use inhalers prn wheezing and hold Augmentin. Finish 5 day treatment with Z-pak. Use Mucinex-DM prn cough and increase fluid intake. Check CBC for signs of infection and CMP to rule out dehydration. Recheck pending reports. - CBC with Differential/Platelet - Comprehensive metabolic panel  2. Diarrhea, unspecified type Developed diarrhea a couple times without vomiting or significant abdominal cramps. No blood in stools. May be due to use of Augmentin. This will be held and may finish the Z-pak for cough with congestion. Increase fluid intake and may use Pepto-Bismol prn. Recommend bland diet and check labs. Follow up as needed. - CBC with Differential/Platelet -  Comprehensive metabolic panel     Vernie Murders, PA  [facility]

## 2018-10-27 ENCOUNTER — Telehealth: Payer: Self-pay

## 2018-10-27 DIAGNOSIS — J069 Acute upper respiratory infection, unspecified: Secondary | ICD-10-CM

## 2018-10-27 LAB — CBC WITH DIFFERENTIAL/PLATELET
Basophils Absolute: 0.1 10*3/uL (ref 0.0–0.2)
Basos: 1 %
EOS (ABSOLUTE): 0.1 10*3/uL (ref 0.0–0.4)
Eos: 2 %
Hematocrit: 45.1 % (ref 34.0–46.6)
Hemoglobin: 15.2 g/dL (ref 11.1–15.9)
Immature Grans (Abs): 0 10*3/uL (ref 0.0–0.1)
Immature Granulocytes: 0 %
LYMPHS: 26 %
Lymphocytes Absolute: 1.4 10*3/uL (ref 0.7–3.1)
MCH: 31.3 pg (ref 26.6–33.0)
MCHC: 33.7 g/dL (ref 31.5–35.7)
MCV: 93 fL (ref 79–97)
Monocytes Absolute: 0.5 10*3/uL (ref 0.1–0.9)
Monocytes: 10 %
Neutrophils Absolute: 3.3 10*3/uL (ref 1.4–7.0)
Neutrophils: 61 %
Platelets: 391 10*3/uL (ref 150–450)
RBC: 4.85 x10E6/uL (ref 3.77–5.28)
RDW: 11.6 % — ABNORMAL LOW (ref 11.7–15.4)
WBC: 5.3 10*3/uL (ref 3.4–10.8)

## 2018-10-27 LAB — COMPREHENSIVE METABOLIC PANEL
ALT: 15 IU/L (ref 0–32)
AST: 19 IU/L (ref 0–40)
Albumin/Globulin Ratio: 1.6 (ref 1.2–2.2)
Albumin: 4.2 g/dL (ref 3.5–4.8)
Alkaline Phosphatase: 89 IU/L (ref 39–117)
BUN/Creatinine Ratio: 18 (ref 12–28)
BUN: 14 mg/dL (ref 8–27)
Bilirubin Total: 0.2 mg/dL (ref 0.0–1.2)
CALCIUM: 9.6 mg/dL (ref 8.7–10.3)
CO2: 24 mmol/L (ref 20–29)
Chloride: 103 mmol/L (ref 96–106)
Creatinine, Ser: 0.8 mg/dL (ref 0.57–1.00)
GFR calc Af Amer: 86 mL/min/{1.73_m2} (ref >59)
GFR, EST NON AFRICAN AMERICAN: 75 mL/min/{1.73_m2} (ref >59)
Globulin, Total: 2.6 g/dL (ref 1.5–4.5)
Glucose: 93 mg/dL (ref 65–99)
Potassium: 4.4 mmol/L (ref 3.5–5.2)
Sodium: 142 mmol/L (ref 134–144)
Total Protein: 6.8 g/dL (ref 6.0–8.5)

## 2018-10-27 NOTE — Telephone Encounter (Signed)
Patient advised as directed below.  Is this the correct order for the chest xray?

## 2018-10-27 NOTE — Telephone Encounter (Signed)
Will recheck chest x-ray in 10-14 days to see if antibiotics clears congestion to know if spot was only congestion.

## 2018-10-27 NOTE — Telephone Encounter (Signed)
Patient advised as directed below.  Patient asked about the follow-up on her imaging (Xray report) the spot that she has or that it showed. What "are we going to do about that" patient asked.  Please advise.

## 2018-10-27 NOTE — Telephone Encounter (Signed)
-----   Message from [employer], [place] sent at 10/27/2018  8:00 AM EST ----- All blood tests normal. No sign of bacterial infection, electrolyte imbalance or dehydration. Take the Z-pak for 5 days and hold the Augmentin. Be sure to take the probiotics and may use the Pepto-Bismol as needed. Call report of progress in 5-7 days. If no better, will need follow up with gastroenterologist.

## 2018-10-27 NOTE — Telephone Encounter (Signed)
Yes. I signed the order.

## 2018-10-28 ENCOUNTER — Telehealth: Payer: Self-pay | Admitting: Physician Assistant

## 2018-10-28 NOTE — Telephone Encounter (Signed)
°  Fluticasone-Salmeterol (ADVAIR) 100-50 MCG/DOSE AEPB  Milk protein intolerance

## 2018-10-28 NOTE — Telephone Encounter (Signed)
Patient was advised per Tawanna Sat, it's okay for her to use the Advair.

## 2018-10-28 NOTE — Telephone Encounter (Signed)
Pt wants to know if she can take the Rx since she has a milk protein intolerance?  Please advise.  Thanks, [employer]
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
## 2018-11-09 ENCOUNTER — Ambulatory Visit
Admission: RE | Admit: 2018-11-09 | Discharge: 2018-11-09 | Disposition: A | Discharge status: Home / Self Care | Payer: Medicare Other | Source: Ambulatory Visit | Attending: Family Medicine | Admitting: Family Medicine

## 2018-11-09 ENCOUNTER — Ambulatory Visit
Admission: RE | Admit: 2018-11-09 | Discharge: 2018-11-09 | Disposition: A | Discharge status: Home / Self Care | Payer: Medicare Other | Attending: Family Medicine | Admitting: Family Medicine

## 2018-11-09 ENCOUNTER — Telehealth: Payer: Self-pay

## 2018-11-09 DIAGNOSIS — J069 Acute upper respiratory infection, unspecified: Secondary | ICD-10-CM

## 2018-11-09 DIAGNOSIS — R05 Cough: Secondary | ICD-10-CM | POA: Insufficient documentation

## 2018-11-09 DIAGNOSIS — Z8522 Personal history of malignant neoplasm of nasal cavities, middle ear, and accessory sinuses: Secondary | ICD-10-CM | POA: Diagnosis not present

## 2018-11-09 NOTE — Progress Notes (Signed)
Patient: Kimberly Mercer Female    DOB: 1946-06-05   73 y.o.   MRN: [id number] Visit Date: 11/10/2018  Today's Provider: Vernie Murders, PA   Chief Complaint  Patient presents with  . Cough   Subjective:     HPI  Patient here today with c/o cough,chest tightness and congestion still present.Reports is much better. Patient was seen on 10/26/2018 for URI.  Patient had a chest xray done on 11/09/2018 and  Showed some chronic lung changes without evidence of mass or infection.  Past Medical History:  Diagnosis Date  . Allergic rhinitis, cause unspecified   . Allergy   . Asthma   . Benign neoplasm of colon   . Cancer ([place])   . Candidiasis of the esophagus   . Chicken pox   . Diverticulitis of colon (without mention of hemorrhage)(562.11)   . E coli enteritis   . GERD (gastroesophageal reflux disease)   . Hemorrhoids    internal  . Measles   . Osteoarthrosis, unspecified whether generalized or localized, unspecified site   . Symptomatic menopausal or female climacteric states   . Unspecified asthma(493.90)   . Unspecified essential hypertension   . Unspecified hypothyroidism    Past Surgical History:  Procedure Laterality Date  . cardiac catherization  12/09/2008   normal coranaries,normal EF.  . dilatation and curettage     due to SAB x 2  . thyroid nodule  1990   benign  . TOTAL ABDOMINAL HYSTERECTOMY W/ BILATERAL SALPINGOOPHORECTOMY  2003   complex endometrial hyperplasia, DUB, fibroids  . WISDOM TOOTH EXTRACTION     Family History  Problem Relation Age of Onset  . Heart disease Mother        CAD  . Stroke Mother        x 2  . Mental illness Mother        not diagnosed  . Irritable bowel syndrome Mother   . Heart disease Father        CAD  . Parkinsonism Father   . Cancer Sister        Breast  . Cancer Other        Breast and Ovarian  . Heart disease Brother   . Schizophrenia Sister   . Lung disease Sister    Allergies  Allergen Reactions  .  Claritin [Loratadine]     Panic attacks  . Fructose Diarrhea  . Gluten Meal Other (See Comments)    GI UPSET - CRAMPING  . Lactase Other (See Comments)    Pain, bloating abdominal pain  . Levaquin [Levofloxacin] Nausea Only    Yeast infection  . Sulfa Drugs Cross Reactors Hives    Current Outpatient Medications:  .  acetaminophen (TYLENOL 8 HOUR) 650 MG CR tablet, Take 1,300 mg by mouth as needed for pain., Disp: , Rfl:  .  albuterol (PROVENTIL HFA;VENTOLIN HFA) 108 (90 Base) MCG/ACT inhaler, Inhale 2 puffs into the lungs every 6 (six) hours as needed for wheezing or shortness of breath., Disp: 1 Inhaler, Rfl: 1 .  aspirin 81 MG tablet, Take 81 mg by mouth daily., Disp: , Rfl:  .  BREO ELLIPTA 100-25 MCG/INH AEPB, TAKE 1 PUFF BY MOUTH EVERY DAY, Disp: 60 each, Rfl: 0 .  CALCIUM-MAGNESUIUM-ZINC 333-133-8.3 MG TABS, Take 3 tablets by mouth daily., Disp: , Rfl:  .  CANNABIDIOL PO, Take 30 mg by mouth daily., Disp: , Rfl:  .  Cholecalciferol (VITAMIN D3) 2000 units TABS, Take  2,000 Units by mouth daily. , Disp: , Rfl:  .  diclofenac sodium (VOLTAREN) 1 % GEL, Apply 2 g topically 4 (four) times daily as needed., Disp: , Rfl:  .  fluticasone (FLONASE) 50 MCG/ACT nasal spray, Place 2 sprays into the nose daily. (Patient taking differently: Place 2 sprays into the nose daily as needed for allergies. ), Disp: 16 g, Rfl: 2 .  Fluticasone-Salmeterol (ADVAIR) 100-50 MCG/DOSE AEPB, Inhale 1 puff into the lungs 2 (two) times daily., Disp: 180 each, Rfl: 1 .  ipratropium (ATROVENT) 0.02 % nebulizer solution, Take 1.25 mLs (0.25 mg total) by nebulization every 4 (four) hours as needed for wheezing or shortness of breath., Disp: 2.5 mL, Rfl: 11 .  levothyroxine (SYNTHROID, LEVOTHROID) 75 MCG tablet, TAKE 1 TABLET BY MOUTH EVERY DAY ON AN EMPTY STOMACH, Disp: 90 tablet, Rfl: 1 .  montelukast (SINGULAIR) 10 MG tablet, TAKE 1 TABLET BY MOUTH IN THE EVENING, Disp: 90 tablet, Rfl: 3 .  Multiple Vitamin  (MULTIVITAMIN) tablet, Take 1 tablet by mouth daily., Disp: , Rfl:  .  mupirocin ointment (BACTROBAN) 2 %, Apply topically 2 (two) times daily., Disp: 15 g, Rfl: 0 .  pantoprazole (PROTONIX) 20 MG tablet, Take 20 mg by mouth daily., Disp: , Rfl:   Review of Systems  Constitutional: Negative.   HENT: Negative.   Respiratory: Negative.   Cardiovascular: Negative.   Gastrointestinal: Negative.   All other systems reviewed and are negative.   Social History   Tobacco Use  . Smoking status: Never Smoker  . Smokeless tobacco: Never Used  Substance Use Topics  . Alcohol use: No     Objective:   BP 112/70 (BP Location: Right Arm, Patient Position: Sitting, Cuff Size: Normal)   Pulse 70   Temp 97.6 F (36.4 C) (Oral)   Resp 16   Wt 137 lb 6.4 oz (62.3 kg)   SpO2 98%   BMI 21.52 kg/m    Wt Readings from Last 3 Encounters:  11/10/18 137 lb 6.4 oz (62.3 kg)  10/26/18 137 lb (62.1 kg)  09/21/18 137 lb (62.1 kg)    Vitals:   11/10/18 0944  BP: 112/70  Pulse: 70  Resp: 16  Temp: 97.6 F (36.4 C)  TempSrc: Oral  SpO2: 98%  Weight: 137 lb 6.4 oz (62.3 kg)   Physical Exam Constitutional:      General: She is not in acute distress.    Appearance: Normal appearance. She is well-developed.  HENT:     Head: Normocephalic and atraumatic.     Right Ear: Hearing and tympanic membrane normal.     Left Ear: Hearing and tympanic membrane normal.     Nose: Nose normal.  Eyes:     General: Lids are normal. No scleral icterus.       Right eye: No discharge.        Left eye: No discharge.     Conjunctiva/sclera: Conjunctivae normal.  Neck:     Musculoskeletal: Neck supple.  Cardiovascular:     Rate and Rhythm: Normal rate and regular rhythm.     Heart sounds: Normal heart sounds.  Pulmonary:     Effort: Pulmonary effort is normal. No respiratory distress.  Abdominal:     Palpations: Abdomen is soft.  Musculoskeletal: Normal range of motion.  Lymphadenopathy:     Cervical:  No cervical adenopathy.  Skin:    Findings: No lesion or rash.  Neurological:     Mental Status: She is alert and oriented  to person, place, and time.  Psychiatric:        Speech: Speech normal.        Behavior: Behavior normal.        Thought Content: Thought content normal.       Assessment & Plan    1. Persistent asthma without complication, unspecified asthma severity Feeling much improved today. No further congestion, fever or GI upset. Finished the Azithromycin and continues Advair with Singulair daily. May use Albuterol and Atrovent by nebulizer at home prn flares. Recheck in 3 months. CXR on 11-09-18 and labs on 10-26-18 did not show any lesions or signs of persistent infection.     Vernie Murders, PA  [facility]

## 2018-11-09 NOTE — Telephone Encounter (Signed)
Patient advised as directed below. Per patient she still has chest tightness that she considers remaining of symptoms.

## 2018-11-09 NOTE — Telephone Encounter (Signed)
-----   Message from [place], [place] sent at 11/09/2018  1:00 PM EST ----- Some chronic lung changes without evidence of mass or infection. Recheck appointment if any symptoms remain.

## 2018-11-10 ENCOUNTER — Other Ambulatory Visit: Payer: Self-pay | Admitting: Family Medicine

## 2018-11-10 ENCOUNTER — Ambulatory Visit (INDEPENDENT_AMBULATORY_CARE_PROVIDER_SITE_OTHER): Payer: Medicare Other | Admitting: Family Medicine

## 2018-11-10 ENCOUNTER — Encounter: Payer: Self-pay | Admitting: Family Medicine

## 2018-11-10 VITALS — BP 112/70 | HR 70 | Temp 97.6°F | Resp 16 | Wt 137.4 lb

## 2018-11-10 DIAGNOSIS — J45909 Unspecified asthma, uncomplicated: Secondary | ICD-10-CM

## 2018-11-17 ENCOUNTER — Encounter: Payer: Self-pay | Admitting: Gastroenterology

## 2018-11-17 ENCOUNTER — Other Ambulatory Visit: Payer: Self-pay

## 2018-11-17 ENCOUNTER — Ambulatory Visit (INDEPENDENT_AMBULATORY_CARE_PROVIDER_SITE_OTHER): Payer: Medicare Other | Admitting: Gastroenterology

## 2018-11-17 VITALS — BP 98/62 | HR 79 | Resp 17 | Ht 67.0 in | Wt 135.4 lb

## 2018-11-17 DIAGNOSIS — K219 Gastro-esophageal reflux disease without esophagitis: Secondary | ICD-10-CM

## 2018-11-17 MED ORDER — PANTOPRAZOLE SODIUM 40 MG PO TBEC: 40.0000 mg | DELAYED_RELEASE_TABLET | Freq: Every day | ORAL | 2 refills | Status: DC
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
## 2018-11-23 ENCOUNTER — Telehealth: Payer: Self-pay | Admitting: Family Medicine

## 2018-11-23 NOTE — Telephone Encounter (Signed)
Patient called stating she is having HORRIBLE side effects from Ipratropium.  Said after she uses this it makes her have awful stomach pains and very nauseated.  She cannot eat.   Dr. Marius Ditch put her on Protonix 40 mg. Bid to see if this would help and IT does NOT.  She [place] Advair [place] company.   But in the meantime is there anything else she can use to take the place of Ipratropium?     I called [facility] and they do not sample Advair any longer.   They have Breo, Anoro and Trelegy and I could maybe get her a sample to hold her over if one of those would help her.  She said the Breo was $400+ and she couldn't afford it.   Let me know if you want me to try to get samples- Arbie Cookey

## 2018-11-23 NOTE — Telephone Encounter (Signed)
Please advise 

## 2018-11-24 NOTE — Telephone Encounter (Signed)
Have a sample of Tudorza (one inhalation twice a day) to use instead of the Ipratropium until her Advair comes in next week.

## 2018-11-24 NOTE — Telephone Encounter (Signed)
Patient was advised. Sample at front desk.

## 2018-12-31 ENCOUNTER — Other Ambulatory Visit: Payer: Self-pay | Admitting: Physician Assistant

## 2018-12-31 DIAGNOSIS — J45909 Unspecified asthma, uncomplicated: Secondary | ICD-10-CM

## 2019-01-01 ENCOUNTER — Other Ambulatory Visit: Payer: Self-pay | Admitting: Physician Assistant

## 2019-01-01 NOTE — Telephone Encounter (Signed)
Pt called to check on her refills that [facility] had requested for the  Fluticason-Salmeterol  Albuterol inhaler  CB#  [phone]  Thanks teri

## 2019-01-04 ENCOUNTER — Other Ambulatory Visit: Payer: Self-pay | Admitting: Gastroenterology

## 2019-01-18 ENCOUNTER — Other Ambulatory Visit: Payer: Self-pay

## 2019-01-18 ENCOUNTER — Ambulatory Visit (INDEPENDENT_AMBULATORY_CARE_PROVIDER_SITE_OTHER): Payer: Medicare Other | Admitting: Physician Assistant

## 2019-01-18 ENCOUNTER — Encounter: Payer: Self-pay | Admitting: Physician Assistant

## 2019-01-18 VITALS — BP 138/80 | HR 72 | Temp 97.8°F | Resp 16 | Wt 134.6 lb

## 2019-01-18 DIAGNOSIS — R590 Localized enlarged lymph nodes: Secondary | ICD-10-CM

## 2019-01-18 NOTE — Patient Instructions (Signed)
Lymphadenopathy    Lymphadenopathy means that your lymph glands are swollen or larger than normal (enlarged). Lymph glands, also called lymph nodes, are collections of tissue that filter bacteria, viruses, and waste from your bloodstream. They are part of your body's disease-fighting system (immune system), which protects your body from germs.  There may be different causes of lymphadenopathy, depending on where it is in your body. Some types go away on their own. Lymphadenopathy can occur anywhere that you have lymph glands, including these areas:  · Neck (cervical lymphadenopathy).  · Chest (mediastinal lymphadenopathy).  · Lungs (hilar lymphadenopathy).  · Underarms (axillary lymphadenopathy).  · Groin (inguinal lymphadenopathy).  When your immune system responds to germs, infection-fighting cells and fluid build up in your lymph glands. This causes some swelling and enlargement. If the lymph glands do not go back to normal after you have an infection or disease, your health care provider may do tests. These tests help to monitor your condition and find the reason why the glands are still swollen and enlarged.  Follow these instructions at home:  · Get plenty of rest.  · Take over-the-counter and prescription medicines only as told by your health care provider. Your health care provider may recommend over-the-counter medicines for pain.  · If directed, apply heat to swollen lymph glands as often as told by your health care provider. Use the heat source that your health care provider recommends, such as a moist heat pack or a heating pad.  ? Place a towel between your skin and the heat source.  ? Leave the heat on for 20-30 minutes.  ? Remove the heat if your skin turns bright red. This is especially important if you are unable to feel pain, heat, or cold. You may have a greater risk of getting burned.  · Check your affected lymph glands every day for changes. Check other lymph gland areas as told by your health  care provider. Check for changes such as:  ? More swelling.  ? Sudden increase in size.  ? Redness or pain.  ? Hardness.  · Keep all follow-up visits as told by your health care provider. This is important.  Contact a health care provider if you have:  · Swelling that gets worse or spreads to other areas.  · Problems with breathing.  · Lymph glands that:  ? Are still swollen after 2 weeks.  ? Have suddenly gotten bigger.  ? Are red, painful, or hard.  · A fever or chills.  · Fatigue.  · A sore throat.  · Pain in your abdomen.  · Weight loss.  · Night sweats.  Get help right away if you have:  · Fluid leaking from an enlarged lymph gland.  · Severe pain.  · Chest pain.  · Shortness of breath.  Summary  · Lymphadenopathy means that your lymph glands are swollen or larger than normal (enlarged).  · Lymph glands (also called lymph nodes) are collections of tissue that filter bacteria, viruses, and waste from the bloodstream. They are part of your body's disease-fighting system (immune system).  · Lymphadenopathy can occur anywhere that you have lymph glands.  · If your enlarged and swollen lymph glands do not go back to normal after you have an infection or disease, your health care provider may do tests to monitor your condition and find the reason why the glands are still swollen and enlarged.  · Check your affected lymph glands every day for changes. Check other lymph   gland areas as told by your health care provider.  This information is not intended to replace advice given to you by your health care provider. Make sure you discuss any questions you have with your health care provider.  Document Released: 07/16/2008 Document Revised: 08/22/2017 Document Reviewed: 08/22/2017  Elsevier Interactive Patient Education © 2019 [employer]

## 2019-01-18 NOTE — Progress Notes (Signed)
Patient: Kimberly Mercer Female    DOB: 1946/07/01   73 y.o.   MRN: [id number] Visit Date: 01/18/2019  Today's Provider: Mar Daring, PA-C   Chief Complaint  Patient presents with  . Cyst   Subjective:     HPI  Patient here today with c/o painful lump on right side of neck. She notice it yesterday. Reports that it feels sore and tender. Reports that it she can feel it when she talks. Reports no problem swallowing.   Allergies  Allergen Reactions  . Claritin [Loratadine]     Panic attacks  . Fructose Diarrhea  . Gluten Meal Other (See Comments)    GI UPSET - CRAMPING  . Lactase Other (See Comments)    Pain, bloating abdominal pain  . Levaquin [Levofloxacin] Nausea Only    Yeast infection  . Sulfa Drugs Cross Reactors Hives     Current Outpatient Medications:  .  acetaminophen (TYLENOL 8 HOUR) 650 MG CR tablet, Take 1,300 mg by mouth as needed for pain., Disp: , Rfl:  .  albuterol (PROVENTIL HFA;VENTOLIN HFA) 108 (90 Base) MCG/ACT inhaler, TAKE 2 PUFFS BY MOUTH EVERY 6 HOURS AS NEEDED FOR WHEEZE OR SHORTNESS OF BREATH, Disp: 18 Inhaler, Rfl: 1 .  aspirin 81 MG tablet, Take 81 mg by mouth daily., Disp: , Rfl:  .  CALCIUM-MAGNESUIUM-ZINC 333-133-8.3 MG TABS, Take 3 tablets by mouth daily., Disp: , Rfl:  .  CANNABIDIOL PO, Take 30 mg by mouth daily., Disp: , Rfl:  .  Cholecalciferol (VITAMIN D3) 2000 units TABS, Take 2,000 Units by mouth daily. , Disp: , Rfl:  .  diclofenac sodium (VOLTAREN) 1 % GEL, Apply 2 g topically 4 (four) times daily as needed., Disp: , Rfl:  .  Famotidine (PEPCID PO), Take by mouth., Disp: , Rfl:  .  fluticasone (FLONASE) 50 MCG/ACT nasal spray, Place 2 sprays into the nose daily. (Patient taking differently: Place 2 sprays into the nose daily as needed for allergies. ), Disp: 16 g, Rfl: 2 .  Fluticasone-Salmeterol (ADVAIR) 100-50 MCG/DOSE AEPB, Inhale 1 puff into the lungs 2 (two) times daily., Disp: 180 each, Rfl: 1 .  ipratropium  (ATROVENT) 0.02 % nebulizer solution, Take 1.25 mLs (0.25 mg total) by nebulization every 4 (four) hours as needed for wheezing or shortness of breath., Disp: 2.5 mL, Rfl: 11 .  levothyroxine (SYNTHROID, LEVOTHROID) 75 MCG tablet, TAKE 1 TABLET BY MOUTH EVERY DAY ON AN EMPTY STOMACH, Disp: 90 tablet, Rfl: 1 .  montelukast (SINGULAIR) 10 MG tablet, TAKE 1 TABLET BY MOUTH IN THE EVENING, Disp: 90 tablet, Rfl: 3 .  Multiple Vitamin (MULTIVITAMIN) tablet, Take 1 tablet by mouth daily., Disp: , Rfl:  .  mupirocin ointment (BACTROBAN) 2 %, Apply topically 2 (two) times daily., Disp: 15 g, Rfl: 0 .  pantoprazole (PROTONIX) 40 MG tablet, TAKE 1 TABLET BY MOUTH EVERY DAY, Disp: 90 tablet, Rfl: 0 .  vitamin E (VITAMIN E) 400 UNIT capsule, Take 400 Units by mouth 2 (two) times daily., Disp: , Rfl:  .  azithromycin (ZITHROMAX) 500 MG tablet, , Disp: , Rfl:   Review of Systems  Constitutional: Negative for fatigue and fever.  HENT: Positive for congestion and postnasal drip. Negative for ear pain (fullness), rhinorrhea, sinus pressure, sinus pain, sneezing and sore throat.   Respiratory: Negative for cough, chest tightness and shortness of breath.   Cardiovascular: Negative for chest pain.  Neurological: Negative for dizziness, light-headedness and headaches.  Hematological: Positive for adenopathy.    Social History   Tobacco Use  . Smoking status: Never Smoker  . Smokeless tobacco: Never Used  Substance Use Topics  . Alcohol use: No      Objective:   BP 138/80 (BP Location: Left Arm, Patient Position: Sitting, Cuff Size: Normal)   Pulse 72   Temp 97.8 F (36.6 C) (Oral)   Resp 16   Wt 134 lb 9.6 oz (61.1 kg)   BMI 21.08 kg/m  Vitals:   01/18/19 0958  BP: 138/80  Pulse: 72  Resp: 16  Temp: 97.8 F (36.6 C)  TempSrc: Oral  Weight: 134 lb 9.6 oz (61.1 kg)     Physical Exam Vitals signs reviewed.  Constitutional:      General: She is not in acute distress.    Appearance: Normal  appearance. She is well-developed and normal weight. She is not ill-appearing or diaphoretic.  HENT:     Head: Normocephalic and atraumatic.     Right Ear: Hearing, tympanic membrane, ear canal and external ear normal.     Left Ear: Hearing, ear canal and external ear normal. A middle ear effusion is present.     Nose: Congestion and rhinorrhea present.     Right Sinus: Maxillary sinus tenderness present. No frontal sinus tenderness.     Left Sinus: Maxillary sinus tenderness present. No frontal sinus tenderness.     Mouth/Throat:     Mouth: Mucous membranes are moist.     Pharynx: Uvula midline. No oropharyngeal exudate or posterior oropharyngeal erythema.  Neck:     Musculoskeletal: Normal range of motion and neck supple.     Thyroid: No thyromegaly.     Trachea: No tracheal deviation.  Cardiovascular:     Rate and Rhythm: Normal rate and regular rhythm.     Heart sounds: Normal heart sounds. No murmur. No friction rub. No gallop.   Pulmonary:     Effort: Pulmonary effort is normal. No respiratory distress.     Breath sounds: Normal breath sounds. No stridor. No wheezing or rales.  Lymphadenopathy:     Cervical: Cervical adenopathy present.     Right cervical: Superficial cervical adenopathy present.  Neurological:     Mental Status: She is alert.         Assessment & Plan    1. Lymphadenopathy, anterior cervical One enlarged lymph node noted in the right anterior cervical chain. Slight tenderness to palpation. Some mild sinus tenderness noted. May be starting to have a sinus infection. Patient reports having an extra zpak at home from when she had a severe URI in January/February. May use tylenol for pain. Epsom salt soaks prn. If symptoms do not improve or worsen she can start the zpak. Call if symptoms worsen or fail to improve.      Mar Daring, PA-C  [facility]

## 2019-02-09 ENCOUNTER — Ambulatory Visit: Payer: Self-pay | Admitting: Family Medicine

## 2019-02-12 DIAGNOSIS — T1502XA Foreign body in cornea, left eye, initial encounter: Secondary | ICD-10-CM | POA: Diagnosis not present

## 2019-02-15 ENCOUNTER — Telehealth: Payer: Self-pay | Admitting: Physician Assistant

## 2019-02-15 DIAGNOSIS — T1502XA Foreign body in cornea, left eye, initial encounter: Secondary | ICD-10-CM | POA: Diagnosis not present

## 2019-02-15 NOTE — Telephone Encounter (Signed)
In Dec of 2019 pt belives she had the Coronavirus.  She wants to have an  antibody test / Coronavirus test done to see if it was the Coronavirus she had then.  Please advise.  Thanks, [employer]

## 2019-02-15 NOTE — Telephone Encounter (Signed)
This is not widely available and is still in trials of accuracy for antibody testing. Once truly available for general public we can notify them and see if they would like testing.

## 2019-02-15 NOTE — Telephone Encounter (Signed)
Patient was advised.  

## 2019-02-15 NOTE — Telephone Encounter (Signed)
Patient husband answered the phone and was advised that we do not test for the COVID-19 virus in our office. But they wanted to know if patient could have a test done to prove she had the antibodies. Please advise.

## 2019-02-16 ENCOUNTER — Ambulatory Visit: Payer: Medicare Other | Admitting: Gastroenterology

## 2019-03-08 ENCOUNTER — Telehealth: Payer: Self-pay

## 2019-03-08 NOTE — Telephone Encounter (Signed)
Is she sick now? If having fever, cough, shortness of breath, progressive fatigue, etc., may need a COVID-19 test. An antibody test may be positive for a past coronavirus infection (and there have been different types of this virus) but will no be helpful if sick now.

## 2019-03-08 NOTE — Telephone Encounter (Signed)
Patient calling that she wants to be tested for the coronavirus antibody. She reports that she checked with medicare and that she it going to be cover. Patient was advised that Tawanna Sat is not in the office this week and requested for this message to be send to [employer] because he saw her when she was really sick and she feels that she had the virus. CB# [phone]

## 2019-03-09 ENCOUNTER — Other Ambulatory Visit: Payer: Self-pay | Admitting: Physician Assistant

## 2019-03-09 NOTE — Telephone Encounter (Signed)
Patient is not currently sick.  Had been sick in December and just wanted to know if she had COVID.

## 2019-03-29 ENCOUNTER — Other Ambulatory Visit: Payer: Self-pay | Admitting: Gastroenterology

## 2019-03-29 ENCOUNTER — Ambulatory Visit: Payer: Medicare Other | Admitting: Family Medicine

## 2019-03-30 ENCOUNTER — Ambulatory Visit (INDEPENDENT_AMBULATORY_CARE_PROVIDER_SITE_OTHER): Payer: Medicare Other | Admitting: Family Medicine

## 2019-03-30 ENCOUNTER — Telehealth: Payer: Self-pay

## 2019-03-30 ENCOUNTER — Encounter: Payer: Self-pay | Admitting: Family Medicine

## 2019-03-30 ENCOUNTER — Telehealth: Payer: Self-pay | Admitting: Physician Assistant

## 2019-03-30 ENCOUNTER — Other Ambulatory Visit: Payer: Self-pay

## 2019-03-30 ENCOUNTER — Emergency Department
Admission: EM | Admit: 2019-03-30 | Discharge: 2019-03-30 | Discharge status: Absent without leave | Payer: Medicare Other

## 2019-03-30 VITALS — BP 118/60 | HR 78 | Temp 97.7°F | Wt 133.0 lb

## 2019-03-30 DIAGNOSIS — R079 Chest pain, unspecified: Secondary | ICD-10-CM

## 2019-03-30 DIAGNOSIS — Z20822 Contact with and (suspected) exposure to covid-19: Secondary | ICD-10-CM

## 2019-03-30 DIAGNOSIS — R0602 Shortness of breath: Secondary | ICD-10-CM

## 2019-03-30 DIAGNOSIS — R6889 Other general symptoms and signs: Secondary | ICD-10-CM | POA: Diagnosis not present

## 2019-03-30 DIAGNOSIS — J45909 Unspecified asthma, uncomplicated: Secondary | ICD-10-CM

## 2019-03-30 MED ORDER — PREDNISONE 10 MG PO TABS: ORAL_TABLET | ORAL | 0 refills | Status: DC

## 2019-03-30 NOTE — Telephone Encounter (Signed)
Pt called saying she is not going to take the covid test.  She said that it would be several hrs before they could get to her,  She said she will increase the medication and take prednisone when she thinks she needs it  CB# [phone]  Con Memos

## 2019-03-30 NOTE — Telephone Encounter (Signed)
Pt has been scheduled for covid-19 testing.  °

## 2019-03-30 NOTE — ED Notes (Signed)
Pt states she was here only for a Covid test

## 2019-03-30 NOTE — Telephone Encounter (Signed)
Patient was seen in the office with multiple symptoms suspicious of COVID 19.  She does have history of asthma and COPD.  She was having SOB and chest pain.  After evaluation here it was decided to send her to the ER for further testing.  We felt she needed to be COVID tested, have EKG and probable nebulizing treatment.  Since are currently not to be doing nebulizing treatments in the office we felt it best for her to have all tests done through the ER. She was sen to the ER however, she did not stay because she thought it was going to take too long.  She left word with out office she would just take her Prednisone and increase her home medications.   Since she did not stay and get tested and she was non compliant with good masking practices, she had several suspicious symptoms and she is currently caring for her husband who is s/p cardiac surgery from 03/26/19, the provider would like her to be tested.  Thank you for your help. If you have any questions feel free to call us at [phone]

## 2019-03-30 NOTE — Progress Notes (Signed)
Kimberly Mercer  MRN: [id number] DOB: 06/17/1946  Subjective:  HPI   The patient is a 73 year old female who presents today with the following symptoms:  SOB, cough, sore throat, headache, dizziness, eye pain, sinus pain, congestion, and chest pain.  She states she has history of COPD and Asthma.  Her symptoms have gotten much worse in the last day or two.   The patient has is masked but insists she can not breath with the mask and continually takes the mask off even when asked to keep it on.  The patient reports she had a tooth extraction that had gotten infected for which she took Amoxicillin.  She states she thinks she probably had a sinus infection when she had the procedure done.   Patient Active Problem List   Diagnosis Date Noted  . Syncope 04/30/2018  . Chronic midline low back pain 03/31/2018  . Primary osteoarthritis of both first carpometacarpal joints 12/18/2015  . Thyroid disease 01/03/2014  . Tachycardia 01/03/2014  . Palpitation 01/03/2014  . Menopause 07/06/2012  . Asthma 07/06/2012  . GERD (gastroesophageal reflux disease) 07/06/2012  . Allergic rhinitis 07/06/2012  . Varicose veins 07/06/2012    Past Medical History:  Diagnosis Date  . Allergic rhinitis, cause unspecified   . Allergy   . Asthma   . Benign neoplasm of colon   . Cancer ([place])   . Candidiasis of the esophagus   . Chicken pox   . Diverticulitis of colon (without mention of hemorrhage)(562.11)   . E coli enteritis   . GERD (gastroesophageal reflux disease)   . Hemorrhoids    internal  . Measles   . Osteoarthrosis, unspecified whether generalized or localized, unspecified site   . Symptomatic menopausal or female climacteric states   . Unspecified asthma(493.90)   . Unspecified essential hypertension   . Unspecified hypothyroidism     Social History   Socioeconomic History  . Marital status: Married    Spouse name: Not on file  . Number of children: 2  . Years of education: masters   . Highest education level: Not on file  Occupational History  . Occupation: Retired    Comment: Research officer, political party Needs  . Financial resource strain: Not on file  . Food insecurity:    Worry: Not on file    Inability: Not on file  . Transportation needs:    Medical: Not on file    Non-medical: Not on file  Tobacco Use  . Smoking status: Never Smoker  . Smokeless tobacco: Never Used  Substance and Sexual Activity  . Alcohol use: No  . Drug use: No  . Sexual activity: Not on file  Lifestyle  . Physical activity:    Days per week: Not on file    Minutes per session: Not on file  . Stress: Not on file  Relationships  . Social connections:    Talks on phone: Not on file    Gets together: Not on file    Attends religious service: Not on file    Active member of club or organization: Not on file    Attends meetings of clubs or organizations: Not on file    Relationship status: Not on file  . Intimate partner violence:    Fear of current or ex partner: Not on file    Emotionally abused: Not on file    Physically abused: Not on file    Forced sexual activity: Not on file  Other Topics Concern  .  Not on file  Social History Narrative   Always uses seat belts. Smoke alarm in the home. No caffeine use. 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 Married x 15 years;3rd marriage. Happily married. Husband with bladder cancer and possible gastric cancer 2012-2013. Has 2 children, 2 grandchildren.    Outpatient Encounter Medications as of 03/30/2019  Medication Sig  . acetaminophen (TYLENOL 8 HOUR) 650 MG CR tablet Take 1,300 mg by mouth as needed for pain.  Marland Kitchen albuterol (PROVENTIL HFA;VENTOLIN HFA) 108 (90 Base) MCG/ACT inhaler TAKE 2 PUFFS BY MOUTH EVERY 6 HOURS AS NEEDED FOR WHEEZE OR SHORTNESS OF BREATH  . aspirin 81 MG tablet Take 81 mg by mouth daily.  Marland Kitchen azithromycin (ZITHROMAX) 500 MG tablet   . CALCIUM-MAGNESUIUM-ZINC  333-133-8.3 MG TABS Take 3 tablets by mouth daily.  Marland Kitchen CANNABIDIOL PO Take 30 mg by mouth daily.  . Cholecalciferol (VITAMIN D3) 2000 units TABS Take 2,000 Units by mouth daily.   . diclofenac sodium (VOLTAREN) 1 % GEL Apply 2 g topically 4 (four) times daily as needed.  . Famotidine (PEPCID PO) Take by mouth.  . fluticasone (FLONASE) 50 MCG/ACT nasal spray Place 2 sprays into the nose daily. (Patient taking differently: Place 2 sprays into the nose daily as needed for allergies. )  . Fluticasone-Salmeterol (ADVAIR) 100-50 MCG/DOSE AEPB Inhale 1 puff into the lungs 2 (two) times daily.  Marland Kitchen ipratropium (ATROVENT) 0.02 % nebulizer solution Take 1.25 mLs (0.25 mg total) by nebulization every 4 (four) hours as needed for wheezing or shortness of breath.  . levothyroxine (SYNTHROID, LEVOTHROID) 75 MCG tablet TAKE 1 TABLET BY MOUTH EVERY DAY ON AN EMPTY STOMACH  . montelukast (SINGULAIR) 10 MG tablet TAKE 1 TABLET BY MOUTH IN THE EVENING  . Multiple Vitamin (MULTIVITAMIN) tablet Take 1 tablet by mouth daily.  . mupirocin ointment (BACTROBAN) 2 % Apply topically 2 (two) times daily.  . pantoprazole (PROTONIX) 40 MG tablet TAKE 1 TABLET BY MOUTH EVERY DAY  . vitamin E (VITAMIN E) 400 UNIT capsule Take 400 Units by mouth 2 (two) times daily.   No facility-administered encounter medications on file as of 03/30/2019.     Allergies  Allergen Reactions  . Claritin [Loratadine]     Panic attacks  . Fructose Diarrhea  . Gluten Meal Other (See Comments)    GI UPSET - CRAMPING  . Lactase Other (See Comments)    Pain, bloating abdominal pain  . Levaquin [Levofloxacin] Nausea Only    Yeast infection  . Sulfa Drugs Cross Reactors Hives    Review of Systems  Constitutional: Negative for fever.  HENT: Positive for congestion, sinus pain, sore throat and tinnitus (chronic).   Eyes: Positive for pain.  Respiratory: Positive for cough, sputum production, shortness of breath and wheezing.   Cardiovascular:  Positive for chest pain and palpitations.    Objective:  BP 118/60 (BP Location: Right Arm, Patient Position: Sitting, Cuff Size: Normal)   Pulse 78   Temp 97.7 F (36.5 C) (Oral)   Wt 133 lb (60.3 kg)   SpO2 98%   BMI 20.83 kg/m   Physical Exam  Constitutional: She is oriented to person, place, and time and well-developed, well-nourished, and in no distress.  HENT:  Head: Normocephalic.  Right Ear: External ear normal.  Left Ear: External ear normal.  Nose: Nose normal.  Mouth/Throat: Oropharynx is clear and moist.  Eyes: Conjunctivae are normal.  Neck: Neck supple.  Cardiovascular: Normal rate and  regular rhythm.  Pulmonary/Chest: She is in respiratory distress. She has no wheezes. She has no rales. She exhibits tenderness.  Abdominal: Soft. Bowel sounds are normal.  Musculoskeletal: Normal range of motion.  Neurological: She is alert and oriented to person, place, and time.  Skin: No rash noted.  Psychiatric: Mood, affect and judgment normal.    Assessment and Plan :  1. Shortness of breath Worsening shortness of breath over the past 2 days but no fever, GI upset or head congestion. Has a history of asthma and a recent tooth extraction with treatment of infection 03-16-19. Finished the Amoxil on 03-26-19 but has had a loss of taste since that time. Tried using her nebulizer once at home today with Ipratropium without much help. Feels she can't cough much due to shortness of breath and no sputum production. No fever today but having malaise and some chills. Sent to the ER for evaluation and probable COVID-19 testing.  2. Chest pain, unspecified type Pounding chest pain/aching with malaise over the past 24 hours. Some chills with slight sweating with shortness of breath. Normal BP, pulse and pulse oximetry at the present. No fever. Sent to the ER for evaluation and probably COVID-19 testing.  3. Persistent asthma without complication, unspecified asthma severity Worsening  shortness of breath the past couple days with chest pounding. No sputum production and difficulty coughing due to shortness of breath. Normal pulse oximetry. Should increase Ipratropium by nebulizer to 4 times a day at home and start a prednisone taper for 6 days. Warned of increased complication risk with chronic pulmonary restrictive disease. Continue Advair 100-50 mcg/inhalation 1 puff BID and use Albuterol-HFA 2 puffs QID prn rescue only. Report progress in 2-3 days. - predniSONE (DELTASONE) 10 MG tablet; Taper down 1 tablet by mouth daily divided between meals and bedtime (6,5,4,3,2,1)  Dispense: 21 tablet; Refill: 0  4. Suspected Covid-19 Virus Infection With shortness of breath, dizziness, headache, chest pounding pain and general malaise, will send to ER for COVID-19 testing and evaluation. Advised to isolate at home if released with COVID monitoring and restrictions. Very concerned about possible infection and exposing her husband since he just got home from a hospitalization for CAD and having balloon angioplasty, stent and atherectomy 03-26-19.

## 2019-03-30 NOTE — Telephone Encounter (Signed)
Schedule with [facility].

## 2019-03-30 NOTE — Telephone Encounter (Signed)
Please review

## 2019-03-30 NOTE — Telephone Encounter (Signed)
Pt has been scheduled for covid testing.   Scheduled with pt directly.   Pt was referred by: Mar Daring PA

## 2019-03-30 NOTE — Telephone Encounter (Signed)
Covid order entered

## 2019-03-30 NOTE — Addendum Note (Signed)
Addended by: Althea Charon D on: 03/30/2019 03:45 PM   Modules accepted: Orders

## 2019-03-30 NOTE — Telephone Encounter (Signed)
Done

## 2019-03-31 ENCOUNTER — Other Ambulatory Visit: Payer: Medicare Other

## 2019-03-31 DIAGNOSIS — Z20822 Contact with and (suspected) exposure to covid-19: Secondary | ICD-10-CM

## 2019-03-31 DIAGNOSIS — R6889 Other general symptoms and signs: Secondary | ICD-10-CM | POA: Diagnosis not present

## 2019-04-01 ENCOUNTER — Ambulatory Visit: Payer: Self-pay | Admitting: Family Medicine

## 2019-04-01 LAB — NOVEL CORONAVIRUS, NAA: SARS-CoV-2, NAA: NOT DETECTED

## 2019-04-02 ENCOUNTER — Ambulatory Visit: Payer: Medicare Other | Admitting: Gastroenterology

## 2019-05-10 ENCOUNTER — Ambulatory Visit: Payer: Medicare Other | Admitting: Gastroenterology

## 2019-05-10 ENCOUNTER — Telehealth: Payer: Self-pay | Admitting: Gastroenterology

## 2019-05-10 NOTE — Telephone Encounter (Signed)
Pt called to cancel apt for today due to covid 19 she feels unsafe to come into the office she also states she has been doing great and is Gaining weight sh declined Televisit due to not having issues she also states she would like for Dr. Marius Ditch to contunue to send rx Pantoprozole 40 mg to [facility] . She wishes  Dr. Marius Ditch well

## 2019-05-11 ENCOUNTER — Other Ambulatory Visit: Payer: Self-pay | Admitting: Gastroenterology

## 2019-05-11 ENCOUNTER — Other Ambulatory Visit: Payer: Self-pay

## 2019-05-11 MED ORDER — PANTOPRAZOLE SODIUM 40 MG PO TBEC: 40.0000 mg | DELAYED_RELEASE_TABLET | Freq: Every day | ORAL | 0 refills | Status: DC

## 2019-05-11 NOTE — Telephone Encounter (Signed)
Medication has been refilled and sent to pharmacy, pt has been notified and verbalized understanding  

## 2019-05-11 NOTE — Progress Notes (Signed)
Medication has been refilled and sent to pharmacy  

## 2019-06-16 ENCOUNTER — Ambulatory Visit (INDEPENDENT_AMBULATORY_CARE_PROVIDER_SITE_OTHER): Payer: Medicare Other | Admitting: Physician Assistant

## 2019-06-16 ENCOUNTER — Encounter: Payer: Self-pay | Admitting: Physician Assistant

## 2019-06-16 ENCOUNTER — Other Ambulatory Visit: Payer: Self-pay

## 2019-06-16 VITALS — BP 118/72 | HR 69 | Temp 97.1°F | Resp 16 | Wt 135.8 lb

## 2019-06-16 DIAGNOSIS — Z23 Encounter for immunization: Secondary | ICD-10-CM

## 2019-06-16 DIAGNOSIS — Z1239 Encounter for other screening for malignant neoplasm of breast: Secondary | ICD-10-CM

## 2019-06-16 DIAGNOSIS — J439 Emphysema, unspecified: Secondary | ICD-10-CM | POA: Diagnosis not present

## 2019-06-16 DIAGNOSIS — K219 Gastro-esophageal reflux disease without esophagitis: Secondary | ICD-10-CM

## 2019-06-16 MED ORDER — PANTOPRAZOLE SODIUM 40 MG PO TBEC: 40.0000 mg | DELAYED_RELEASE_TABLET | Freq: Every day | ORAL | 3 refills | Status: DC

## 2019-06-16 MED ORDER — FLUTICASONE-SALMETEROL 250-50 MCG/DOSE IN AEPB: 1.0000 | INHALATION_SPRAY | Freq: Two times a day (BID) | RESPIRATORY_TRACT | 1 refills | Status: DC

## 2019-06-16 NOTE — Patient Instructions (Signed)
Advair 200-50 twice daily Breo 100-25 or 200-25  Anticholinergics: Tudorza Incruse Spiriva  Combivent

## 2019-06-16 NOTE — Progress Notes (Signed)
Patient: Kimberly Mercer Female    DOB: [birth year]   73 y.o.   MRN: [id number] Visit Date: 06/16/2019  Today's Provider: Mar Daring, PA-C   Chief Complaint  Patient presents with  . Follow-up   Subjective:     HPI  Follow up for asthma/COPD (emphysema)  The patient was last seen for this 8 months ago. Changes made at last visit include no changes.  She reports excellent compliance with treatment. She feels that condition is Unchanged. Patient is using Advair one puff BID.   She is not having side effects.   She does complain of worsening reflux type symptoms. Has had GI issues since being diagnosed with E.coli infection last year.   Also needs mammogram ordered. She does self breast exams. There is family history of breast cancer in a sister.  ------------------------------------------------------------------------------------   Allergies  Allergen Reactions  . Claritin [Loratadine]     Panic attacks  . Fructose Diarrhea  . Gluten Meal Other (See Comments)    GI UPSET - CRAMPING  . Lactase Other (See Comments)    Pain, bloating abdominal pain  . Levaquin [Levofloxacin] Nausea Only    Yeast infection  . Sulfa Drugs Cross Reactors Hives     Current Outpatient Medications:  .  acetaminophen (TYLENOL 8 HOUR) 650 MG CR tablet, Take 1,300 mg by mouth as needed for pain., Disp: , Rfl:  .  albuterol (PROVENTIL HFA;VENTOLIN HFA) 108 (90 Base) MCG/ACT inhaler, TAKE 2 PUFFS BY MOUTH EVERY 6 HOURS AS NEEDED FOR WHEEZE OR SHORTNESS OF BREATH, Disp: 18 Inhaler, Rfl: 1 .  aspirin 81 MG tablet, Take 81 mg by mouth daily., Disp: , Rfl:  .  CALCIUM-MAGNESUIUM-ZINC 333-133-8.3 MG TABS, Take 3 tablets by mouth daily., Disp: , Rfl:  .  CANNABIDIOL PO, Take 30 mg by mouth daily., Disp: , Rfl:  .  Cholecalciferol (VITAMIN D3) 2000 units TABS, Take 2,000 Units by mouth daily. , Disp: , Rfl:  .  diclofenac sodium (VOLTAREN) 1 % GEL, Apply 2 g topically 4 (four) times daily  as needed., Disp: , Rfl:  .  Famotidine (PEPCID PO), Take by mouth., Disp: , Rfl:  .  fluticasone (FLONASE) 50 MCG/ACT nasal spray, Place 2 sprays into the nose daily. (Patient taking differently: Place 2 sprays into the nose daily as needed for allergies. ), Disp: 16 g, Rfl: 2 .  Fluticasone-Salmeterol (ADVAIR) 100-50 MCG/DOSE AEPB, Inhale 1 puff into the lungs 2 (two) times daily., Disp: 180 each, Rfl: 1 .  ipratropium (ATROVENT) 0.02 % nebulizer solution, Take 1.25 mLs (0.25 mg total) by nebulization every 4 (four) hours as needed for wheezing or shortness of breath., Disp: 2.5 mL, Rfl: 11 .  levothyroxine (SYNTHROID, LEVOTHROID) 75 MCG tablet, TAKE 1 TABLET BY MOUTH EVERY DAY ON AN EMPTY STOMACH, Disp: 90 tablet, Rfl: 1 .  montelukast (SINGULAIR) 10 MG tablet, TAKE 1 TABLET BY MOUTH IN THE EVENING, Disp: 90 tablet, Rfl: 3 .  Multiple Vitamin (MULTIVITAMIN) tablet, Take 1 tablet by mouth daily., Disp: , Rfl:  .  mupirocin ointment (BACTROBAN) 2 %, Apply topically 2 (two) times daily., Disp: 15 g, Rfl: 0 .  pantoprazole (PROTONIX) 40 MG tablet, Take 1 tablet (40 mg total) by mouth daily., Disp: 30 tablet, Rfl: 0 .  vitamin E (VITAMIN E) 400 UNIT capsule, Take 400 Units by mouth 2 (two) times daily., Disp: , Rfl:   Review of Systems  Constitutional: Negative.   HENT: Negative.  Respiratory: Negative.   Cardiovascular: Negative.   Gastrointestinal: Negative.   Genitourinary: Negative.   Musculoskeletal: Negative.   Neurological: Negative.     Social History   Tobacco Use  . Smoking status: Never Smoker  . Smokeless tobacco: Never Used  Substance Use Topics  . Alcohol use: No      Objective:   BP 118/72 (BP Location: Left Arm, Patient Position: Sitting, Cuff Size: Normal)   Pulse 69   Temp (!) 97.1 F (36.2 C) (Temporal)   Resp 16   Wt 135 lb 12.8 oz (61.6 kg)   SpO2 96%   BMI 21.27 kg/m  Vitals:   06/16/19 1332  BP: 118/72  Pulse: 69  Resp: 16  Temp: (!) 97.1 F (36.2  C)  TempSrc: Temporal  SpO2: 96%  Weight: 135 lb 12.8 oz (61.6 kg)     Physical Exam Vitals signs reviewed.  Constitutional:      General: She is not in acute distress.    Appearance: Normal appearance. She is well-developed and normal weight. She is not ill-appearing or diaphoretic.  Neck:     Musculoskeletal: Normal range of motion and neck supple.     Thyroid: No thyromegaly.     Vascular: No JVD.     Trachea: No tracheal deviation.  Cardiovascular:     Rate and Rhythm: Normal rate and regular rhythm.     Heart sounds: Normal heart sounds. No murmur. No friction rub. No gallop.   Pulmonary:     Effort: Pulmonary effort is normal. No respiratory distress.     Breath sounds: Normal breath sounds. No wheezing or rales.  Abdominal:     General: Abdomen is flat. Bowel sounds are normal. There is no distension.     Palpations: Abdomen is soft. There is no mass.     Tenderness: There is no abdominal tenderness.     Hernia: No hernia is present.  Lymphadenopathy:     Cervical: No cervical adenopathy.  Neurological:     Mental Status: She is alert.      No results found for any visits on 06/16/19.     Assessment & Plan    1. Pulmonary emphysema, unspecified emphysema type ([place]) Stable. Diagnosis pulled for medication refill. Continue current medical treatment plan. - Fluticasone-Salmeterol (ADVAIR DISKUS) 250-50 MCG/DOSE AEPB; Inhale 1 puff into the lungs 2 (two) times daily.  Dispense: 60 each; Refill: 1  2. Breast cancer screening There is family history of breast cancer in a sister. She does perform regular self breast exams. Mammogram was ordered as below. Information for [facility] clinic was given to patient so she may schedule her mammogram at her convenience. - MM 3D SCREEN BREAST BILATERAL; Future  3. Gastroesophageal reflux disease without esophagitis Worsening. Will start protonix as below. Call if not improving or f/u with GI.  - pantoprazole (PROTONIX)  40 MG tablet; Take 1 tablet (40 mg total) by mouth daily.  Dispense: 90 tablet; Refill: 3  4. Need for influenza vaccination Flu vaccine given today without complication. Patient sat upright for 15 minutes to check for adverse reaction before being released. - Flu Vaccine QUAD High Dose(Fluad)     Mar Daring, PA-C  [facility]

## 2019-06-24 ENCOUNTER — Encounter: Payer: Self-pay | Admitting: Physician Assistant

## 2019-07-08 ENCOUNTER — Other Ambulatory Visit: Payer: Self-pay | Admitting: Physician Assistant

## 2019-07-08 DIAGNOSIS — J439 Emphysema, unspecified: Secondary | ICD-10-CM

## 2019-07-13 NOTE — Progress Notes (Signed)
Subjective:   Kimberly Mercer is a 73 y.o. female who presents for an Initial Medicare Annual Wellness Visit.    This visit is being conducted through telemedicine due to the COVID-19 pandemic. This patient has given me verbal consent via doximity to conduct this visit, patient states they are participating from their home address. Some vital signs may be absent or patient reported.    Patient identification: identified by name, DOB, and current address  Review of Systems    N/A   Cardiac Risk Factors include: advanced age (>73men, >81 women)     Objective:    Today's Vitals   07/14/19 1419  PainSc: 0-No pain   There is no height or weight on file to calculate BMI. Unable to obtain vitals due to visit being conducted via telephonically.   Advanced Directives 07/14/2019 04/30/2018  Does Patient Have a Medical Advance Directive? No No  Would patient like information on creating a medical advance directive? - No - Patient declined    Current Medications (verified) Outpatient Encounter Medications as of 07/14/2019  Medication Sig  . acetaminophen (TYLENOL 8 HOUR) 650 MG CR tablet Take 1,300 mg by mouth as needed for pain.  Marland Kitchen ADVAIR DISKUS 250-50 MCG/DOSE AEPB TAKE 1 PUFF BY MOUTH TWICE A DAY  . albuterol (PROVENTIL HFA;VENTOLIN HFA) 108 (90 Base) MCG/ACT inhaler TAKE 2 PUFFS BY MOUTH EVERY 6 HOURS AS NEEDED FOR WHEEZE OR SHORTNESS OF BREATH  . aspirin 81 MG tablet Take 81 mg by mouth daily.  Marland Kitchen CALCIUM-MAGNESUIUM-ZINC 333-133-8.3 MG TABS Take 3 tablets by mouth daily.  Marland Kitchen CANNABIDIOL PO Take 30 mg by mouth daily.  . Cholecalciferol (VITAMIN D3) 2000 units TABS Take 2,000 Units by mouth daily.   . diclofenac sodium (VOLTAREN) 1 % GEL Apply 2 g topically 4 (four) times daily as needed.  . Famotidine (PEPCID PO) Take 20 mg by mouth as needed.   . fluticasone (FLONASE) 50 MCG/ACT nasal spray Place 2 sprays into the nose daily. (Patient taking differently: Place 2 sprays into the nose  2 (two) times daily. )  . ipratropium (ATROVENT) 0.02 % nebulizer solution Take 1.25 mLs (0.25 mg total) by nebulization every 4 (four) hours as needed for wheezing or shortness of breath.  . levothyroxine (SYNTHROID, LEVOTHROID) 75 MCG tablet TAKE 1 TABLET BY MOUTH EVERY DAY ON AN EMPTY STOMACH  . montelukast (SINGULAIR) 10 MG tablet TAKE 1 TABLET BY MOUTH IN THE EVENING  . Multiple Vitamin (MULTIVITAMIN) tablet Take 1 tablet by mouth daily.  . mupirocin ointment (BACTROBAN) 2 % Apply topically 2 (two) times daily. (Patient taking differently: Apply topically 2 (two) times daily. As needed)  . pantoprazole (PROTONIX) 40 MG tablet Take 1 tablet (40 mg total) by mouth daily.  . vitamin E (VITAMIN E) 400 UNIT capsule Take 800 Units by mouth daily.    No facility-administered encounter medications on file as of 07/14/2019.     Allergies (verified) Claritin [loratadine], Fructose, Gluten meal, Lactase, Levaquin [levofloxacin], and Sulfa drugs cross reactors   History: Past Medical History:  Diagnosis Date  . Allergic rhinitis, cause unspecified   . Allergy   . Asthma   . Benign neoplasm of colon   . Cancer ([place])   . Candidiasis of the esophagus   . Chicken pox   . Diverticulitis of colon (without mention of hemorrhage)(562.11)   . E coli enteritis   . GERD (gastroesophageal reflux disease)   . Hemorrhoids    internal  . Measles   .  Osteoarthrosis, unspecified whether generalized or localized, unspecified site   . Symptomatic menopausal or female climacteric states   . Unspecified asthma(493.90)   . Unspecified essential hypertension   . Unspecified hypothyroidism    Past Surgical History:  Procedure Laterality Date  . cardiac catherization  12/09/2008   normal coranaries,normal EF.  . dilatation and curettage     due to SAB x 2  . thyroid nodule  1990   benign  . TOTAL ABDOMINAL HYSTERECTOMY W/ BILATERAL SALPINGOOPHORECTOMY  2003   complex endometrial hyperplasia, DUB,  fibroids  . WISDOM TOOTH EXTRACTION     Family History  Problem Relation Age of Onset  . Heart disease Mother        CAD  . Stroke Mother        x 2  . Mental illness Mother        not diagnosed  . Irritable bowel syndrome Mother   . Heart disease Father        CAD  . Parkinsonism Father   . Cancer Sister        Breast  . Cancer Other        Breast and Ovarian  . Heart disease Brother   . Schizophrenia Sister   . Lung disease Sister    Social History   Socioeconomic History  . Marital status: Married    Spouse name: Not on file  . Number of children: 2  . Years of education: masters  . Highest education level: Master's degree (e.g., MA, MS, MEng, MEd, MSW, MBA)  Occupational History  . Occupation: Retired    Comment: Research officer, political party Needs  . Financial resource strain: Not hard at all  . Food insecurity    Worry: Never true    Inability: Never true  . Transportation needs    Medical: No    Non-medical: No  Tobacco Use  . Smoking status: Never Smoker  . Smokeless tobacco: Never Used  Substance and Sexual Activity  . Alcohol use: No  . Drug use: No  . Sexual activity: Not on file  Lifestyle  . Physical activity    Days per week: 0 days    Minutes per session: 0 min  . Stress: Not at all  Relationships  . Social Herbalist on phone: Patient refused    Gets together: Patient refused    Attends religious service: Patient refused    Active member of club or organization: Patient refused    Attends meetings of clubs or organizations: Patient refused    Relationship status: Patient refused  Other Topics Concern  . Not on file  Social History Narrative   Always uses seat belts. Smoke alarm in the home. No caffeine use. 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 Married x 15 years;3rd marriage. Happily married. Husband with bladder cancer and possible gastric cancer 2012-2013. Has 2 children, 2  grandchildren.    Tobacco Counseling Counseling given: Not Answered   Clinical Intake:  Pre-visit preparation completed: Yes  Pain : No/denies pain Pain Score: 0-No pain     Nutritional Risks: None Diabetes: No  How often do you need to have someone help you when you read instructions, pamphlets, or other written materials from your doctor or pharmacy?: 1 - Never  Interpreter Needed?: No  Information entered by :: [facility], LPN   Activities of Daily Living In your present state of health, do you have any difficulty performing the  following activities: 07/14/2019  Hearing? N  Vision? N  Difficulty concentrating or making decisions? N  Walking or climbing stairs? N  Dressing or bathing? N  Doing errands, shopping? N  Preparing Food and eating ? N  Using the Toilet? N  In the past six months, have you accidently leaked urine? N  Do you have problems with loss of bowel control? N  Managing your Medications? N  Managing your Finances? N  Housekeeping or managing your Housekeeping? N  Some recent data might be hidden     Immunizations and Health Maintenance Immunization History  Administered Date(s) Administered  . Fluad Quad(high Dose 65+) 06/16/2019  . Influenza Split 07/06/2012  . Influenza, High Dose Seasonal PF 08/22/2017, 07/01/2018  . Influenza-Unspecified 06/22/2011, 08/19/2013, 09/20/2014, 08/07/2015, 08/27/2016, 08/22/2017  . Pneumococcal Polysaccharide-23 05/01/2012  . Pneumococcal-Unspecified 07/26/2009  . Td 10/21/2001  . Tdap 12/24/2010  . Zoster 05/01/2013   Health Maintenance Due  Topic Date Due  . Hepatitis C Screening  [birth year]  . PNA vac Low Risk Adult (1 of 2 - PCV13) 05/01/2013  . MAMMOGRAM  12/25/2013    Patient Care Team: Mar Daring, PA-C as PCP - General (Family Medicine) Yolonda Kida, MD as Consulting Physician (Cardiology) Lin Landsman, MD as Consulting Physician (Gastroenterology)  Indicate any recent  Medical Services you may have received from other than [facility] providers in the past year (date may be approximate).     Assessment:   This is a routine wellness examination for [place].  Hearing/Vision screen No exam data present  Dietary issues and exercise activities discussed: Current Exercise Habits: The patient has a physically strenuous job, but has no regular exercise apart from work., Exercise limited by: None identified  Goals   None    Depression Screen PHQ 2/9 Scores 07/14/2019 06/16/2019 02/19/2018  PHQ - 2 Score 0 0 0  PHQ- 9 Score - - 0    Fall Risk Fall Risk  07/14/2019 02/19/2018  Falls in the past year? 1 No  Number falls in past yr: 1 -  Injury with Fall? 0 -  Follow up Falls prevention discussed -   FALL RISK PREVENTION PERTAINING TO THE HOME:  Any stairs in or around the home? Yes  If so, are there any without handrails? No   Home free of loose throw rugs in walkways, pet beds, electrical cords, etc? Yes  Adequate lighting in your home to reduce risk of falls? Yes   ASSISTIVE DEVICES UTILIZED TO PREVENT FALLS:  Life alert? No  Use of a cane, walker or w/c? No  Grab bars in the bathroom? Yes  Shower chair or bench in shower? No  Elevated toilet seat or a handicapped toilet? No    TIMED UP AND GO:  Was the test performed? No .     Cognitive Function: Declined today.         Screening Tests Health Maintenance  Topic Date Due  . Hepatitis C Screening  1946/04/19  . PNA vac Low Risk Adult (1 of 2 - PCV13) 05/01/2013  . MAMMOGRAM  12/25/2013  . DEXA SCAN  07/13/2020 (Originally 03/26/2013)  . COLONOSCOPY  10/13/2019  . TETANUS/TDAP  12/23/2020  . INFLUENZA VACCINE  Completed    Qualifies for Shingles Vaccine? Yes  Zostavax completed 05/01/13. Due for Shingrix. Education has been provided regarding the importance of this vaccine. Pt has been advised to call insurance company to determine out of pocket expense. Advised may also receive vaccine at  local pharmacy or Health Dept. Verbalized acceptance and understanding.  Tdap: Up to date  Flu Vaccine: Up to date  Pneumococcal Vaccine: Due for Pneumococcal vaccine. Does the patient want to receive this vaccine today?  No .   Cancer Screenings:  Colorectal Screening: Completed 10/12/09. Repeat every 10 years; No longer required.   Mammogram: Completed 01/18/13. Ordered 06/16/19. Pt to set up apt this year.   Bone Density: Currently due. Declined completing this screening.   Lung Cancer Screening: (Low Dose CT Chest recommended if Age 75-80 years, 30 pack-year currently smoking OR have quit w/in 15years.) does not qualify.   Additional Screening:  Hepatitis C Screening: does qualify; however declines order today.   Vision Screening: Recommended annual ophthalmology exams for early detection of glaucoma and other disorders of the eye.  Dental Screening: Recommended annual dental exams for proper oral hygiene  Community Resource Referral:  CRR required this visit?  No       Plan:  I have personally reviewed and addressed the Medicare Annual Wellness questionnaire and have noted the following in the patient's chart:  A. Medical and social history B. Use of alcohol, tobacco or illicit drugs  C. Current medications and supplements D. Functional ability and status E.  Nutritional status F.  Physical activity G. Advance directives H. List of other physicians I.  Hospitalizations, surgeries, and ER visits in previous 12 months J.  [place] such as hearing and vision if needed, cognitive and depression L. Referrals and appointments   In addition, I have reviewed and discussed with patient certain preventive protocols, quality metrics, and best practice recommendations. A written personalized care plan for preventive services as well as general preventive health recommendations were provided to patient.   Glendora Score, [place]   [id number]  Nurse  Health Advisor   Nurse Notes: Pt to receive a Hep C lab order and Prevnar 13 vaccine at next in office visit. Pt to schedule a mammogram this year (order already on file).

## 2019-07-14 ENCOUNTER — Ambulatory Visit (INDEPENDENT_AMBULATORY_CARE_PROVIDER_SITE_OTHER): Payer: Medicare Other

## 2019-07-14 ENCOUNTER — Other Ambulatory Visit: Payer: Self-pay

## 2019-07-14 DIAGNOSIS — Z0000 Encounter for general adult medical examination without abnormal findings: Secondary | ICD-10-CM | POA: Diagnosis not present

## 2019-07-14 NOTE — Patient Instructions (Signed)
Kimberly Mercer , Thank you for taking time to come for your Medicare Wellness Visit. I appreciate your ongoing commitment to your health goals. Please review the following plan we discussed and let me know if I can assist you in the future.   Screening recommendations/referrals: Colonoscopy: Up to date, due 09/2019 Mammogram: Order on file. Pt to set up soon. Bone Density: Declined an order to have this completed.  Recommended yearly ophthalmology/optometry visit for glaucoma screening and checkup Recommended yearly dental visit for hygiene and checkup  Vaccinations: Influenza vaccine: Up to date Pneumococcal vaccine: Prevnar 13 due. Pt to receive at next in office visit.  Tdap vaccine: Up to date, due 12/2020 Shingles vaccine: Pt declines today.     Advanced directives: Please bring a copy of your POA (Power of Attorney) and/or Living Will to your next appointment.   Conditions/risks identified: None.   Next appointment: 07/22/19 @ 8:00 AM with Fenton Malling.    Preventive Care 71 Years and Older, Female Preventive care refers to lifestyle choices and visits with your health care provider that can promote health and wellness. What does preventive care include?  A yearly physical exam. This is also called an annual well check.  Dental exams once or twice a year.  Routine eye exams. Ask your health care provider how often you should have your eyes checked.  Personal lifestyle choices, including:  Daily care of your teeth and gums.  Regular physical activity.  Eating a healthy diet.  Avoiding tobacco and drug use.  Limiting alcohol use.  Practicing safe sex.  Taking low-dose aspirin every day.  Taking vitamin and mineral supplements as recommended by your health care provider. What happens during an annual well check? The services and screenings done by your health care provider during your annual well check will depend on your age, overall health, lifestyle risk factors,  and family history of disease. Counseling  Your health care provider may ask you questions about your:  Alcohol use.  Tobacco use.  Drug use.  Emotional well-being.  Home and relationship well-being.  Sexual activity.  Eating habits.  History of falls.  Memory and ability to understand (cognition).  Work and work Statistician.  Reproductive health. Screening  You may have the following tests or measurements:  Height, weight, and BMI.  Blood pressure.  Lipid and cholesterol levels. These may be checked every 5 years, or more frequently if you are over 34 years old.  Skin check.  Lung cancer screening. You may have this screening every year starting at age 17 if you have a 30-pack-year history of smoking and currently smoke or have quit within the past 15 years.  Fecal occult blood test (FOBT) of the stool. You may have this test every year starting at age 65.  Flexible sigmoidoscopy or colonoscopy. You may have a sigmoidoscopy every 5 years or a colonoscopy every 10 years starting at age 70.  Hepatitis C blood test.  Hepatitis B blood test.  Sexually transmitted disease (STD) testing.  Diabetes screening. This is done by checking your blood sugar (glucose) after you have not eaten for a while (fasting). You may have this done every 1-3 years.  Bone density scan. This is done to screen for osteoporosis. You may have this done starting at age 53.  Mammogram. This may be done every 1-2 years. Talk to your health care provider about how often you should have regular mammograms. Talk with your health care provider about your test results, treatment options,  and if necessary, the need for more tests. Vaccines  Your health care provider may recommend certain vaccines, such as:  Influenza vaccine. This is recommended every year.  Tetanus, diphtheria, and acellular pertussis (Tdap, Td) vaccine. You may need a Td booster every 10 years.  Zoster vaccine. You may need  this after age 71.  Pneumococcal 13-valent conjugate (PCV13) vaccine. One dose is recommended after age 30.  Pneumococcal polysaccharide (PPSV23) vaccine. One dose is recommended after age 5. Talk to your health care provider about which screenings and vaccines you need and how often you need them. This information is not intended to replace advice given to you by your health care provider. Make sure you discuss any questions you have with your health care provider. Document Released: 11/03/2015 Document Revised: 06/26/2016 Document Reviewed: 08/08/2015 Elsevier Interactive Patient Education  2017 [employer] Prevention in the Home Falls can cause injuries. They can happen to people of all ages. There are many things you can do to make your home safe and to help prevent falls. What can I do on the outside of my home?  Regularly fix the edges of walkways and driveways and fix any cracks.  Remove anything that might make you trip as you walk through a door, such as a raised step or threshold.  Trim any bushes or trees on the path to your home.  Use bright outdoor lighting.  Clear any walking paths of anything that might make someone trip, such as rocks or tools.  Regularly check to see if handrails are loose or broken. Make sure that both sides of any steps have handrails.  Any raised decks and porches should have guardrails on the edges.  Have any leaves, snow, or ice cleared regularly.  Use sand or salt on walking paths during winter.  Clean up any spills in your garage right away. This includes oil or grease spills. What can I do in the bathroom?  Use night lights.  Install grab bars by the toilet and in the tub and shower. Do not use towel bars as grab bars.  Use non-skid mats or decals in the tub or shower.  If you need to sit down in the shower, use a plastic, non-slip stool.  Keep the floor dry. Clean up any water that spills on the floor as soon as it  happens.  Remove soap buildup in the tub or shower regularly.  Attach bath mats securely with double-sided non-slip rug tape.  Do not have throw rugs and other things on the floor that can make you trip. What can I do in the bedroom?  Use night lights.  Make sure that you have a light by your bed that is easy to reach.  Do not use any sheets or blankets that are too big for your bed. They should not hang down onto the floor.  Have a firm chair that has side arms. You can use this for support while you get dressed.  Do not have throw rugs and other things on the floor that can make you trip. What can I do in the kitchen?  Clean up any spills right away.  Avoid walking on wet floors.  Keep items that you use a lot in easy-to-reach places.  If you need to reach something above you, use a strong step stool that has a grab bar.  Keep electrical cords out of the way.  Do not use floor polish or wax that makes floors slippery. If  you must use wax, use non-skid floor wax.  Do not have throw rugs and other things on the floor that can make you trip. What can I do with my stairs?  Do not leave any items on the stairs.  Make sure that there are handrails on both sides of the stairs and use them. Fix handrails that are broken or loose. Make sure that handrails are as long as the stairways.  Check any carpeting to make sure that it is firmly attached to the stairs. Fix any carpet that is loose or worn.  Avoid having throw rugs at the top or bottom of the stairs. If you do have throw rugs, attach them to the floor with carpet tape.  Make sure that you have a light switch at the top of the stairs and the bottom of the stairs. If you do not have them, ask someone to add them for you. What else can I do to help prevent falls?  Wear shoes that:  Do not have high heels.  Have rubber bottoms.  Are comfortable and fit you well.  Are closed at the toe. Do not wear sandals.  If you  use a stepladder:  Make sure that it is fully opened. Do not climb a closed stepladder.  Make sure that both sides of the stepladder are locked into place.  Ask someone to hold it for you, if possible.  Clearly mark and make sure that you can see:  Any grab bars or handrails.  First and last steps.  Where the edge of each step is.  Use tools that help you move around (mobility aids) if they are needed. These include:  Canes.  Walkers.  Scooters.  Crutches.  Turn on the lights when you go into a dark area. Replace any light bulbs as soon as they burn out.  Set up your furniture so you have a clear path. Avoid moving your furniture around.  If any of your floors are uneven, fix them.  If there are any pets around you, be aware of where they are.  Review your medicines with your doctor. Some medicines can make you feel dizzy. This can increase your chance of falling. Ask your doctor what other things that you can do to help prevent falls. This information is not intended to replace advice given to you by your health care provider. Make sure you discuss any questions you have with your health care provider. Document Released: 08/03/2009 Document Revised: 03/14/2016 Document Reviewed: 11/11/2014 Elsevier Interactive Patient Education  2017 [employer].

## 2019-07-20 ENCOUNTER — Ambulatory Visit: Payer: Medicare Other

## 2019-07-21 NOTE — Progress Notes (Signed)
Patient: Kimberly Mercer Female    DOB: [birth year]   73 y.o.   MRN: [id number] Visit Date: 07/27/2019  Today's Provider: Mar Daring, PA-C   Chief Complaint  Patient presents with  . Follow-up    Thyroid   Subjective:    I,Joseline E. Rosas,RMA am acting as a Education administrator for [employer], PA-C.  HPI   Hypothyroid, follow-up:  TSH  Date Value Ref Range Status  07/22/2019 1.050 0.450 - 4.500 uIU/mL Final  07/01/2018 0.863 0.450 - 4.500 uIU/mL Final  04/30/2018 0.884 0.350 - 4.500 uIU/mL Final    Comment:    Performed by a 3rd Generation assay with a functional sensitivity of <=0.01 uIU/mL. Performed at [facility], [place] [address] [place], [place] [address]    Wt Readings from Last 3 Encounters:  07/22/19 137 lb 3.2 oz (62.2 kg)  06/16/19 135 lb 12.8 oz (61.6 kg)  03/30/19 133 lb (60.3 kg)   She is exercising. She is experiencing none She denies change in energy level, diarrhea, heat / cold intolerance, nervousness, palpitations and weight changes  ------------------------------------------------------------------------   Allergies  Allergen Reactions  . Claritin [Loratadine]     Panic attacks  . Fructose Diarrhea  . Gluten Meal Other (See Comments)    GI UPSET - CRAMPING  . Lactase Other (See Comments)    Pain, bloating abdominal pain  . Levaquin [Levofloxacin] Nausea Only    Yeast infection  . Sulfa Drugs Cross Reactors Hives     Current Outpatient Medications:  .  acetaminophen (TYLENOL 8 HOUR) 650 MG CR tablet, Take 1,300 mg by mouth as needed for pain., Disp: , Rfl:  .  albuterol (PROVENTIL HFA;VENTOLIN HFA) 108 (90 Base) MCG/ACT inhaler, TAKE 2 PUFFS BY MOUTH EVERY 6 HOURS AS NEEDED FOR WHEEZE OR SHORTNESS OF BREATH, Disp: 18 Inhaler, Rfl: 1 .  aspirin 81 MG tablet, Take 81 mg by mouth daily., Disp: , Rfl:  .  CALCIUM-MAGNESUIUM-ZINC 333-133-8.3 MG TABS, Take 3 tablets by mouth daily., Disp: , Rfl:  .  CANNABIDIOL PO, Take 30 mg  by mouth daily., Disp: , Rfl:  .  Cholecalciferol (VITAMIN D3) 2000 units TABS, Take 2,000 Units by mouth daily. , Disp: , Rfl:  .  diclofenac sodium (VOLTAREN) 1 % GEL, Apply 2 g topically 4 (four) times daily as needed., Disp: , Rfl:  .  Famotidine (PEPCID PO), Take 20 mg by mouth as needed. , Disp: , Rfl:  .  fluticasone (FLONASE) 50 MCG/ACT nasal spray, Place 2 sprays into the nose daily. (Patient taking differently: Place 2 sprays into the nose 2 (two) times daily. ), Disp: 16 g, Rfl: 2 .  ipratropium (ATROVENT) 0.02 % nebulizer solution, Take 1.25 mLs (0.25 mg total) by nebulization every 4 (four) hours as needed for wheezing or shortness of breath., Disp: 2.5 mL, Rfl: 11 .  montelukast (SINGULAIR) 10 MG tablet, TAKE 1 TABLET BY MOUTH IN THE EVENING, Disp: 90 tablet, Rfl: 3 .  Multiple Vitamin (MULTIVITAMIN) tablet, Take 1 tablet by mouth daily., Disp: , Rfl:  .  mupirocin ointment (BACTROBAN) 2 %, Apply topically 2 (two) times daily. (Patient taking differently: Apply topically 2 (two) times daily. As needed), Disp: 15 g, Rfl: 0 .  vitamin E (VITAMIN E) 400 UNIT capsule, Take 800 Units by mouth daily. , Disp: , Rfl:  .  fluticasone furoate-vilanterol (BREO ELLIPTA) 200-25 MCG/INH AEPB, Inhale 1 puff into the lungs daily., Disp: 60 each,  Rfl: 11 .  levothyroxine (SYNTHROID) 75 MCG tablet, TAKE 1 TABLET BY MOUTH EVERY DAY ON AN EMPTY STOMACH, Disp: 90 tablet, Rfl: 1 .  pantoprazole (PROTONIX) 40 MG tablet, Take 1 tablet (40 mg total) by mouth daily., Disp: 90 tablet, Rfl: 3  Review of Systems  Constitutional: Negative.   Respiratory: Negative.   Cardiovascular: Negative.   Gastrointestinal: Negative.   Neurological: Negative.     Social History   Tobacco Use  . Smoking status: Never Smoker  . Smokeless tobacco: Never Used  Substance Use Topics  . Alcohol use: No      Objective:   BP 113/73 (BP Location: Left Arm, Patient Position: Sitting, Cuff Size: Normal)   Pulse 67   Temp  98.5 F (36.9 C) (Other (Comment)) Comment (Src): forehead  Resp 16   Wt 137 lb 3.2 oz (62.2 kg)   BMI 21.49 kg/m  Vitals:   07/22/19 0806  BP: 113/73  Pulse: 67  Resp: 16  Temp: 98.5 F (36.9 C)  TempSrc: Other (Comment)  Weight: 137 lb 3.2 oz (62.2 kg)  Body mass index is 21.49 kg/m.   Physical Exam Vitals signs reviewed.  Constitutional:      General: She is not in acute distress.    Appearance: She is well-developed. She is not diaphoretic.  HENT:     Head: Normocephalic and atraumatic.     Right Ear: Hearing, tympanic membrane, ear canal and external ear normal.     Left Ear: Hearing, tympanic membrane, ear canal and external ear normal.     Nose: Nose normal.     Mouth/Throat:     Mouth: Mucous membranes are moist.     Pharynx: Uvula midline. No oropharyngeal exudate or posterior oropharyngeal erythema.  Eyes:     General: No scleral icterus.       Right eye: No discharge.        Left eye: No discharge.     Extraocular Movements: Extraocular movements intact.     Conjunctiva/sclera: Conjunctivae normal.     Pupils: Pupils are equal, round, and reactive to light.  Neck:     Musculoskeletal: Normal range of motion and neck supple.     Thyroid: No thyromegaly.     Trachea: No tracheal deviation.  Cardiovascular:     Rate and Rhythm: Normal rate and regular rhythm.     Heart sounds: Normal heart sounds. No murmur. No friction rub. No gallop.   Pulmonary:     Effort: Pulmonary effort is normal. No respiratory distress.     Breath sounds: Normal breath sounds. No stridor. No wheezing or rales.  Abdominal:     General: Abdomen is flat. Bowel sounds are normal.     Palpations: Abdomen is soft.     Tenderness: There is no abdominal tenderness.  Lymphadenopathy:     Cervical: No cervical adenopathy.  Skin:    General: Skin is warm and dry.      Results for orders placed or performed in visit on 07/22/19  CBC w/Diff/Platelet  Result Value Ref Range   WBC 3.7  3.4 - 10.8 x10E3/uL   RBC 4.85 3.77 - 5.28 x10E6/uL   Hemoglobin 15.4 11.1 - 15.9 g/dL   Hematocrit 45.6 34.0 - 46.6 %   MCV 94 79 - 97 fL   MCH 31.8 26.6 - 33.0 pg   MCHC 33.8 31.5 - 35.7 g/dL   RDW 11.8 11.7 - 15.4 %   Platelets 277 150 - 450 x10E3/uL  Neutrophils 57 Not Estab. %   Lymphs 30 Not Estab. %   Monocytes 10 Not Estab. %   Eos 2 Not Estab. %   Basos 1 Not Estab. %   Neutrophils Absolute 2.1 1.4 - 7.0 x10E3/uL   Lymphocytes Absolute 1.1 0.7 - 3.1 x10E3/uL   Monocytes Absolute 0.4 0.1 - 0.9 x10E3/uL   EOS (ABSOLUTE) 0.1 0.0 - 0.4 x10E3/uL   Basophils Absolute 0.1 0.0 - 0.2 x10E3/uL   Immature Granulocytes 0 Not Estab. %   Immature Grans (Abs) 0.0 0.0 - 0.1 x10E3/uL  Comprehensive Metabolic Panel (CMET)  Result Value Ref Range   Glucose 96 65 - 99 mg/dL   BUN 26 8 - 27 mg/dL   Creatinine, Ser 0.94 0.57 - 1.00 mg/dL   GFR calc non Af Amer 61 >59 mL/min/1.73   GFR calc Af Amer 71 >59 mL/min/1.73   BUN/Creatinine Ratio 28 12 - 28   Sodium 139 134 - 144 mmol/L   Potassium 4.4 3.5 - 5.2 mmol/L   Chloride 101 96 - 106 mmol/L   CO2 24 20 - 29 mmol/L   Calcium 9.7 8.7 - 10.3 mg/dL   Total Protein 6.9 6.0 - 8.5 g/dL   Albumin 4.9 (H) 3.7 - 4.7 g/dL   Globulin, Total 2.0 1.5 - 4.5 g/dL   Albumin/Globulin Ratio 2.5 (H) 1.2 - 2.2   Bilirubin Total 0.3 0.0 - 1.2 mg/dL   Alkaline Phosphatase 78 39 - 117 IU/L   AST 28 0 - 40 IU/L   ALT 23 0 - 32 IU/L  TSH  Result Value Ref Range   TSH 1.050 0.450 - 4.500 uIU/mL  Hepatitis C Antibody  Result Value Ref Range   Hep C Virus Ab <0.1 0.0 - 0.9 s/co ratio  Lipid Profile  Result Value Ref Range   Cholesterol, Total 244 (H) 100 - 199 mg/dL   Triglycerides 223 (H) 0 - 149 mg/dL   HDL 95 >39 mg/dL   VLDL Cholesterol Cal 37 5 - 40 mg/dL   LDL Chol Calc (NIH) 112 (H) 0 - 99 mg/dL   Chol/HDL Ratio 2.6 0.0 - 4.4 ratio       Assessment & Plan    1. Thyroid disease Stable on Levothyroxinxe 95mcg. Will check labs as below and  f/u pending results. - CBC w/Diff/Platelet - Comprehensive Metabolic Panel (CMET) - TSH  2. 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 Still not to goal with Advair 250-50. Will change to Breo 200-25mcg. She is to call if this is more effective and will send in refills.   3. 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 Will check labs as below and f/u pending results. - CBC w/Diff/Platelet - Comprehensive Metabolic Panel (CMET)  4. Encounter for lipid screening for cardiovascular disease Will check labs as below and f/u pending results. - Lipid Profile  5. Need for hepatitis C screening test Will check labs as below and f/u pending results. - Hepatitis C Antibody  6. Need for pneumococcal vaccination Prevnar 13 Vaccine given to patient without complications. Patient sat for 15 minutes after administration and was tolerated well without adverse effects. - Pneumococcal conjugate vaccine 13-valent IM     Mar Daring, PA-C  [facility]

## 2019-07-22 ENCOUNTER — Ambulatory Visit (INDEPENDENT_AMBULATORY_CARE_PROVIDER_SITE_OTHER): Payer: Medicare Other | Admitting: Physician Assistant

## 2019-07-22 ENCOUNTER — Encounter: Payer: Self-pay | Admitting: Physician Assistant

## 2019-07-22 ENCOUNTER — Other Ambulatory Visit: Payer: Self-pay

## 2019-07-22 VITALS — BP 113/73 | HR 67 | Temp 98.5°F | Resp 16 | Wt 137.2 lb

## 2019-07-22 DIAGNOSIS — Z13220 Encounter for screening for lipoid disorders: Secondary | ICD-10-CM | POA: Diagnosis not present

## 2019-07-22 DIAGNOSIS — Z23 Encounter for immunization: Secondary | ICD-10-CM | POA: Diagnosis not present

## 2019-07-22 DIAGNOSIS — J45909 Unspecified asthma, uncomplicated: Secondary | ICD-10-CM

## 2019-07-22 DIAGNOSIS — E079 Disorder of thyroid, unspecified: Secondary | ICD-10-CM

## 2019-07-22 DIAGNOSIS — Z136 Encounter for screening for cardiovascular disorders: Secondary | ICD-10-CM | POA: Diagnosis not present

## 2019-07-22 DIAGNOSIS — Z1159 Encounter for screening for other viral diseases: Secondary | ICD-10-CM

## 2019-07-22 DIAGNOSIS — Z8619 Personal history of other infectious and parasitic diseases: Secondary | ICD-10-CM | POA: Diagnosis not present

## 2019-07-22 MED ORDER — FLUTICASONE FUROATE-VILANTEROL 200-25 MCG/INH IN AEPB: 1.0000 | INHALATION_SPRAY | Freq: Every day | RESPIRATORY_TRACT | 0 refills | Status: DC

## 2019-07-22 NOTE — Patient Instructions (Signed)
Health Maintenance After Age 71 After age 71, you are at a higher risk for certain long-term diseases and infections as well as injuries from falls. Falls are a major cause of broken bones and head injuries in people who are older than age 71. Getting regular preventive care can help to keep you healthy and well. Preventive care includes getting regular testing and making lifestyle changes as recommended by your health care provider. Talk with your health care provider about:  Which screenings and tests you should have. A screening is a test that checks for a disease when you have no symptoms.  A diet and exercise plan that is right for you. What should I know about screenings and tests to prevent falls? Screening and testing are the best ways to find a health problem early. Early diagnosis and treatment give you the best chance of managing medical conditions that are common after age 71. Certain conditions and lifestyle choices may make you more likely to have a fall. Your health care provider may recommend:  Regular vision checks. Poor vision and conditions such as cataracts can make you more likely to have a fall. If you wear glasses, make sure to get your prescription updated if your vision changes.  Medicine review. Work with your health care provider to regularly review all of the medicines you are taking, including over-the-counter medicines. Ask your health care provider about any side effects that may make you more likely to have a fall. Tell your health care provider if any medicines that you take make you feel dizzy or sleepy.  Osteoporosis screening. Osteoporosis is a condition that causes the bones to get weaker. This can make the bones weak and cause them to break more easily.  Blood pressure screening. Blood pressure changes and medicines to control blood pressure can make you feel dizzy.  Strength and balance checks. Your health care provider may recommend certain tests to check your  strength and balance while standing, walking, or changing positions.  Foot health exam. Foot pain and numbness, as well as not wearing proper footwear, can make you more likely to have a fall.  Depression screening. You may be more likely to have a fall if you have a fear of falling, feel emotionally low, or feel unable to do activities that you used to do.  Alcohol use screening. Using too much alcohol can affect your balance and may make you more likely to have a fall. What actions can I take to lower my risk of falls? General instructions  Talk with your health care provider about your risks for falling. Tell your health care provider if: ? You fall. Be sure to tell your health care provider about all falls, even ones that seem minor. ? You feel dizzy, sleepy, or off-balance.  Take over-the-counter and prescription medicines only as told by your health care provider. These include any supplements.  Eat a healthy diet and maintain a healthy weight. A healthy diet includes low-fat dairy products, low-fat (lean) meats, and fiber from whole grains, beans, and lots of fruits and vegetables. Home safety  Remove any tripping hazards, such as rugs, cords, and clutter.  Install safety equipment such as grab bars in bathrooms and safety rails on stairs.  Keep rooms and walkways well-lit. Activity   Follow a regular exercise program to stay fit. This will help you maintain your balance. Ask your health care provider what types of exercise are appropriate for you.  If you need a cane or   walker, use it as recommended by your health care provider.  Wear supportive shoes that have nonskid soles. Lifestyle  Do not drink alcohol if your health care provider tells you not to drink.  If you drink alcohol, limit how much you have: ? 0-1 drink a day for women. ? 0-2 drinks a day for men.  Be aware of how much alcohol is in your drink. In the [place] one drink equals one typical bottle of beer (12  oz), one-half glass of wine (5 oz), or one shot of hard liquor (1 oz).  Do not use any products that contain nicotine or tobacco, such as cigarettes and e-cigarettes. If you need help quitting, ask your health care provider. Summary  Having a healthy lifestyle and getting preventive care can help to protect your health and wellness after age 42.  Screening and testing are the best way to find a health problem early and help you avoid having a fall. Early diagnosis and treatment give you the best chance for managing medical conditions that are more common for people who are older than age 42.  Falls are a major cause of broken bones and head injuries in people who are older than age 89. Take precautions to prevent a fall at home.  Work with your health care provider to learn what changes you can make to improve your health and wellness and to prevent falls. This information is not intended to replace advice given to you by your health care provider. Make sure you discuss any questions you have with your health care provider. Document Released: 08/20/2017 Document Revised: 01/28/2019 Document Reviewed: 08/20/2017 Elsevier Patient Education  [place]. Fluticasone; Vilanterol inhalation powder What is this medicine? FLUTICASONE; VILANTEROL (floo TIK a sone; vye LAN ter ol) inhalation is a combination of two medicines that decrease inflammation and help to open up the airways of your lungs. It is for chronic obstructive pulmonary disease (COPD), including chronic bronchitis or emphysema. It is also used for asthma in adults to help control symptoms. Do NOT use for an acute asthma attack or COPD attack. This medicine may be used for other purposes; ask your health care provider or pharmacist if you have questions. COMMON BRAND NAME(S): BREO ELLIPTA What should I tell my health care provider before I take this medicine? They need to know if you have any of these conditions:  bone problems   diabetes  eye disease, vision problems  immune system problems  heart disease or irregular heartbeat  high blood pressure  infection  pheochromocytoma  seizures  thyroid disease  an unusual or allergic reaction to fluticasone, vilanterol, milk proteins, corticosteroids, other medicines, foods, dyes, or preservatives  pregnant or trying to get pregnant  breast-feeding How should I use this medicine? This medicine is inhaled through the mouth. It is used once per day. Follow the directions on the prescription label. Do not use a spacer device with this inhaler. Take your medicine at regular intervals. Do not take your medicine more often than directed. Do not stop taking except on your doctor's advice. Make sure that you are using your inhaler correctly. Ask you doctor or health care provider if you have any questions. A special MedGuide will be given to you by the pharmacist with each prescription and refill. Be sure to read this information carefully each time. Talk to your pediatrician regarding the use of this medicine in children. Special care may be needed. This medicine is not approved for use in  children under 4 years of age. Overdosage: If you think you have taken too much of this medicine contact a poison control center or emergency room at once. NOTE: This medicine is only for you. Do not share this medicine with others. What if I miss a dose? If you miss a dose, use it as soon as you can. If it is almost time for your next dose, use only that dose and continue with your regular schedule. Do not use double or extra doses. What may interact with this medicine? Do not take this medicine with any of the following medications:  cisapride  dofetilide  dronedarone  MAOIs like Carbex, Eldepryl, Marplan, Nardil, and Parnate  pimozide  thioridazine  ziprasidone This medicine may also interact with the following medications:  antiviral medicines for HIV or AIDS   beta-blockers like metoprolol and propranolol  certain medicines for depression, anxiety, or psychotic disturbances  certain medicines for fungal infections like ketoconazole, itraconazole, posaconazole, voriconazole  conivaptan  diuretics  medicines for colds  nefazodone  other medicines for breathing problems  other medicines that prolong the QT interval (cause an abnormal heart rhythm) This list may not describe all possible interactions. Give your health care provider a list of all the medicines, herbs, non-prescription drugs, or dietary supplements you use. Also tell them if you smoke, drink alcohol, or use illegal drugs. Some items may interact with your medicine. What should I watch for while using this medicine? Visit your doctor or health care professional for regular checkups. Tell your doctor or health care professional if your symptoms do not get better. Do not use this medicine more than once every 24 hours. NEVER use this medicine for an acute asthma or COPD attack. You should use your short-acting rescue inhalers for this purpose. If your symptoms get worse or if you need your short-acting inhalers more often, call your doctor right away. If you are going to have surgery tell your doctor or health care professional that you are using this medicine. Try not to come in contact with people with the chicken pox or measles. If you do, call your doctor. This medicine may increase blood sugar. Ask your healthcare provider if changes in diet or medicines are needed if you have diabetes. What side effects may I notice from receiving this medicine? Side effects that you should report to your doctor or health care professional as soon as possible:  allergic reactions like skin rash or hives, swelling of the face, lips, or tongue  breathing problems right after inhaling your medicine  chest pain  fast, irregular heartbeat  feeling faint or lightheaded, falls  fever or chills   nausea, vomiting  signs and symptoms of high blood sugar such as being more thirsty or hungry or having to urinate more than normal. You may also feel very tired or have blurry vision. Side effects that usually do not require medical attention (report to your doctor or health care professional if they continue or are bothersome):  cough  headache  nervousness  sore throat  tremor This list may not describe all possible side effects. Call your doctor for medical advice about side effects. You may report side effects to FDA at 1-800-FDA-1088. Where should I keep my medicine? Keep out of the reach of children. Store at room temperature between 15 and 30 degrees C (59 and 86 degrees F). Store in a dry place away from direct heat or sunlight. Throw away 6 weeks after you remove the inhaler  from the foil tray, or after the dose indicator reads 0, whichever comes first. Throw away any unopened packages after the expiration date. NOTE: This sheet is a summary. It may not cover all possible information. If you have questions about this medicine, talk to your doctor, pharmacist, or health care provider.  2020 Elsevier/Gold Standard (2018-07-08 11:19:39)

## 2019-07-23 ENCOUNTER — Telehealth: Payer: Self-pay

## 2019-07-23 LAB — COMPREHENSIVE METABOLIC PANEL
ALT: 23 IU/L (ref 0–32)
AST: 28 IU/L (ref 0–40)
Albumin/Globulin Ratio: 2.5 — ABNORMAL HIGH (ref 1.2–2.2)
Albumin: 4.9 g/dL — ABNORMAL HIGH (ref 3.7–4.7)
Alkaline Phosphatase: 78 IU/L (ref 39–117)
BUN/Creatinine Ratio: 28 (ref 12–28)
BUN: 26 mg/dL (ref 8–27)
Bilirubin Total: 0.3 mg/dL (ref 0.0–1.2)
CO2: 24 mmol/L (ref 20–29)
Calcium: 9.7 mg/dL (ref 8.7–10.3)
Chloride: 101 mmol/L (ref 96–106)
Creatinine, Ser: 0.94 mg/dL (ref 0.57–1.00)
GFR calc Af Amer: 71 mL/min/{1.73_m2} (ref >59)
GFR calc non Af Amer: 61 mL/min/{1.73_m2} (ref >59)
Globulin, Total: 2 g/dL (ref 1.5–4.5)
Glucose: 96 mg/dL (ref 65–99)
Potassium: 4.4 mmol/L (ref 3.5–5.2)
Sodium: 139 mmol/L (ref 134–144)
Total Protein: 6.9 g/dL (ref 6.0–8.5)

## 2019-07-23 LAB — CBC WITH DIFFERENTIAL/PLATELET
Basophils Absolute: 0.1 10*3/uL (ref 0.0–0.2)
Basos: 1 %
EOS (ABSOLUTE): 0.1 10*3/uL (ref 0.0–0.4)
Eos: 2 %
Hematocrit: 45.6 % (ref 34.0–46.6)
Hemoglobin: 15.4 g/dL (ref 11.1–15.9)
Immature Grans (Abs): 0 10*3/uL (ref 0.0–0.1)
Immature Granulocytes: 0 %
Lymphocytes Absolute: 1.1 10*3/uL (ref 0.7–3.1)
Lymphs: 30 %
MCH: 31.8 pg (ref 26.6–33.0)
MCHC: 33.8 g/dL (ref 31.5–35.7)
MCV: 94 fL (ref 79–97)
Monocytes Absolute: 0.4 10*3/uL (ref 0.1–0.9)
Monocytes: 10 %
Neutrophils Absolute: 2.1 10*3/uL (ref 1.4–7.0)
Neutrophils: 57 %
Platelets: 277 10*3/uL (ref 150–450)
RBC: 4.85 x10E6/uL (ref 3.77–5.28)
RDW: 11.8 % (ref 11.7–15.4)
WBC: 3.7 10*3/uL (ref 3.4–10.8)

## 2019-07-23 LAB — LIPID PANEL
Chol/HDL Ratio: 2.6 ratio (ref 0.0–4.4)
Cholesterol, Total: 244 mg/dL — ABNORMAL HIGH (ref 100–199)
HDL: 95 mg/dL (ref >39)
LDL Chol Calc (NIH): 112 mg/dL — ABNORMAL HIGH (ref 0–99)
Triglycerides: 223 mg/dL — ABNORMAL HIGH (ref 0–149)
VLDL Cholesterol Cal: 37 mg/dL (ref 5–40)

## 2019-07-23 LAB — TSH: TSH: 1.05 u[IU]/mL (ref 0.450–4.500)

## 2019-07-23 LAB — HEPATITIS C ANTIBODY: Hep C Virus Ab: <0.1 s/co ratio (ref 0.0–0.9)

## 2019-07-23 NOTE — Telephone Encounter (Signed)
Patient advised as directed below. 

## 2019-07-23 NOTE — Telephone Encounter (Signed)
-----   Message from Mar Daring, [place] sent at 07/23/2019  8:14 AM EDT ----- Blood count is normal. Kidney and liver function are normal. Sugar is normal. Sodium, potassium and calcium are normal. Thyroid is normal. Hep C antibody testing was negative. Cholesterol is elevated but HDL (good) cholesterol is high also. This offers cardioprotection. Just keep up with heart healthy dieting habits avoiding fatty foods, red meats and sugars. Keep up your exercise as well.

## 2019-07-27 ENCOUNTER — Telehealth: Payer: Self-pay | Admitting: Physician Assistant

## 2019-07-27 ENCOUNTER — Other Ambulatory Visit: Payer: Self-pay | Admitting: Physician Assistant

## 2019-07-27 DIAGNOSIS — J45909 Unspecified asthma, uncomplicated: Secondary | ICD-10-CM

## 2019-07-27 MED ORDER — FLUTICASONE FUROATE-VILANTEROL 200-25 MCG/INH IN AEPB: 1.0000 | INHALATION_SPRAY | Freq: Every day | RESPIRATORY_TRACT | 11 refills | Status: DC

## 2019-07-27 NOTE — Telephone Encounter (Signed)
Pt called about switching her Advair to Brio,  She wants to know how to switch over.  CB#  [phone]  teri

## 2019-07-27 NOTE — Telephone Encounter (Signed)
Breo sent in to [place]

## 2019-07-29 ENCOUNTER — Telehealth: Payer: Self-pay

## 2019-07-29 NOTE — Telephone Encounter (Signed)
LMTCB

## 2019-07-29 NOTE — Telephone Encounter (Signed)
Patient advised.

## 2019-07-29 NOTE — Telephone Encounter (Signed)
She can just switch. [facility] [place] is only once daily

## 2019-07-29 NOTE — Telephone Encounter (Signed)
Opened in error

## 2019-07-29 NOTE — Telephone Encounter (Signed)
Patient called wanting to know if there were any specific instructions for stopping the Advair and starting the [facility], or can she simply stop one medication and start the new medication? Patient states she has been waiting for 2 days for instructions.  Please advise.

## 2019-08-09 ENCOUNTER — Telehealth: Payer: Self-pay | Admitting: Physician Assistant

## 2019-08-09 DIAGNOSIS — J45909 Unspecified asthma, uncomplicated: Secondary | ICD-10-CM

## 2019-08-09 DIAGNOSIS — J9811 Atelectasis: Secondary | ICD-10-CM

## 2019-08-09 NOTE — Telephone Encounter (Signed)
Please Review

## 2019-08-09 NOTE — Telephone Encounter (Signed)
fluticasone furoate-vilanterol (BREO ELLIPTA) 200-25 MCG/INH AEPB Pt needing to ask some questions on using the breo and nebulizer. Pt used this and was gasping for air for 10 min yesterday.   Please call pt back at [phone].  Thanks, [employer]

## 2019-08-09 NOTE — Telephone Encounter (Signed)
She can avoid using the breo. I would recommend referral to pulmonology for further studies (like spirometry since we cannot perform in office) and medication management for her.

## 2019-08-10 NOTE — Telephone Encounter (Signed)
Patient agreed to referral to Pulmonologist in the [facility]. She reports that yesterday she took the Albuterol at 2:30 and took some of he symptoms away. She took the Breo at 5:30pm reports  it worked really good and she was able to sleep well.  She reports that with her switching the Advair the leg cramps have stopped in the last 2 nights.

## 2019-08-17 ENCOUNTER — Encounter: Payer: Self-pay | Admitting: Pulmonary Disease

## 2019-08-17 ENCOUNTER — Telehealth: Payer: Self-pay | Admitting: Pulmonary Disease

## 2019-08-17 ENCOUNTER — Ambulatory Visit: Payer: Medicare Other | Admitting: Pulmonary Disease

## 2019-08-17 ENCOUNTER — Other Ambulatory Visit: Payer: Self-pay

## 2019-08-17 VITALS — BP 120/78 | HR 63 | Temp 97.6°F | Ht 67.0 in | Wt 139.2 lb

## 2019-08-17 DIAGNOSIS — J45909 Unspecified asthma, uncomplicated: Secondary | ICD-10-CM

## 2019-08-17 DIAGNOSIS — R0602 Shortness of breath: Secondary | ICD-10-CM

## 2019-08-17 MED ORDER — QVAR REDIHALER 80 MCG/ACT IN AERB: 2.0000 | INHALATION_SPRAY | Freq: Two times a day (BID) | RESPIRATORY_TRACT | 6 refills | Status: DC

## 2019-08-17 MED ORDER — FLUTICASONE PROPIONATE HFA 220 MCG/ACT IN AERO: 1.0000 | INHALATION_SPRAY | Freq: Two times a day (BID) | RESPIRATORY_TRACT | 1 refills | Status: DC

## 2019-08-17 NOTE — Patient Instructions (Addendum)
1.  We will schedule breathing tests.  2.  We have gone blood test to check for allergies and also to check your immunity.  Also checking for alpha-1 antitrypsin which is a hereditary disease of the lungs.  3.  We will give a trial of Qvar 2 puffs twice a day.  Use your albuterol before the Qvar.  Rinse your mouth well after the Qvar.  You may put some baking soda in the water that you rinse with.  4.  We may need you to see your allergist again.  5.  If your drug plan does not cover the Qvar continue using the Breo until we can get an exception for it.   6.  We will see him in follow-up in 3 to 4 weeks time call sooner if you have any difficulties.

## 2019-08-17 NOTE — Telephone Encounter (Signed)
Call made to patient, confirmed DOB, made aware we received paper work from her pharmacy. Dr. Darnell Level reviewed alternative for her Qvar and she recommends Flovent 220 1 puff bid. Voiced understanding. Patient in agreement with this. Sent to pharmacy.   Made aware she will need to go to [facility] 10.28.2020 before 11am for pre-procedural covid testing and to remain in her car. Voiced understanding. Nothing further needed at this time.

## 2019-08-18 ENCOUNTER — Other Ambulatory Visit
Admission: RE | Admit: 2019-08-18 | Discharge: 2019-08-18 | Disposition: A | Discharge status: Home / Self Care | Payer: Medicare Other | Source: Ambulatory Visit | Attending: Pulmonary Disease | Admitting: Pulmonary Disease

## 2019-08-18 DIAGNOSIS — Z01812 Encounter for preprocedural laboratory examination: Secondary | ICD-10-CM | POA: Diagnosis not present

## 2019-08-18 DIAGNOSIS — Z20828 Contact with and (suspected) exposure to other viral communicable diseases: Secondary | ICD-10-CM | POA: Diagnosis not present

## 2019-08-18 LAB — SARS CORONAVIRUS 2 (TAT 6-24 HRS): SARS Coronavirus 2: NEGATIVE

## 2019-08-19 ENCOUNTER — Ambulatory Visit (HOSPITAL_COMMUNITY): Payer: Medicare Other

## 2019-08-19 ENCOUNTER — Other Ambulatory Visit
Admission: RE | Admit: 2019-08-19 | Discharge: 2019-08-19 | Disposition: A | Discharge status: Home / Self Care | Payer: Medicare Other | Source: Ambulatory Visit | Attending: Pulmonary Disease | Admitting: Pulmonary Disease

## 2019-08-19 ENCOUNTER — Other Ambulatory Visit: Payer: Self-pay

## 2019-08-19 DIAGNOSIS — J45909 Unspecified asthma, uncomplicated: Secondary | ICD-10-CM

## 2019-08-19 LAB — CBC WITH DIFFERENTIAL/PLATELET
Abs Immature Granulocytes: 0.02 10*3/uL (ref 0.00–0.07)
Basophils Absolute: 0 10*3/uL (ref 0.0–0.1)
Basophils Relative: 1 %
Eosinophils Absolute: 0.1 10*3/uL (ref 0.0–0.5)
Eosinophils Relative: 2 %
HCT: 40.2 % (ref 36.0–46.0)
Hemoglobin: 13.9 g/dL (ref 12.0–15.0)
Immature Granulocytes: 1 %
Lymphocytes Relative: 27 %
Lymphs Abs: 1.1 10*3/uL (ref 0.7–4.0)
MCH: 31.7 pg (ref 26.0–34.0)
MCHC: 34.6 g/dL (ref 30.0–36.0)
MCV: 91.8 fL (ref 80.0–100.0)
Monocytes Absolute: 0.4 10*3/uL (ref 0.1–1.0)
Monocytes Relative: 10 %
Neutro Abs: 2.4 10*3/uL (ref 1.7–7.7)
Neutrophils Relative %: 59 %
Platelets: 235 10*3/uL (ref 150–400)
RBC: 4.38 MIL/uL (ref 3.87–5.11)
RDW: 12.1 % (ref 11.5–15.5)
WBC: 3.9 10*3/uL — ABNORMAL LOW (ref 4.0–10.5)
nRBC: 0 % (ref 0.0–0.2)

## 2019-08-19 MED ORDER — ALBUTEROL SULFATE (2.5 MG/3ML) 0.083% IN NEBU
2.5000 mg | INHALATION_SOLUTION | Freq: Once | RESPIRATORY_TRACT | Status: AC
  Administered 2019-08-19: 10:00:00 2.5 mg via RESPIRATORY_TRACT
  Filled 2019-08-19: qty 3

## 2019-08-22 LAB — IGG, IGA, IGM
IgA: 146 mg/dL (ref 64–422)
IgG (Immunoglobin G), Serum: 819 mg/dL (ref 586–1602)
IgM (Immunoglobulin M), Srm: 63 mg/dL (ref 26–217)

## 2019-08-24 LAB — ALLERGENS W/TOTAL IGE AREA 2
Alternaria Alternata IgE: 0.19 kU/L — AB
Aspergillus Fumigatus IgE: <0.1 kU/L
Bermuda Grass IgE: <0.1 kU/L
Cat Dander IgE: <0.1 kU/L
Cedar, Mountain IgE: <0.1 kU/L
Cladosporium Herbarum IgE: <0.1 kU/L
Cockroach, German IgE: <0.1 kU/L
Common Silver Birch IgE: <0.1 kU/L
Cottonwood IgE: <0.1 kU/L
D Farinae IgE: <0.1 kU/L
D Pteronyssinus IgE: <0.1 kU/L
Dog Dander IgE: <0.1 kU/L
Elm, American IgE: <0.1 kU/L
IgE (Immunoglobulin E), Serum: 15 IU/mL (ref 6–495)
Johnson Grass IgE: <0.1 kU/L
Maple/Box Elder IgE: <0.1 kU/L
Mouse Urine IgE: <0.1 kU/L
Oak, White IgE: <0.1 kU/L
Pecan, Hickory IgE: <0.1 kU/L
Penicillium Chrysogen IgE: <0.1 kU/L
Pigweed, Rough IgE: <0.1 kU/L
Ragweed, Short IgE: 0.12 kU/L — AB
Sheep Sorrel IgE Qn: <0.1 kU/L
Timothy Grass IgE: <0.1 kU/L
White Mulberry IgE: <0.1 kU/L

## 2019-09-03 ENCOUNTER — Other Ambulatory Visit: Payer: Self-pay | Admitting: Physician Assistant

## 2019-09-06 ENCOUNTER — Encounter: Payer: Self-pay | Admitting: Pulmonary Disease

## 2019-09-06 ENCOUNTER — Ambulatory Visit: Payer: Medicare Other | Admitting: Pulmonary Disease

## 2019-09-06 ENCOUNTER — Other Ambulatory Visit: Payer: Self-pay

## 2019-09-06 VITALS — BP 128/72 | HR 55 | Temp 97.9°F | Ht 67.0 in | Wt 140.0 lb

## 2019-09-06 DIAGNOSIS — J4530 Mild persistent asthma, uncomplicated: Secondary | ICD-10-CM

## 2019-09-06 DIAGNOSIS — R0602 Shortness of breath: Secondary | ICD-10-CM

## 2019-09-06 DIAGNOSIS — B370 Candidal stomatitis: Secondary | ICD-10-CM

## 2019-09-06 DIAGNOSIS — J45909 Unspecified asthma, uncomplicated: Secondary | ICD-10-CM

## 2019-09-06 MED ORDER — AEROCHAMBER MV MISC: 0 refills | Status: AC

## 2019-09-06 MED ORDER — FLUCONAZOLE 100 MG PO TABS: 100.0000 mg | ORAL_TABLET | Freq: Every day | ORAL | 0 refills | Status: AC

## 2019-09-06 MED ORDER — ALBUTEROL SULFATE (2.5 MG/3ML) 0.083% IN NEBU: 2.5000 mg | INHALATION_SOLUTION | Freq: Four times a day (QID) | RESPIRATORY_TRACT | 12 refills | Status: DC | PRN

## 2019-09-06 NOTE — Telephone Encounter (Signed)
Opened in error

## 2019-09-06 NOTE — Patient Instructions (Signed)
1.  You have been provided a spacer for your inhaler.  Rinse your mouth well with a solution of baking soda after using your inhaler.  2.  You have been provided a prescription for fluconazole for thrush.  3.  We renewed your albuterol for nebulizer solution.  4.  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  5.  See in follow-up in 3 months time please call sooner should any new respiratory issues arise.

## 2019-09-13 ENCOUNTER — Telehealth: Payer: Self-pay | Admitting: Pulmonary Disease

## 2019-09-13 NOTE — Telephone Encounter (Signed)
Per LG verbally- alpha 1 is normal.  Pt is aware and voiced her understanding.  Nothing further is needed.

## 2019-09-20 ENCOUNTER — Telehealth: Payer: Self-pay | Admitting: Pulmonary Disease

## 2019-09-20 MED ORDER — ALBUTEROL SULFATE (2.5 MG/3ML) 0.083% IN NEBU: 2.5000 mg | INHALATION_SOLUTION | Freq: Four times a day (QID) | RESPIRATORY_TRACT | 12 refills | Status: DC | PRN

## 2019-09-20 NOTE — Telephone Encounter (Signed)
Spoke to pt, who stated that ICD code is needed for albuterol neb solution.  I have contacted [facility], who verified that ICD code was in fact needed. Rx has been resent with ICD code.  Nothing further is needed.

## 2019-09-20 NOTE — Telephone Encounter (Signed)
Pt states that her insurance will not cover albuterol (PROVENTIL) (2.5 MG/3ML) 0.083% nebulizer solution . She said that if there is a ICD-10 code that can be used to file under Medicare part B. Her Medicare part B will cover the machine, just not the solution. Or pt needs alternative medication called in that will be covered by her insurance company. She would like a call back at [phone] when this has been taken care of.

## 2019-09-21 MED ORDER — ALBUTEROL SULFATE (2.5 MG/3ML) 0.083% IN NEBU: 2.5000 mg | INHALATION_SOLUTION | Freq: Four times a day (QID) | RESPIRATORY_TRACT | 12 refills | Status: AC | PRN

## 2019-09-21 NOTE — Addendum Note (Signed)
Addended by: Maryanna Shape A on: 09/21/2019 02:15 PM   Modules accepted: Orders

## 2019-10-04 DIAGNOSIS — Z1231 Encounter for screening mammogram for malignant neoplasm of breast: Secondary | ICD-10-CM | POA: Diagnosis not present

## 2019-10-04 LAB — HM MAMMOGRAPHY

## 2019-10-06 ENCOUNTER — Encounter: Payer: Self-pay | Admitting: Physician Assistant

## 2019-10-08 ENCOUNTER — Other Ambulatory Visit: Payer: Self-pay | Admitting: Physician Assistant

## 2019-10-08 DIAGNOSIS — J45909 Unspecified asthma, uncomplicated: Secondary | ICD-10-CM

## 2019-10-08 NOTE — Telephone Encounter (Signed)
Requested medication (s) are due for refill today: yes  Requested medication (s) are on the active medication list: yes  Last refill:  07/27/2019  Future visit scheduled:no  Notes to clinic: one inhaler should last one month Review for refill   Requested Prescriptions  Pending Prescriptions Disp Refills   albuterol (VENTOLIN HFA) 108 (90 Base) MCG/ACT inhaler [Pharmacy Med Name: ALBUTEROL HFA (VENTOLIN) INH]  1    Sig: TAKE 2 PUFFS BY MOUTH EVERY 6 HOURS AS NEEDED FOR WHEEZE OR SHORTNESS OF BREATH      Pulmonology:  Beta Agonists Failed - 10/08/2019  8:08 AM      Failed - One inhaler should last at least one month. If the patient is requesting refills earlier, contact the patient to check for uncontrolled symptoms.      Passed - Valid encounter within last 12 months    Recent Outpatient Visits           2 months ago Thyroid disease   [place], [place]   3 months ago Pulmonary emphysema, unspecified emphysema type [facility])   [place], PA-C   6 months ago Shortness of breath   [place], [place], [place]   8 months ago Lymphadenopathy, anterior cervical   [place], [place]   11 months ago Persistent asthma without complication, unspecified asthma severity   [place], [place], [place]

## 2019-10-30 ENCOUNTER — Other Ambulatory Visit: Payer: Self-pay | Admitting: Pulmonary Disease

## 2019-12-02 ENCOUNTER — Other Ambulatory Visit: Payer: Self-pay

## 2019-12-02 MED ORDER — FLUTICASONE PROPIONATE HFA 220 MCG/ACT IN AERO: 1.0000 | INHALATION_SPRAY | Freq: Two times a day (BID) | RESPIRATORY_TRACT | 1 refills | Status: DC

## 2019-12-02 NOTE — Telephone Encounter (Signed)
Received call from [place] with [facility], who is requesting refill on Flovent.  Rx has been sent to preferred pharmacy. Nothing further is needed.

## 2020-01-18 ENCOUNTER — Other Ambulatory Visit: Payer: Self-pay | Admitting: Physician Assistant

## 2020-01-18 NOTE — Telephone Encounter (Signed)
Requested Prescriptions  Pending Prescriptions Disp Refills  . levothyroxine (SYNTHROID) 75 MCG tablet [Pharmacy Med Name: LEVOTHYROXINE 75 MCG TABLET] 90 tablet 1    Sig: TAKE 1 TABLET BY MOUTH EVERY DAY ON AN EMPTY STOMACH     Endocrinology:  Hypothyroid Agents Failed - 01/18/2020  9:55 AM      Failed - TSH needs to be rechecked within 3 months after an abnormal result. Refill until TSH is due.      Passed - TSH in normal range and within 360 days    TSH  Date Value Ref Range Status  07/22/2019 1.050 0.450 - 4.500 uIU/mL Final         Passed - Valid encounter within last 12 months    Recent Outpatient Visits          6 months ago Thyroid disease   [place], [place]   7 months ago Pulmonary emphysema, unspecified emphysema type [facility])   [place], [place] M, [place]   9 months ago Shortness of breath   [place], [place]   1 year ago Lymphadenopathy, anterior cervical   [facility] [place], [place], [place]   1 year ago Persistent asthma without complication, unspecified asthma severity   [place], Vickki Muff, [place]

## 2020-01-24 DIAGNOSIS — H2513 Age-related nuclear cataract, bilateral: Secondary | ICD-10-CM | POA: Diagnosis not present

## 2020-01-24 DIAGNOSIS — H43811 Vitreous degeneration, right eye: Secondary | ICD-10-CM | POA: Diagnosis not present

## 2020-01-24 DIAGNOSIS — H1045 Other chronic allergic conjunctivitis: Secondary | ICD-10-CM | POA: Diagnosis not present

## 2020-03-14 ENCOUNTER — Other Ambulatory Visit: Payer: Self-pay | Admitting: Physician Assistant

## 2020-03-21 ENCOUNTER — Ambulatory Visit (INDEPENDENT_AMBULATORY_CARE_PROVIDER_SITE_OTHER): Payer: Medicare Other | Admitting: Physician Assistant

## 2020-03-21 ENCOUNTER — Encounter: Payer: Self-pay | Admitting: Physician Assistant

## 2020-03-21 ENCOUNTER — Other Ambulatory Visit: Payer: Self-pay

## 2020-03-21 VITALS — BP 108/72 | HR 62 | Temp 95.9°F | Wt 134.0 lb

## 2020-03-21 DIAGNOSIS — Z820 Family history of epilepsy and other diseases of the nervous system: Secondary | ICD-10-CM | POA: Diagnosis not present

## 2020-03-21 DIAGNOSIS — R251 Tremor, unspecified: Secondary | ICD-10-CM

## 2020-03-21 DIAGNOSIS — E079 Disorder of thyroid, unspecified: Secondary | ICD-10-CM | POA: Diagnosis not present

## 2020-03-21 NOTE — Progress Notes (Signed)
I,Laura E Walsh,acting as a Education administrator for [employer], PA-C.,have documented all relevant documentation on the behalf of Mar Daring, PA-C,as directed by  Mar Daring, PA-C while in the presence of Mar Daring, [place].   Established patient visit   Patient: Kimberly Mercer   DOB: [birth year]   74 y.o. Female  MRN: [id number] Visit Date: 03/21/2020  Today's healthcare provider: Mar Daring, PA-C   Chief Complaint  Patient presents with  . Hypothyroidism  . Tremors   Subjective    Thyroid Problem Presents for follow-up visit. Symptoms include cold intolerance, constipation, diarrhea, heat intolerance, tremors and weight loss. Patient reports no dry skin, fatigue or hair loss. The symptoms have been stable.   Tremor: She complains of tremor. Tremor primarily involves the left hand and jaw.  Onset of symptoms was gradual, starting about 1 year ago. Symptoms are currently of mild severity. Tremor exacerbated by nothing. Tremor is alleviated by nothing. Symptoms occur intermittently  She also describes symptoms of unilateral hand tremor, voice change, difficulty with swallowing and balance problems. She denies drooling during sleep (wet pillows).     Patient Active Problem List   Diagnosis Date Noted  . Syncope 04/30/2018  . Chronic midline low back pain 03/31/2018  . Primary osteoarthritis of both first carpometacarpal joints 12/18/2015  . Thyroid disease 01/03/2014  . Tachycardia 01/03/2014  . Palpitation 01/03/2014  . Menopause 07/06/2012  . Asthma 07/06/2012  . GERD (gastroesophageal reflux disease) 07/06/2012  . Allergic rhinitis 07/06/2012  . Varicose veins 07/06/2012   Past Medical History:  Diagnosis Date  . Allergic rhinitis, cause unspecified   . Allergy   . Asthma   . Benign neoplasm of colon   . Cancer ([place])   . Candidiasis of the esophagus   . Chicken pox   . Diverticulitis of colon (without mention of hemorrhage)(562.11)     . E coli enteritis   . GERD (gastroesophageal reflux disease)   . Hemorrhoids    internal  . Measles   . Osteoarthrosis, unspecified whether generalized or localized, unspecified site   . Symptomatic menopausal or female climacteric states   . Unspecified asthma(493.90)   . Unspecified essential hypertension   . Unspecified hypothyroidism    Past Surgical History:  Procedure Laterality Date  . cardiac catherization  12/09/2008   normal coranaries,normal EF.  . dilatation and curettage     due to SAB x 2  . thyroid nodule  1990   benign  . TOTAL ABDOMINAL HYSTERECTOMY W/ BILATERAL SALPINGOOPHORECTOMY  2003   complex endometrial hyperplasia, DUB, fibroids  . WISDOM TOOTH EXTRACTION     Social History   Tobacco Use  . Smoking status: Never Smoker  . Smokeless tobacco: Never Used  Substance Use Topics  . Alcohol use: No  . Drug use: No   Allergies  Allergen Reactions  . Claritin [Loratadine]     Panic attacks  . Fructose Diarrhea  . Gluten Meal Other (See Comments)    GI UPSET - CRAMPING  . Lactase Other (See Comments)    Pain, bloating abdominal pain  . Levaquin [Levofloxacin] Nausea Only    Yeast infection  . Sulfa Drugs Cross Reactors Hives     Medications: Outpatient Medications Prior to Visit  Medication Sig  . acetaminophen (TYLENOL 8 HOUR) 650 MG CR tablet Take 1,300 mg by mouth as needed for pain.  Marland Kitchen albuterol (PROVENTIL) (2.5 MG/3ML) 0.083% nebulizer solution Take 3 mLs (2.5 mg total)  by nebulization every 6 (six) hours as needed for wheezing or shortness of breath. Dx:J45.909  . albuterol (VENTOLIN HFA) 108 (90 Base) MCG/ACT inhaler TAKE 2 PUFFS BY MOUTH EVERY 6 HOURS AS NEEDED FOR WHEEZE OR SHORTNESS OF BREATH  . aspirin 81 MG tablet Take 81 mg by mouth daily.  Marland Kitchen CALCIUM-MAGNESUIUM-ZINC 333-133-8.3 MG TABS Take 3 tablets by mouth daily.  Marland Kitchen CANNABIDIOL PO Take 30 mg by mouth daily. Powder  . Cholecalciferol (VITAMIN D3) 2000 units TABS Take 2,000 Units  by mouth daily.   . diclofenac sodium (VOLTAREN) 1 % GEL Apply 2 g topically 4 (four) times daily as needed.  . Famotidine (PEPCID PO) Take 20 mg by mouth as needed.   Marland Kitchen FLOVENT HFA 220 MCG/ACT inhaler TAKE 1 PUFF BY MOUTH TWICE A DAY *COVERED $52.09*  . fluticasone (FLONASE) 50 MCG/ACT nasal spray SPRAY 2 SPRAYS INTO EACH NOSTRIL EVERY DAY  . fluticasone (FLOVENT HFA) 220 MCG/ACT inhaler Inhale 1 puff into the lungs 2 (two) times daily.  Marland Kitchen ipratropium (ATROVENT) 0.02 % nebulizer solution Take 1.25 mLs (0.25 mg total) by nebulization every 4 (four) hours as needed for wheezing or shortness of breath.  . levothyroxine (SYNTHROID) 75 MCG tablet TAKE 1 TABLET BY MOUTH EVERY DAY ON AN EMPTY STOMACH  . montelukast (SINGULAIR) 10 MG tablet TAKE 1 TABLET BY MOUTH IN THE EVENING  . Multiple Vitamin (MULTIVITAMIN) tablet Take 1 tablet by mouth daily.  . mupirocin ointment (BACTROBAN) 2 % Apply topically 2 (two) times daily. (Patient taking differently: Apply topically 2 (two) times daily. As needed)  . Spacer/Aero-Holding Chambers (AEROCHAMBER MV) inhaler Use as instructed  . vitamin E (VITAMIN E) 400 UNIT capsule Take 800 Units by mouth daily.    No facility-administered medications prior to visit.    Review of Systems  Constitutional: Positive for weight loss. Negative for fatigue.  Respiratory: Negative for apnea, cough, choking, chest tightness, shortness of breath, wheezing and stridor.   Cardiovascular: Negative.   Gastrointestinal: Positive for constipation and diarrhea. Negative for abdominal distention, abdominal pain, anal bleeding, blood in stool, nausea, rectal pain and vomiting.  Endocrine: Positive for cold intolerance and heat intolerance. Negative for polydipsia, polyphagia and polyuria.  Allergic/Immunologic: Positive for environmental allergies.  Neurological: Positive for tremors. Negative for dizziness, weakness, light-headedness and headaches.    Last CBC Lab Results   Component Value Date   WBC 4.7 03/21/2020   HGB 15.0 03/21/2020   HCT 44.4 03/21/2020   MCV 95 03/21/2020   MCH 31.9 03/21/2020   RDW 12.1 03/21/2020   PLT 266 [id number]   Last metabolic panel Lab Results  Component Value Date   GLUCOSE 81 03/21/2020   NA 139 03/21/2020   K 4.1 03/21/2020   CL 101 03/21/2020   CO2 22 03/21/2020   BUN 24 03/21/2020   CREATININE 0.77 03/21/2020   GFRNONAA 78 03/21/2020   GFRAA 90 03/21/2020   CALCIUM 9.8 03/21/2020   PROT 6.8 03/21/2020   ALBUMIN 4.7 03/21/2020   LABGLOB 2.1 03/21/2020   AGRATIO 2.2 03/21/2020   BILITOT 0.2 03/21/2020   ALKPHOS 72 03/21/2020   AST 25 03/21/2020   ALT 19 03/21/2020   ANIONGAP 10 05/01/2018   Last lipids Lab Results  Component Value Date   CHOL 244 (H) 07/22/2019   HDL 95 07/22/2019   LDLCALC 112 (H) 07/22/2019   TRIG 223 (H) 07/22/2019   CHOLHDL 2.6 07/22/2019   Last hemoglobin A1c Lab Results  Component Value Date  HGBA1C 5.5 12/26/2011   Last thyroid functions Lab Results  Component Value Date   TSH 1.130 03/21/2020   T4TOTAL 7.1 03/21/2020    Objective    BP 108/72 (BP Location: Left Arm, Patient Position: Sitting, Cuff Size: Normal)   Pulse 62   Temp (!) 95.9 F (35.5 C) (Temporal)   Wt 134 lb (60.8 kg)   SpO2 98%   BMI 20.99 kg/m   Wt Readings from Last 3 Encounters:  03/21/20 134 lb (60.8 kg)  09/06/19 140 lb (63.5 kg)  08/17/19 139 lb 3.2 oz (63.1 kg)     Physical Exam Vitals reviewed.  Constitutional:      General: She is not in acute distress.    Appearance: Normal appearance. She is well-developed and normal weight. She is not ill-appearing or diaphoretic.  Cardiovascular:     Rate and Rhythm: Normal rate and regular rhythm.     Pulses: Normal pulses.     Heart sounds: Normal heart sounds. No murmur. No friction rub. No gallop.   Pulmonary:     Effort: Pulmonary effort is normal. No respiratory distress.     Breath sounds: Normal breath sounds. No wheezing or  rales.  Musculoskeletal:     Cervical back: Normal range of motion and neck supple.  Skin:    General: Skin is warm.  Neurological:     General: No focal deficit present.     Mental Status: She is alert.      Results for orders placed or performed in visit on 03/21/20  CBC w/Diff/Platelet  Result Value Ref Range   WBC 4.7 3.4 - 10.8 x10E3/uL   RBC 4.70 3.77 - 5.28 x10E6/uL   Hemoglobin 15.0 11.1 - 15.9 g/dL   Hematocrit 44.4 34.0 - 46.6 %   MCV 95 79 - 97 fL   MCH 31.9 26.6 - 33.0 pg   MCHC 33.8 31.5 - 35.7 g/dL   RDW 12.1 11.7 - 15.4 %   Platelets 266 150 - 450 x10E3/uL   Neutrophils 63 Not Estab. %   Lymphs 24 Not Estab. %   Monocytes 11 Not Estab. %   Eos 1 Not Estab. %   Basos 1 Not Estab. %   Neutrophils Absolute 3.0 1.4 - 7.0 x10E3/uL   Lymphocytes Absolute 1.1 0.7 - 3.1 x10E3/uL   Monocytes Absolute 0.5 0.1 - 0.9 x10E3/uL   EOS (ABSOLUTE) 0.1 0.0 - 0.4 x10E3/uL   Basophils Absolute 0.0 0.0 - 0.2 x10E3/uL   Immature Granulocytes 0 Not Estab. %   Immature Grans (Abs) 0.0 0.0 - 0.1 x10E3/uL  Comprehensive Metabolic Panel (CMET)  Result Value Ref Range   Glucose 81 65 - 99 mg/dL   BUN 24 8 - 27 mg/dL   Creatinine, Ser 0.77 0.57 - 1.00 mg/dL   GFR calc non Af Amer 78 >59 mL/min/1.73   GFR calc Af Amer 90 >59 mL/min/1.73   BUN/Creatinine Ratio 31 (H) 12 - 28   Sodium 139 134 - 144 mmol/L   Potassium 4.1 3.5 - 5.2 mmol/L   Chloride 101 96 - 106 mmol/L   CO2 22 20 - 29 mmol/L   Calcium 9.8 8.7 - 10.3 mg/dL   Total Protein 6.8 6.0 - 8.5 g/dL   Albumin 4.7 3.7 - 4.7 g/dL   Globulin, Total 2.1 1.5 - 4.5 g/dL   Albumin/Globulin Ratio 2.2 1.2 - 2.2   Bilirubin Total 0.2 0.0 - 1.2 mg/dL   Alkaline Phosphatase 72 48 - 121 IU/L  AST 25 0 - 40 IU/L   ALT 19 0 - 32 IU/L  T4 AND TSH  Result Value Ref Range   TSH 1.130 0.450 - 4.500 uIU/mL   T4, Total 7.1 4.5 - 12.0 ug/dL  B12 and Folate Panel  Result Value Ref Range   Vitamin B-12 518 232 - 1,245 pg/mL   Folate  >20.0 >3.0 ng/mL  Vitamin D (25 hydroxy)  Result Value Ref Range   Vit D, 25-Hydroxy 50.1 30.0 - 100.0 ng/mL    Assessment & Plan     1. Thyroid disease Stable on levothyroxine 48mcg. Will check labs as below and f/u pending results. - T4 AND TSH  2. Tremor of left hand Will check labs as below to r/o vit def as source of the tremors. If normal will see about getting MRI and refer to Neurology. There is family history of Parkinson's disease in her father.  - CBC w/Diff/Platelet - Comprehensive Metabolic Panel (CMET) - 123456 and Folate Panel - Vitamin D (25 hydroxy)  3. Tremor of unknown origin Tremor in left hand and jaw. Also occasionally feels an internal tremor in her chest. See above medical treatment plan. - CBC w/Diff/Platelet - Comprehensive Metabolic Panel (CMET) - 123456 and Folate Panel - Vitamin D (25 hydroxy)  4. Family history of Parkinson disease Father. See above medical treatment plan. - CBC w/Diff/Platelet - Comprehensive Metabolic Panel (CMET) - 123456 and Folate Panel - Vitamin D (25 hydroxy)  Return if symptoms worsen or fail to improve.      Reynolds Bowl, PA-C, have reviewed all documentation for this visit. The documentation on 03/22/20 for the exam, diagnosis, procedures, and orders are all accurate and complete.   Rubye Beach  [facility] [phone] (phone) [phone] (fax)  [place]

## 2020-03-21 NOTE — Patient Instructions (Signed)

## 2020-03-22 ENCOUNTER — Telehealth: Payer: Self-pay

## 2020-03-22 ENCOUNTER — Encounter: Payer: Self-pay | Admitting: Physician Assistant

## 2020-03-22 DIAGNOSIS — R251 Tremor, unspecified: Secondary | ICD-10-CM

## 2020-03-22 DIAGNOSIS — Z820 Family history of epilepsy and other diseases of the nervous system: Secondary | ICD-10-CM

## 2020-03-22 LAB — CBC WITH DIFFERENTIAL/PLATELET
Basophils Absolute: 0 10*3/uL (ref 0.0–0.2)
Basos: 1 %
EOS (ABSOLUTE): 0.1 10*3/uL (ref 0.0–0.4)
Eos: 1 %
Hematocrit: 44.4 % (ref 34.0–46.6)
Hemoglobin: 15 g/dL (ref 11.1–15.9)
Immature Grans (Abs): 0 10*3/uL (ref 0.0–0.1)
Immature Granulocytes: 0 %
Lymphocytes Absolute: 1.1 10*3/uL (ref 0.7–3.1)
Lymphs: 24 %
MCH: 31.9 pg (ref 26.6–33.0)
MCHC: 33.8 g/dL (ref 31.5–35.7)
MCV: 95 fL (ref 79–97)
Monocytes Absolute: 0.5 10*3/uL (ref 0.1–0.9)
Monocytes: 11 %
Neutrophils Absolute: 3 10*3/uL (ref 1.4–7.0)
Neutrophils: 63 %
Platelets: 266 10*3/uL (ref 150–450)
RBC: 4.7 x10E6/uL (ref 3.77–5.28)
RDW: 12.1 % (ref 11.7–15.4)
WBC: 4.7 10*3/uL (ref 3.4–10.8)

## 2020-03-22 LAB — VITAMIN D 25 HYDROXY (VIT D DEFICIENCY, FRACTURES): Vit D, 25-Hydroxy: 50.1 ng/mL (ref 30.0–100.0)

## 2020-03-22 LAB — COMPREHENSIVE METABOLIC PANEL
ALT: 19 IU/L (ref 0–32)
AST: 25 IU/L (ref 0–40)
Albumin/Globulin Ratio: 2.2 (ref 1.2–2.2)
Albumin: 4.7 g/dL (ref 3.7–4.7)
Alkaline Phosphatase: 72 IU/L (ref 48–121)
BUN/Creatinine Ratio: 31 — ABNORMAL HIGH (ref 12–28)
BUN: 24 mg/dL (ref 8–27)
Bilirubin Total: 0.2 mg/dL (ref 0.0–1.2)
CO2: 22 mmol/L (ref 20–29)
Calcium: 9.8 mg/dL (ref 8.7–10.3)
Chloride: 101 mmol/L (ref 96–106)
Creatinine, Ser: 0.77 mg/dL (ref 0.57–1.00)
GFR calc Af Amer: 90 mL/min/{1.73_m2} (ref >59)
GFR calc non Af Amer: 78 mL/min/{1.73_m2} (ref >59)
Globulin, Total: 2.1 g/dL (ref 1.5–4.5)
Glucose: 81 mg/dL (ref 65–99)
Potassium: 4.1 mmol/L (ref 3.5–5.2)
Sodium: 139 mmol/L (ref 134–144)
Total Protein: 6.8 g/dL (ref 6.0–8.5)

## 2020-03-22 LAB — B12 AND FOLATE PANEL
Folate: >20 ng/mL (ref >3.0)
Vitamin B-12: 518 pg/mL (ref 232–1245)

## 2020-03-22 LAB — T4 AND TSH
T4, Total: 7.1 ug/dL (ref 4.5–12.0)
TSH: 1.13 u[IU]/mL (ref 0.450–4.500)

## 2020-03-22 NOTE — Telephone Encounter (Signed)
-----   Message from Mar Daring, [place] sent at 03/22/2020  8:17 AM EDT ----- All labs are completely normal. Would you wish to proceed with MRI or Neurology referral or do both at same time?

## 2020-03-22 NOTE — Telephone Encounter (Signed)
Reviewed lab results and physician's note with the patient. She would like to go ahead with Neurology referral and the MRI, please. Routing to clinic for those orders.

## 2020-03-22 NOTE — Telephone Encounter (Signed)
Patient answered called and said "can I call you back" If patient calls back [facility] for [facility] nurse to give results.

## 2020-03-22 NOTE — Telephone Encounter (Signed)
Referral placed and MRI ordered

## 2020-03-23 ENCOUNTER — Telehealth: Payer: Self-pay | Admitting: Pulmonary Disease

## 2020-03-23 NOTE — Telephone Encounter (Signed)
Patient called and recommendations from Dr. Patsey Berthold relayed. Patient will call her primary care physician tomorrow for follow up.

## 2020-03-23 NOTE — Telephone Encounter (Signed)
Patient requesting prescription for diflucan, please advise.

## 2020-03-23 NOTE — Telephone Encounter (Signed)
Patient reports white covering on tongue and teeth for two days, also vaginal itching for three days with burning. Please advise.

## 2020-03-23 NOTE — Telephone Encounter (Signed)
As her symptoms are more than just around the mouth she needs to discuss with her primary care physician.

## 2020-03-23 NOTE — Telephone Encounter (Signed)
What are her  symptoms?  

## 2020-03-28 ENCOUNTER — Telehealth: Payer: Self-pay

## 2020-03-28 DIAGNOSIS — J0140 Acute pansinusitis, unspecified: Secondary | ICD-10-CM

## 2020-03-28 NOTE — Telephone Encounter (Signed)
Copied from [place] [id number]. Topic: General - Other >> Mar 28, 2020  2:23 PM Rainey Pines A wrote: Patient stated that she was recently seen and would like Burnette to send in medication for her sinus infection and would like to speak with [facility] nurse. Please advise

## 2020-03-30 MED ORDER — AMOXICILLIN-POT CLAVULANATE 875-125 MG PO TABS: 1.0000 | ORAL_TABLET | Freq: Two times a day (BID) | ORAL | 0 refills | Status: DC

## 2020-03-30 NOTE — Telephone Encounter (Signed)
Patient advised that medication has been sent in.

## 2020-03-30 NOTE — Telephone Encounter (Signed)
Augmentin sent for sinusitis

## 2020-04-05 ENCOUNTER — Other Ambulatory Visit: Payer: Self-pay

## 2020-04-05 ENCOUNTER — Ambulatory Visit
Admission: RE | Admit: 2020-04-05 | Discharge: 2020-04-05 | Disposition: A | Discharge status: Home / Self Care | Payer: Medicare Other | Source: Ambulatory Visit | Attending: Physician Assistant | Admitting: Physician Assistant

## 2020-04-05 DIAGNOSIS — R251 Tremor, unspecified: Secondary | ICD-10-CM | POA: Insufficient documentation

## 2020-04-05 DIAGNOSIS — Z820 Family history of epilepsy and other diseases of the nervous system: Secondary | ICD-10-CM | POA: Insufficient documentation

## 2020-04-05 MED ORDER — GADOBUTROL 1 MMOL/ML IV SOLN
7.0000 mL | Freq: Once | INTRAVENOUS | Status: AC | PRN
  Administered 2020-04-05: 6 mL via INTRAVENOUS

## 2020-04-06 ENCOUNTER — Telehealth: Payer: Self-pay

## 2020-04-06 NOTE — Telephone Encounter (Signed)
Patient advised as directed below. 

## 2020-04-06 NOTE — Telephone Encounter (Signed)
-----   Message from Mar Daring, PA-C sent at 04/06/2020  8:47 AM EDT ----- Brain MRI is completely normal.

## 2020-04-21 ENCOUNTER — Other Ambulatory Visit: Payer: Self-pay

## 2020-04-21 ENCOUNTER — Ambulatory Visit (INDEPENDENT_AMBULATORY_CARE_PROVIDER_SITE_OTHER): Payer: Medicare Other | Admitting: Physician Assistant

## 2020-04-21 DIAGNOSIS — N952 Postmenopausal atrophic vaginitis: Secondary | ICD-10-CM | POA: Diagnosis not present

## 2020-04-21 DIAGNOSIS — M2540 Effusion, unspecified joint: Secondary | ICD-10-CM

## 2020-04-21 NOTE — Progress Notes (Signed)
Established patient visit   Patient: Kimberly Mercer   DOB: 1946/03/02   74 y.o. Female  MRN: [id number] Visit Date: 04/21/2020  Today's healthcare provider: Mar Daring, PA-C   Chief Complaint  Patient presents with  . Inflammatory Bowel Disease   Subjective    HPI  Patient here to discuss changes in her body. Reports that she had some inflammation her body that she thought it was a sinus infection. Reports that her joints were so swollen that she couldn't even bend her knees at all last Friday. She reports that by the time she got the medications by Monday she didn't take them because the inflammation and joint pain was gone.  She reports that she start it back on the estriol cream.    She reports that she also wants to discuss the neurology appointment.  Patient Active Problem List   Diagnosis Date Noted  . Syncope 04/30/2018  . Chronic midline low back pain 03/31/2018  . Primary osteoarthritis of both first carpometacarpal joints 12/18/2015  . Thyroid disease 01/03/2014  . Tachycardia 01/03/2014  . Palpitation 01/03/2014  . Menopause 07/06/2012  . Asthma 07/06/2012  . GERD (gastroesophageal reflux disease) 07/06/2012  . Allergic rhinitis 07/06/2012  . Varicose veins 07/06/2012   Past Medical History:  Diagnosis Date  . Allergic rhinitis, cause unspecified   . Allergy   . Asthma   . Benign neoplasm of colon   . Cancer ([place])   . Candidiasis of the esophagus   . Chicken pox   . Diverticulitis of colon (without mention of hemorrhage)(562.11)   . E coli enteritis   . GERD (gastroesophageal reflux disease)   . Hemorrhoids    internal  . Measles   . Osteoarthrosis, unspecified whether generalized or localized, unspecified site   . Symptomatic menopausal or female climacteric states   . Unspecified asthma(493.90)   . Unspecified essential hypertension   . Unspecified hypothyroidism        Medications: Outpatient Medications Prior to Visit    Medication Sig  . acetaminophen (TYLENOL 8 HOUR) 650 MG CR tablet Take 1,300 mg by mouth as needed for pain.  Marland Kitchen albuterol (PROVENTIL) (2.5 MG/3ML) 0.083% nebulizer solution Take 3 mLs (2.5 mg total) by nebulization every 6 (six) hours as needed for wheezing or shortness of breath. Dx:J45.909  . albuterol (VENTOLIN HFA) 108 (90 Base) MCG/ACT inhaler TAKE 2 PUFFS BY MOUTH EVERY 6 HOURS AS NEEDED FOR WHEEZE OR SHORTNESS OF BREATH  . aspirin 81 MG tablet Take 81 mg by mouth daily.  Marland Kitchen CALCIUM-MAGNESUIUM-ZINC 333-133-8.3 MG TABS Take 3 tablets by mouth daily.  Marland Kitchen CANNABIDIOL PO Take 30 mg by mouth daily. Powder  . Cholecalciferol (VITAMIN D3) 2000 units TABS Take 2,000 Units by mouth daily.   . diclofenac sodium (VOLTAREN) 1 % GEL Apply 2 g topically 4 (four) times daily as needed.  . Famotidine (PEPCID PO) Take 20 mg by mouth as needed.   Marland Kitchen FLOVENT HFA 220 MCG/ACT inhaler TAKE 1 PUFF BY MOUTH TWICE A DAY *COVERED $52.09*  . fluticasone (FLONASE) 50 MCG/ACT nasal spray SPRAY 2 SPRAYS INTO EACH NOSTRIL EVERY DAY  . levothyroxine (SYNTHROID) 75 MCG tablet TAKE 1 TABLET BY MOUTH EVERY DAY ON AN EMPTY STOMACH  . montelukast (SINGULAIR) 10 MG tablet TAKE 1 TABLET BY MOUTH IN THE EVENING  . Multiple Vitamin (MULTIVITAMIN) tablet Take 1 tablet by mouth daily.  . mupirocin ointment (BACTROBAN) 2 % Apply topically 2 (two) times daily. (Patient  taking differently: Apply topically 2 (two) times daily. As needed)  . Spacer/Aero-Holding Chambers (AEROCHAMBER MV) inhaler Use as instructed  . vitamin E (VITAMIN E) 400 UNIT capsule Take 800 Units by mouth daily.   Marland Kitchen amoxicillin-clavulanate (AUGMENTIN) 875-125 MG tablet Take 1 tablet by mouth 2 (two) times daily. (Patient not taking: Reported on 04/21/2020)  . fluticasone (FLOVENT HFA) 220 MCG/ACT inhaler Inhale 1 puff into the lungs 2 (two) times daily.  Marland Kitchen ipratropium (ATROVENT) 0.02 % nebulizer solution Take 1.25 mLs (0.25 mg total) by nebulization every 4 (four)  hours as needed for wheezing or shortness of breath.   No facility-administered medications prior to visit.    Review of Systems  Constitutional: Positive for fatigue.  Respiratory: Negative.   Cardiovascular: Negative.   Gastrointestinal: Positive for abdominal pain.  Musculoskeletal: Positive for arthralgias, joint swelling and myalgias.  Neurological: Negative.     Last CBC Lab Results  Component Value Date   WBC 4.7 03/21/2020   HGB 15.0 03/21/2020   HCT 44.4 03/21/2020   MCV 95 03/21/2020   MCH 31.9 03/21/2020   RDW 12.1 03/21/2020   PLT 266 36/62/9476   Last metabolic panel Lab Results  Component Value Date   GLUCOSE 81 03/21/2020   NA 139 03/21/2020   K 4.1 03/21/2020   CL 101 03/21/2020   CO2 22 03/21/2020   BUN 24 03/21/2020   CREATININE 0.77 03/21/2020   GFRNONAA 78 03/21/2020   GFRAA 90 03/21/2020   CALCIUM 9.8 03/21/2020   PROT 6.8 03/21/2020   ALBUMIN 4.7 03/21/2020   LABGLOB 2.1 03/21/2020   AGRATIO 2.2 03/21/2020   BILITOT 0.2 03/21/2020   ALKPHOS 72 03/21/2020   AST 25 03/21/2020   ALT 19 03/21/2020   ANIONGAP 10 05/01/2018      Objective    There were no vitals taken for this visit. BP Readings from Last 3 Encounters:  03/21/20 108/72  09/06/19 128/72  08/17/19 120/78   Wt Readings from Last 3 Encounters:  03/21/20 134 lb (60.8 kg)  09/06/19 140 lb (63.5 kg)  08/17/19 139 lb 3.2 oz (63.1 kg)      Physical Exam Vitals reviewed.  Constitutional:      Appearance: Normal appearance. She is well-developed and normal weight.  HENT:     Head: Normocephalic and atraumatic.  Pulmonary:     Effort: Pulmonary effort is normal. No respiratory distress.  Musculoskeletal:     Cervical back: Normal range of motion and neck supple.  Neurological:     Mental Status: She is alert.  Psychiatric:        Mood and Affect: Mood normal.        Behavior: Behavior normal.        Thought Content: Thought content normal.        Judgment: Judgment  normal.       No results found for any visits on 04/21/20.  Assessment & Plan     1. Vaginal atrophy Will restart the estriol cream for her to start with every other day use and decrease use as appropriate.  2. Joint swelling Does work outside and around [employer]. Does have h/o tick bites. Will check lyme antibodies as below. I will f/u pending results.  - B. burgdorfi antibodies   No follow-ups on file.      Reynolds Bowl, PA-C, have reviewed all documentation for this visit. The documentation on 04/30/20 for the exam, diagnosis, procedures, and orders are all accurate and complete.  Rubye Beach  [facility] [phone] (phone) [phone] (fax)  [place]

## 2020-04-27 LAB — B. BURGDORFI ANTIBODIES: Lyme IgG/IgM Ab: <0.91 {ISR} (ref 0.00–0.90)

## 2020-04-30 ENCOUNTER — Encounter: Payer: Self-pay | Admitting: Physician Assistant

## 2020-05-08 ENCOUNTER — Other Ambulatory Visit: Payer: Self-pay | Admitting: Physician Assistant

## 2020-05-08 NOTE — Telephone Encounter (Signed)
Requested Prescriptions  Pending Prescriptions Disp Refills  . montelukast (SINGULAIR) 10 MG tablet [Pharmacy Med Name: MONTELUKAST SOD 10 MG TABLET] 90 tablet 3    Sig: TAKE 1 TABLET BY MOUTH EVERY DAY IN THE EVENING     Pulmonology:  Leukotriene Inhibitors Passed - 05/08/2020 10:07 AM      Passed - Valid encounter within last 12 months    Recent Outpatient Visits          2 weeks ago Vaginal atrophy   [place], [name], [place]   1 month ago Thyroid disease   [facility] F[place] Malling M, [place]   9 months ago Thyroid disease   [place], [place]   10 months ago Pulmonary emphysema, unspecified emphysema type [facility])   [place], [name], [place]   1 year ago Shortness of breath   [place], [place], [place]

## 2020-06-30 ENCOUNTER — Other Ambulatory Visit: Payer: Self-pay | Admitting: Physician Assistant

## 2020-06-30 DIAGNOSIS — K219 Gastro-esophageal reflux disease without esophagitis: Secondary | ICD-10-CM

## 2020-07-07 ENCOUNTER — Telehealth: Payer: Self-pay | Admitting: Physician Assistant

## 2020-07-07 DIAGNOSIS — K219 Gastro-esophageal reflux disease without esophagitis: Secondary | ICD-10-CM

## 2020-07-07 MED ORDER — PANTOPRAZOLE SODIUM 40 MG PO TBEC: 40.0000 mg | DELAYED_RELEASE_TABLET | Freq: Every day | ORAL | 1 refills | Status: DC

## 2020-07-07 NOTE — Telephone Encounter (Signed)
Patient is requesting a change in reflux medication- she states she sometimes gets a flare in her reflux symptoms and the Pepcid does not help- she would like to treat reflux flare with pantoprazole and when it has calmed down - she can switch back to the pepcid. Patient does not want to contact GI for this one Rx and she is wanting to know if J Burnette,PA  can Rx this. Told patient would send request for review and office would let her know.

## 2020-07-07 NOTE — Telephone Encounter (Signed)
Medication Refill - Medication: Pantoprazole   Has the patient contacted their pharmacy? Yes.   Pt states that the pepcid is not helping and she is no longer seeing this provider. Please advise.  (Agent: If no, request that the patient contact the pharmacy for the refill.) (Agent: If yes, when and what did the pharmacy advise?)  Preferred Pharmacy (with phone number or street name): [id number] - [place], [place]  [address] [place] [address]  Phone: [phone] Fax: [phone]  Hours: Not open 24 hours     Agent: Please be advised that RX refills may take up to 3 business days. We ask that you follow-up with your pharmacy.

## 2020-07-07 NOTE — Telephone Encounter (Signed)
Pantoprazole sent in to [facility]

## 2020-07-07 NOTE — Telephone Encounter (Signed)
Patient advised as directed. °

## 2020-07-11 ENCOUNTER — Other Ambulatory Visit: Payer: Self-pay | Admitting: Pulmonary Disease

## 2020-07-17 ENCOUNTER — Telehealth: Payer: Self-pay | Admitting: Physician Assistant

## 2020-07-17 NOTE — Telephone Encounter (Signed)
Copied from [place] [id number]. Topic: Medicare AWV >> Jul 17, 2020  2:15 PM Cher Nakai R wrote: Reason for CRM: Left message for patient to call back and schedule Medicare Annual Wellness Visit (AWV) either virtually or in office.  Last AWV 07/14/2019  Please schedule at anytime with [facility] Health Advisor.  If any questions, please contact me at [phone]

## 2020-07-25 DIAGNOSIS — Z23 Encounter for immunization: Secondary | ICD-10-CM | POA: Diagnosis not present

## 2020-08-20 ENCOUNTER — Other Ambulatory Visit: Payer: Self-pay

## 2020-08-20 ENCOUNTER — Emergency Department: Payer: Medicare Other

## 2020-08-20 ENCOUNTER — Emergency Department
Admission: EM | Admit: 2020-08-20 | Discharge: 2020-08-20 | Disposition: A | Discharge status: Home / Self Care | Payer: Medicare Other | Attending: Emergency Medicine | Admitting: Emergency Medicine

## 2020-08-20 DIAGNOSIS — Z7982 Long term (current) use of aspirin: Secondary | ICD-10-CM | POA: Insufficient documentation

## 2020-08-20 DIAGNOSIS — Z20822 Contact with and (suspected) exposure to covid-19: Secondary | ICD-10-CM | POA: Diagnosis not present

## 2020-08-20 DIAGNOSIS — E039 Hypothyroidism, unspecified: Secondary | ICD-10-CM | POA: Diagnosis not present

## 2020-08-20 DIAGNOSIS — J45909 Unspecified asthma, uncomplicated: Secondary | ICD-10-CM | POA: Diagnosis not present

## 2020-08-20 DIAGNOSIS — Z79899 Other long term (current) drug therapy: Secondary | ICD-10-CM | POA: Diagnosis not present

## 2020-08-20 DIAGNOSIS — R0789 Other chest pain: Secondary | ICD-10-CM | POA: Insufficient documentation

## 2020-08-20 DIAGNOSIS — Z86010 Personal history of colonic polyps: Secondary | ICD-10-CM | POA: Insufficient documentation

## 2020-08-20 DIAGNOSIS — I10 Essential (primary) hypertension: Secondary | ICD-10-CM | POA: Diagnosis not present

## 2020-08-20 DIAGNOSIS — R079 Chest pain, unspecified: Secondary | ICD-10-CM

## 2020-08-20 LAB — LIPASE, BLOOD: Lipase: 35 U/L (ref 11–51)

## 2020-08-20 LAB — CBC WITH DIFFERENTIAL/PLATELET
Abs Immature Granulocytes: 0.01 10*3/uL (ref 0.00–0.07)
Basophils Absolute: 0.1 10*3/uL (ref 0.0–0.1)
Basophils Relative: 1 %
Eosinophils Absolute: 0.1 10*3/uL (ref 0.0–0.5)
Eosinophils Relative: 2 %
HCT: 42.5 % (ref 36.0–46.0)
Hemoglobin: 15.1 g/dL — ABNORMAL HIGH (ref 12.0–15.0)
Immature Granulocytes: 0 %
Lymphocytes Relative: 43 %
Lymphs Abs: 2.2 10*3/uL (ref 0.7–4.0)
MCH: 32.6 pg (ref 26.0–34.0)
MCHC: 35.5 g/dL (ref 30.0–36.0)
MCV: 91.8 fL (ref 80.0–100.0)
Monocytes Absolute: 0.6 10*3/uL (ref 0.1–1.0)
Monocytes Relative: 11 %
Neutro Abs: 2.2 10*3/uL (ref 1.7–7.7)
Neutrophils Relative %: 43 %
Platelets: 291 10*3/uL (ref 150–400)
RBC: 4.63 MIL/uL (ref 3.87–5.11)
RDW: 12 % (ref 11.5–15.5)
WBC: 5.1 10*3/uL (ref 4.0–10.5)
nRBC: 0 % (ref 0.0–0.2)

## 2020-08-20 LAB — COMPREHENSIVE METABOLIC PANEL
ALT: 29 U/L (ref 0–44)
AST: 33 U/L (ref 15–41)
Albumin: 4.7 g/dL (ref 3.5–5.0)
Alkaline Phosphatase: 77 U/L (ref 38–126)
Anion gap: 13 (ref 5–15)
BUN: 32 mg/dL — ABNORMAL HIGH (ref 8–23)
CO2: 25 mmol/L (ref 22–32)
Calcium: 10 mg/dL (ref 8.9–10.3)
Chloride: 99 mmol/L (ref 98–111)
Creatinine, Ser: 0.94 mg/dL (ref 0.44–1.00)
GFR, Estimated: >60 mL/min (ref >60)
Glucose, Bld: 115 mg/dL — ABNORMAL HIGH (ref 70–99)
Potassium: 3.6 mmol/L (ref 3.5–5.1)
Sodium: 137 mmol/L (ref 135–145)
Total Bilirubin: 0.6 mg/dL (ref 0.3–1.2)
Total Protein: 7.4 g/dL (ref 6.5–8.1)

## 2020-08-20 LAB — FIBRIN DERIVATIVES D-DIMER (ARMC ONLY): Fibrin derivatives D-dimer (ARMC): 326.47 ng/mL (FEU) (ref 0.00–499.00)

## 2020-08-20 LAB — TROPONIN I (HIGH SENSITIVITY)
Troponin I (High Sensitivity): 5 ng/L (ref <18)
Troponin I (High Sensitivity): 4 ng/L (ref <18)

## 2020-08-20 LAB — RESPIRATORY PANEL BY RT PCR (FLU A&B, COVID)
Influenza A by PCR: NEGATIVE
Influenza B by PCR: NEGATIVE
SARS Coronavirus 2 by RT PCR: NEGATIVE

## 2020-08-20 MED ORDER — ASPIRIN 81 MG PO CHEW
81.0000 mg | CHEWABLE_TABLET | Freq: Once | ORAL | Status: AC
  Administered 2020-08-20: 81 mg via ORAL
  Filled 2020-08-20: qty 1

## 2020-08-20 MED ORDER — METOPROLOL SUCCINATE ER 50 MG PO TB24
25.0000 mg | ORAL_TABLET | Freq: Every day | ORAL | Status: DC
  Administered 2020-08-20: 25 mg via ORAL
  Filled 2020-08-20: qty 1

## 2020-08-20 MED ORDER — METOPROLOL SUCCINATE ER 50 MG PO TB24: 25.0000 mg | ORAL_TABLET | Freq: Every day | ORAL | Status: DC

## 2020-08-20 MED ORDER — ISOSORBIDE MONONITRATE ER 60 MG PO TB24: 30.0000 mg | ORAL_TABLET | Freq: Every day | ORAL | Status: DC

## 2020-08-20 MED ORDER — ISOSORBIDE MONONITRATE ER 60 MG PO TB24
30.0000 mg | ORAL_TABLET | Freq: Every day | ORAL | Status: DC
  Administered 2020-08-20: 30 mg via ORAL
  Filled 2020-08-20: qty 1

## 2020-08-20 NOTE — ED Provider Notes (Signed)
[facility] Emergency Department Provider Note   ____________________________________________   First MD Initiated Contact with Patient 08/20/20 2053     (approximate)  I have reviewed the triage vital signs and the nursing notes.   HISTORY  Chief Complaint Shortness of Breath    HPI Kimberly Mercer is a 74 y.o. female who reports she was at a friend's house and started going up the stairs and developed shortness of breath chest pounding wooziness to where she could not go up the stairs straight and then got chest tightness.  She felt like maybe her asthma was starting up so she used her inhaler and her nebulizer but it really did not help.  With some rest chest tightness got better and then she went out to her barn and started to do some work and developed the same symptoms again with pain in the chest and tightness and radiation to between the shoulder blades and up into the neck.  Also she had some belly hurting and crampy and colicky pain.  She is now back to normal again but is worried about getting up and doing anything because she is afraid that the activity will bring on the symptoms again.         Past Medical History:  Diagnosis Date  . Allergic rhinitis, cause unspecified   . Allergy   . Asthma   . Benign neoplasm of colon   . Cancer ([place])   . Candidiasis of the esophagus   . Chicken pox   . Diverticulitis of colon (without mention of hemorrhage)(562.11)   . E coli enteritis   . GERD (gastroesophageal reflux disease)   . Hemorrhoids    internal  . Measles   . Osteoarthrosis, unspecified whether generalized or localized, unspecified site   . Symptomatic menopausal or female climacteric states   . Unspecified asthma(493.90)   . Unspecified essential hypertension   . Unspecified hypothyroidism     Patient Active Problem List   Diagnosis Date Noted  . Syncope 04/30/2018  . Chronic midline low back pain 03/31/2018  . Primary  osteoarthritis of both first carpometacarpal joints 12/18/2015  . Thyroid disease 01/03/2014  . Tachycardia 01/03/2014  . Palpitation 01/03/2014  . Menopause 07/06/2012  . Asthma 07/06/2012  . GERD (gastroesophageal reflux disease) 07/06/2012  . Allergic rhinitis 07/06/2012  . Varicose veins 07/06/2012    Past Surgical History:  Procedure Laterality Date  . cardiac catherization  12/09/2008   normal coranaries,normal EF.  . dilatation and curettage     due to SAB x 2  . thyroid nodule  1990   benign  . TOTAL ABDOMINAL HYSTERECTOMY W/ BILATERAL SALPINGOOPHORECTOMY  2003   complex endometrial hyperplasia, DUB, fibroids  . WISDOM TOOTH EXTRACTION      Prior to Admission medications   Medication Sig Start Date End Date Taking? Authorizing Provider  acetaminophen (TYLENOL 8 HOUR) 650 MG CR tablet Take 1,300 mg by mouth as needed for pain.    [provider]  albuterol (PROVENTIL) (2.5 MG/3ML) 0.083% nebulizer solution Take 3 mLs (2.5 mg total) by nebulization every 6 (six) hours as needed for wheezing or shortness of breath. Dx:J45.909 09/21/19   Tyler Pita, MD  albuterol (VENTOLIN HFA) 108 (90 Base) MCG/ACT inhaler TAKE 2 PUFFS BY MOUTH EVERY 6 HOURS AS NEEDED FOR WHEEZE OR SHORTNESS OF BREATH 10/08/19   Mar Daring, PA-C  aspirin 81 MG tablet Take 81 mg by mouth daily.    [provider]  CALCIUM-MAGNESUIUM-ZINC 333-133-8.3 MG TABS Take 3 tablets by mouth daily.    [provider]  CANNABIDIOL PO Take 30 mg by mouth daily. Powder    [provider]  Cholecalciferol (VITAMIN D3) 2000 units TABS Take 2,000 Units by mouth daily.     [provider]  diclofenac sodium (VOLTAREN) 1 % GEL Apply 2 g topically 4 (four) times daily as needed. 03/31/18   [provider]  Famotidine (PEPCID PO) Take 20 mg by mouth as needed.     [provider]  FLOVENT HFA 220 MCG/ACT inhaler TAKE 1 PUFF BY MOUTH TWICE A DAY 07/11/20    Tyler Pita, MD  fluticasone [facility]) 50 MCG/ACT nasal spray SPRAY 2 SPRAYS INTO EACH NOSTRIL EVERY DAY 03/14/20   Mar Daring, PA-C  levothyroxine (SYNTHROID) 75 MCG tablet TAKE 1 TABLET BY MOUTH EVERY DAY ON AN EMPTY STOMACH 06/30/20   Mar Daring, PA-C  montelukast (SINGULAIR) 10 MG tablet TAKE 1 TABLET BY MOUTH EVERY DAY IN THE EVENING 05/08/20   Mar Daring, PA-C  Multiple Vitamin (MULTIVITAMIN) tablet Take 1 tablet by mouth daily.    [provider]  mupirocin ointment (BACTROBAN) 2 % Apply topically 2 (two) times daily. Patient taking differently: Apply topically 2 (two) times daily. As needed 10/02/18   Mar Daring, PA-C  pantoprazole (PROTONIX) 40 MG tablet Take 1 tablet (40 mg total) by mouth daily. 07/07/20   Mar Daring, PA-C  Spacer/Aero-Holding Chambers (AEROCHAMBER MV) inhaler Use as instructed 09/06/19   Tyler Pita, MD  vitamin E (VITAMIN E) 400 UNIT capsule Take 800 Units by mouth daily.     [provider]    Allergies Claritin [loratadine], Fructose, Gluten meal, Ipratropium, Lactase, Levaquin [levofloxacin], and Sulfa drugs cross reactors  Family History  Problem Relation Age of Onset  . Heart disease Mother        CAD  . Stroke Mother        x 2  . Mental illness Mother        not diagnosed  . Irritable bowel syndrome Mother   . Heart disease Father        CAD  . Parkinsonism Father   . Cancer Sister        Breast  . Cancer Other        Breast and Ovarian  . Heart disease Brother   . Schizophrenia Sister   . Lung disease Sister     Social History Social History   Tobacco Use  . Smoking status: Never Smoker  . Smokeless tobacco: Never Used  Vaping Use  . Vaping Use: Never used  Substance Use Topics  . Alcohol use: No  . Drug use: No    Review of Systems  Constitutional: No fever/chills Eyes: No visual changes. ENT: No sore throat. Cardiovascular: Earlier today chest  pain. Respiratory: Earlier today shortness of breath. Gastrointestinal: Some abdominal pain.   nausea, no vomiting.  No diarrhea.  No constipation. Genitourinary: Negative for dysuria. Musculoskeletal: Negative for back pain. Skin: Negative for rash. Neurological: Negative for headaches, focal weakness   ____________________________________________   PHYSICAL EXAM:  VITAL SIGNS: ED Triage Vitals  Enc Vitals Group     BP 08/20/20 1858 99/85     Pulse Rate 08/20/20 1857 98     Resp 08/20/20 1857 (!) 22     Temp 08/20/20 1858 98.3 F (36.8 C)     Temp Source 08/20/20 1857 Oral  SpO2 08/20/20 1857 100 %     Weight 08/20/20 1859 132 lb (59.9 kg)     Height 08/20/20 1859 5\' 7"  (1.702 m)     Head Circumference --      Peak Flow --      Pain Score 08/20/20 1859 2     Pain Loc --      Pain Edu? --      Excl. in [place]? --     Constitutional: Alert and oriented. Well appearing and in no acute distress. Eyes: Conjunctivae are normal. PER. Head: Atraumatic. Nose: No congestion/rhinnorhea. Mouth/Throat: Mucous membranes are moist.  Oropharynx non-erythematous. Neck: No stridor.  Cardiovascular: Normal rate, regular rhythm. Grossly normal heart sounds.  Good peripheral circulation. Respiratory: Normal respiratory effort.  No retractions. Lungs CTAB. Gastrointestinal: Soft and patient reports diffuse discomfort on palpation no distention. No abdominal bruits.  Musculoskeletal: No lower extremity tenderness some bilateral edema.  Neurologic:  Normal speech and language. No gross focal neurologic deficits are appreciated. . Skin:  Skin is warm, dry and intact. No rash noted.   ____________________________________________   LABS (all labs ordered are listed, but only abnormal results are displayed)  Labs Reviewed  COMPREHENSIVE METABOLIC PANEL - Abnormal; Notable for the following components:      Result Value   Glucose, Bld 115 (*)    BUN 32 (*)    All other components within  normal limits  CBC WITH DIFFERENTIAL/PLATELET - Abnormal; Notable for the following components:   Hemoglobin 15.1 (*)    All other components within normal limits  RESPIRATORY PANEL BY RT PCR (FLU A&B, COVID)  FIBRIN DERIVATIVES D-DIMER ([facility] ONLY)  LIPASE, BLOOD  CBC WITH DIFFERENTIAL/PLATELET  TROPONIN I (HIGH SENSITIVITY)  TROPONIN I (HIGH SENSITIVITY)   ____________________________________________  EKG  EKG #1 read interpreted by me shows normal sinus rhythm rate of 80 normal axis no acute ST-T wave changes EKG #2 read interpreted by me shows normal sinus rhythm rate of 72 normal axis no acute ST-T wave changes essentially similar to #1 ____________________________________________  RADIOLOGY Gertha Calkin, personally viewed and evaluated these images (plain radiographs) as part of my medical decision making, as well as reviewing the written report by the radiologist.  ED MD interpretation: Chest x-ray read by radiology reviewed by me shows no acute pathology  Official radiology report(s): DG Chest 2 View  Result Date: 08/20/2020 CLINICAL DATA:  Asthma, anxiety, chest tightness EXAM: CHEST - 2 VIEW COMPARISON:  11/09/2018 FINDINGS: Lungs are well expanded, symmetric, and clear. No pneumothorax or pleural effusion. Cardiac size within normal limits. Pulmonary vascularity is normal. Osseous structures are age-appropriate. No acute bone abnormality. IMPRESSION: No active cardiopulmonary disease. Electronically Signed   By: Fidela Salisbury MD   On: 08/20/2020 19:54    ____________________________________________   PROCEDURES  Procedure(s) performed (including Critical Care):  Procedures   ____________________________________________   INITIAL IMPRESSION / ASSESSMENT AND PLAN / ED COURSE  Patient with negative troponin normal EKG negative D-dimer but chest pain and tightness on exertion which is recurrent and new.  There is the possibility that this could be new onset  angina.    I discussed the patient in detail with Dr. Clayborn Bigness who knows her.  He offered her the alternative of coming in the hospital tonight or taking Imdur metoprolol and aspirin by mouth now and seeing him in the office in the morning with the caveat that if they did that and had any further symptoms he would call  EMS and return here immediately.  Family chose to go home and see him in the morning.  They know him very well and are comfortable with that.  I am also comfortable with that.         ____________________________________________   FINAL CLINICAL IMPRESSION(S) / ED DIAGNOSES  Final diagnoses:  Chest pain, unspecified type     ED Discharge Orders    None      *Please note:  Kimberly Mercer was evaluated in Emergency Department on 08/20/2020 for the symptoms described in the history of present illness. She was evaluated in the context of the global COVID-19 pandemic, which necessitated consideration that the patient might be at risk for infection with the SARS-CoV-2 virus that causes COVID-19. Institutional protocols and algorithms that pertain to the evaluation of patients at risk for COVID-19 are in a state of rapid change based on information released by regulatory bodies including the CDC and federal and state organizations. These policies and algorithms were followed during the patient's care in the ED.  Some ED evaluations and interventions may be delayed as a result of limited staffing during and the pandemic.*   Note:  This document was prepared using Dragon voice recognition software and may include unintentional dictation errors.    Nena Polio, MD 08/20/20 2325

## 2020-08-20 NOTE — Discharge Instructions (Addendum)
Please return for any further episodes of chest pain.  Please give Dr. Etta Quill office a call in the morning and let them know that he wanted to see you first thing in the morning.  He should go to get you in quickly.

## 2020-08-20 NOTE — ED Notes (Signed)
Husband now at bedside. Pt reports that episode of cp prior to arrival was "tight" and radiated to bilateral neck and between scapulas.

## 2020-08-20 NOTE — ED Triage Notes (Signed)
Pt states she feesl shob, dizzy, feels like she has a b and around her chest for one hour. Pt with history of asthma. Pt is nauseated, anxious.

## 2020-08-20 NOTE — ED Notes (Signed)
Assumed care of pt upon being roomed, states starting at 1500 episodes of sob, chest pounding, and dizziness occurred. Denies recent head injury or previous episodes. Used inhaler and nebulizer without relief. Reports "I felt like I was moving like I was on a boat." PT ambulated from toilet to stretcher independently with steady gait. AO x4, talking in full sentences with regular and unlabored breathing. Provided with blanket for comfort, call bell within reach.

## 2020-08-20 NOTE — ED Triage Notes (Signed)
Pt took asa 81mg  x2 and albuterol. Skin pwd, speaking rapidly in full sentences.

## 2020-08-21 ENCOUNTER — Other Ambulatory Visit
Admission: RE | Admit: 2020-08-21 | Discharge: 2020-08-21 | Disposition: A | Discharge status: Home / Self Care | Payer: Medicare Other | Source: Ambulatory Visit | Attending: Internal Medicine | Admitting: Internal Medicine

## 2020-08-21 DIAGNOSIS — R0602 Shortness of breath: Secondary | ICD-10-CM | POA: Insufficient documentation

## 2020-08-21 DIAGNOSIS — R000 Tachycardia, unspecified: Secondary | ICD-10-CM | POA: Diagnosis not present

## 2020-08-21 DIAGNOSIS — K219 Gastro-esophageal reflux disease without esophagitis: Secondary | ICD-10-CM | POA: Diagnosis not present

## 2020-08-21 DIAGNOSIS — I200 Unstable angina: Secondary | ICD-10-CM | POA: Diagnosis not present

## 2020-08-21 DIAGNOSIS — R002 Palpitations: Secondary | ICD-10-CM | POA: Insufficient documentation

## 2020-08-21 DIAGNOSIS — Z01818 Encounter for other preprocedural examination: Secondary | ICD-10-CM | POA: Insufficient documentation

## 2020-08-21 DIAGNOSIS — R079 Chest pain, unspecified: Secondary | ICD-10-CM | POA: Diagnosis not present

## 2020-08-21 LAB — BRAIN NATRIURETIC PEPTIDE: B Natriuretic Peptide: 24.8 pg/mL (ref 0.0–100.0)

## 2020-08-22 ENCOUNTER — Emergency Department: Payer: Medicare Other

## 2020-08-22 ENCOUNTER — Emergency Department
Admission: EM | Admit: 2020-08-22 | Discharge: 2020-08-22 | Disposition: A | Discharge status: Home / Self Care | Payer: Medicare Other | Attending: Emergency Medicine | Admitting: Emergency Medicine

## 2020-08-22 ENCOUNTER — Other Ambulatory Visit: Payer: Self-pay

## 2020-08-22 DIAGNOSIS — J45909 Unspecified asthma, uncomplicated: Secondary | ICD-10-CM | POA: Diagnosis not present

## 2020-08-22 DIAGNOSIS — Z79899 Other long term (current) drug therapy: Secondary | ICD-10-CM | POA: Insufficient documentation

## 2020-08-22 DIAGNOSIS — E039 Hypothyroidism, unspecified: Secondary | ICD-10-CM | POA: Diagnosis not present

## 2020-08-22 DIAGNOSIS — R0602 Shortness of breath: Secondary | ICD-10-CM | POA: Diagnosis not present

## 2020-08-22 DIAGNOSIS — E041 Nontoxic single thyroid nodule: Secondary | ICD-10-CM | POA: Diagnosis not present

## 2020-08-22 DIAGNOSIS — I10 Essential (primary) hypertension: Secondary | ICD-10-CM | POA: Diagnosis not present

## 2020-08-22 DIAGNOSIS — Z85038 Personal history of other malignant neoplasm of large intestine: Secondary | ICD-10-CM | POA: Diagnosis not present

## 2020-08-22 DIAGNOSIS — I672 Cerebral atherosclerosis: Secondary | ICD-10-CM | POA: Diagnosis not present

## 2020-08-22 DIAGNOSIS — I6501 Occlusion and stenosis of right vertebral artery: Secondary | ICD-10-CM | POA: Diagnosis not present

## 2020-08-22 DIAGNOSIS — Z7982 Long term (current) use of aspirin: Secondary | ICD-10-CM | POA: Diagnosis not present

## 2020-08-22 DIAGNOSIS — Z8673 Personal history of transient ischemic attack (TIA), and cerebral infarction without residual deficits: Secondary | ICD-10-CM | POA: Diagnosis not present

## 2020-08-22 DIAGNOSIS — G44209 Tension-type headache, unspecified, not intractable: Secondary | ICD-10-CM | POA: Diagnosis not present

## 2020-08-22 DIAGNOSIS — Z7951 Long term (current) use of inhaled steroids: Secondary | ICD-10-CM | POA: Diagnosis not present

## 2020-08-22 DIAGNOSIS — R079 Chest pain, unspecified: Secondary | ICD-10-CM | POA: Diagnosis not present

## 2020-08-22 DIAGNOSIS — R519 Headache, unspecified: Secondary | ICD-10-CM | POA: Diagnosis not present

## 2020-08-22 LAB — BASIC METABOLIC PANEL
Anion gap: 12 (ref 5–15)
BUN: 29 mg/dL — ABNORMAL HIGH (ref 8–23)
CO2: 26 mmol/L (ref 22–32)
Calcium: 10.3 mg/dL (ref 8.9–10.3)
Chloride: 102 mmol/L (ref 98–111)
Creatinine, Ser: 0.91 mg/dL (ref 0.44–1.00)
GFR, Estimated: >60 mL/min (ref >60)
Glucose, Bld: 109 mg/dL — ABNORMAL HIGH (ref 70–99)
Potassium: 3.9 mmol/L (ref 3.5–5.1)
Sodium: 140 mmol/L (ref 135–145)

## 2020-08-22 LAB — CBC
HCT: 42.4 % (ref 36.0–46.0)
Hemoglobin: 14.8 g/dL (ref 12.0–15.0)
MCH: 32.5 pg (ref 26.0–34.0)
MCHC: 34.9 g/dL (ref 30.0–36.0)
MCV: 93 fL (ref 80.0–100.0)
Platelets: 295 10*3/uL (ref 150–400)
RBC: 4.56 MIL/uL (ref 3.87–5.11)
RDW: 12.2 % (ref 11.5–15.5)
WBC: 5.6 10*3/uL (ref 4.0–10.5)
nRBC: 0 % (ref 0.0–0.2)

## 2020-08-22 LAB — LIPASE, BLOOD: Lipase: 40 U/L (ref 11–51)

## 2020-08-22 LAB — BRAIN NATRIURETIC PEPTIDE: B Natriuretic Peptide: 76.5 pg/mL (ref 0.0–100.0)

## 2020-08-22 LAB — HEPATIC FUNCTION PANEL
ALT: 26 U/L (ref 0–44)
AST: 33 U/L (ref 15–41)
Albumin: 4.6 g/dL (ref 3.5–5.0)
Alkaline Phosphatase: 65 U/L (ref 38–126)
Bilirubin, Direct: <0.1 mg/dL (ref 0.0–0.2)
Total Bilirubin: 0.6 mg/dL (ref 0.3–1.2)
Total Protein: 7.4 g/dL (ref 6.5–8.1)

## 2020-08-22 LAB — FIBRIN DERIVATIVES D-DIMER (ARMC ONLY): Fibrin derivatives D-dimer (ARMC): 331.47 ng/mL (FEU) (ref 0.00–499.00)

## 2020-08-22 LAB — TROPONIN I (HIGH SENSITIVITY)
Troponin I (High Sensitivity): 3 ng/L (ref <18)
Troponin I (High Sensitivity): 5 ng/L (ref <18)

## 2020-08-22 LAB — PROCALCITONIN: Procalcitonin: <0.1 ng/mL

## 2020-08-22 MED ORDER — ALUM & MAG HYDROXIDE-SIMETH 200-200-20 MG/5ML PO SUSP
30.0000 mL | Freq: Once | ORAL | Status: AC
  Administered 2020-08-22: 30 mL via ORAL
  Filled 2020-08-22: qty 30

## 2020-08-22 MED ORDER — IOHEXOL 350 MG/ML SOLN
75.0000 mL | Freq: Once | INTRAVENOUS | Status: AC | PRN
  Administered 2020-08-22: 75 mL via INTRAVENOUS

## 2020-08-22 MED ORDER — PROCHLORPERAZINE EDISYLATE 10 MG/2ML IJ SOLN
10.0000 mg | Freq: Once | INTRAMUSCULAR | Status: AC
  Administered 2020-08-22: 10 mg via INTRAVENOUS
  Filled 2020-08-22: qty 2

## 2020-08-22 MED ORDER — ACETAMINOPHEN 500 MG PO TABS
1000.0000 mg | ORAL_TABLET | Freq: Once | ORAL | Status: AC
  Administered 2020-08-22: 1000 mg via ORAL
  Filled 2020-08-22: qty 2

## 2020-08-22 MED ORDER — NITROGLYCERIN 0.4 MG SL SUBL: 0.4000 mg | SUBLINGUAL_TABLET | SUBLINGUAL | Status: DC | PRN

## 2020-08-22 MED ORDER — LACTATED RINGERS IV BOLUS
1000.0000 mL | Freq: Once | INTRAVENOUS | Status: AC
  Administered 2020-08-22: 1000 mL via INTRAVENOUS

## 2020-08-22 MED ORDER — ACETAMINOPHEN 500 MG PO TABS
1000.0000 mg | ORAL_TABLET | Freq: Once | ORAL | Status: DC
  Filled 2020-08-22: qty 2

## 2020-08-22 NOTE — ED Provider Notes (Addendum)
[facility] Emergency Department Provider Note  ____________________________________________   First MD Initiated Contact with Patient 08/22/20 1451     (approximate)  I have reviewed the triage vital signs and the nursing notes.   HISTORY  Chief Complaint Chest Pain and Shortness of Breath   HPI Kimberly Mercer is a 74 y.o. female with a past medical history of asthma, GERD, and arthritis as well as recent ED visit for chest pain with concern for possible angina with plan for elective outpatient cath on 11/5 who presents for assessment of some substernal chest pressure and shortness of breath that started approximate hour prior to arrival.  Patient states she said it felt like initially she had some reflux and then it felt like an elephant was sitting on her chest but now this is significantly improved.  She states she took 2 baby aspirin and her long-acting nitro as well as her metoprolol.  She also states she has had severe pain in the left side of her neck and back of her head and some present for 3 days and has not disappeared.  She denies any prior similar headaches.  She states her headache started suddenly over seconds while she was at rest 3 days ago.  She has endorses little bit of epigastric pain which she attributes to her reflux or several days.  She states she has been taking ibuprofenfor her arthritis.  She denies any recent falls or traumatic injuries.  She is not otherwise anticoagulated.  She states her headache is slightly improved but is still very bothersome.  She denies any vision changes but does endorse a little bit of dizziness.  She endorses a mild nonproductive cough for the last couple days.  No fevers, chills, vomiting, diarrhea, dysuria, back pain, rash, extremity pain, or other acute complaints.  She denies any significant EtOH use, tobacco abuse, illegal drugs.         Past Medical History:  Diagnosis Date  . Allergic rhinitis, cause  unspecified   . Allergy   . Asthma   . Benign neoplasm of colon   . Cancer ([place])   . Candidiasis of the esophagus   . Chicken pox   . Diverticulitis of colon (without mention of hemorrhage)(562.11)   . E coli enteritis   . GERD (gastroesophageal reflux disease)   . Hemorrhoids    internal  . Measles   . Osteoarthrosis, unspecified whether generalized or localized, unspecified site   . Symptomatic menopausal or female climacteric states   . Unspecified asthma(493.90)   . Unspecified essential hypertension   . Unspecified hypothyroidism     Patient Active Problem List   Diagnosis Date Noted  . Syncope 04/30/2018  . Chronic midline low back pain 03/31/2018  . Primary osteoarthritis of both first carpometacarpal joints 12/18/2015  . Thyroid disease 01/03/2014  . Tachycardia 01/03/2014  . Palpitation 01/03/2014  . Menopause 07/06/2012  . Asthma 07/06/2012  . GERD (gastroesophageal reflux disease) 07/06/2012  . Allergic rhinitis 07/06/2012  . Varicose veins 07/06/2012    Past Surgical History:  Procedure Laterality Date  . cardiac catherization  12/09/2008   normal coranaries,normal EF.  . dilatation and curettage     due to SAB x 2  . thyroid nodule  1990   benign  . TOTAL ABDOMINAL HYSTERECTOMY W/ BILATERAL SALPINGOOPHORECTOMY  2003   complex endometrial hyperplasia, DUB, fibroids  . WISDOM TOOTH EXTRACTION      Prior to Admission medications   Medication Sig  Start Date End Date Taking? Authorizing Provider  acetaminophen (TYLENOL 8 HOUR) 650 MG CR tablet Take 650 mg by mouth every 8 (eight) hours as needed for pain.     [provider]  albuterol (PROVENTIL) (2.5 MG/3ML) 0.083% nebulizer solution Take 3 mLs (2.5 mg total) by nebulization every 6 (six) hours as needed for wheezing or shortness of breath. Dx:J45.909 09/21/19   Tyler Pita, MD  albuterol (VENTOLIN HFA) 108 (90 Base) MCG/ACT inhaler TAKE 2 PUFFS BY MOUTH EVERY 6 HOURS AS NEEDED FOR WHEEZE  OR SHORTNESS OF BREATH 10/08/19   Mar Daring, PA-C  aspirin EC 81 MG tablet Take 81 mg by mouth every evening. Swallow whole.    [provider]  Calcium-Magnesium-Zinc (CAL-MAG-ZINC PO) Take 1 tablet by mouth daily.    [provider]  CANNABIDIOL PO Take 30 mg by mouth daily. Capsule    [provider]  cholecalciferol (VITAMIN D3) 25 MCG (1000 UNIT) tablet Take 1,000 Units by mouth daily.    [provider]  diclofenac sodium (VOLTAREN) 1 % GEL Apply 2 g topically 4 (four) times daily as needed (pain.).  03/31/18   [provider]  diphenhydrAMINE (BENADRYL) 25 MG tablet Take 25 mg by mouth every 6 (six) hours as needed (allergies.).    [provider]  famotidine (PEPCID) 20 MG tablet Take 20 mg by mouth at bedtime as needed (breakthrough heartburn/indigestion).    [provider]  FLOVENT HFA 220 MCG/ACT inhaler TAKE 1 PUFF BY MOUTH TWICE A DAY Patient taking differently: Inhale 1 puff into the lungs in the morning and at bedtime.  07/11/20   Tyler Pita, MD  fluticasone (FLONASE) 50 MCG/ACT nasal spray SPRAY 2 SPRAYS INTO EACH NOSTRIL EVERY DAY Patient taking differently: Place 2 sprays into both nostrils every evening.  03/14/20   Burnette, [name], PA-C  ibuprofen (ADVIL) 200 MG tablet Take 200 mg by mouth every 8 (eight) hours as needed (pain.).    [provider]  isosorbide mononitrate (IMDUR) 30 MG 24 hr tablet Take 30 mg by mouth daily at 12 noon.  08/21/20   [provider]  levothyroxine (SYNTHROID) 75 MCG tablet TAKE 1 TABLET BY MOUTH EVERY DAY ON AN EMPTY STOMACH Patient taking differently: Take 75 mcg by mouth every evening.  06/30/20   Mar Daring, PA-C  metoprolol succinate (TOPROL-XL) 25 MG 24 hr tablet Take 25 mg by mouth daily at 12 noon.  08/21/20   [provider]  montelukast (SINGULAIR) 10 MG tablet TAKE 1 TABLET BY MOUTH EVERY DAY IN THE EVENING Patient taking  differently: Take 10 mg by mouth every evening.  05/08/20   Mar Daring, PA-C  Multiple Vitamin (MULTIVITAMIN WITH MINERALS) TABS tablet Take 1 tablet by mouth daily.    [provider]  naphazoline-pheniramine (ALLERGY EYE) 0.025-0.3 % ophthalmic solution Place 1 drop into both eyes 4 (four) times daily as needed for eye irritation.    [provider]  NONFORMULARY OR COMPOUNDED ITEM Place 0.5 g vaginally every other day. At night (Estradiol 1mg /gm cream)    [provider]  pantoprazole (PROTONIX) 40 MG tablet Take 1 tablet (40 mg total) by mouth daily. 07/07/20   Mar Daring, PA-C  pseudoephedrine (SUDAFED) 60 MG tablet Take 30 mg by mouth every 4 (four) hours as needed (allergies.).    [provider]  Spacer/Aero-Holding Chambers (AEROCHAMBER MV) inhaler Use as instructed 09/06/19   Tyler Pita, MD  vitamin E (VITAMIN E) 400 UNIT capsule Take 400 Units by mouth daily.     [provider]    Allergies Claritin [loratadine], Fructose, Gluten meal, Ipratropium, Lactase, Levaquin [levofloxacin], and Sulfa drugs cross reactors  Family History  Problem Relation Age of Onset  . Heart disease Mother        CAD  . Stroke Mother        x 2  . Mental illness Mother        not diagnosed  . Irritable bowel syndrome Mother   . Heart disease Father        CAD  . Parkinsonism Father   . Cancer Sister        Breast  . Cancer Other        Breast and Ovarian  . Heart disease Brother   . Schizophrenia Sister   . Lung disease Sister     Social History Social History   Tobacco Use  . Smoking status: Never Smoker  . Smokeless tobacco: Never Used  Vaping Use  . Vaping Use: Never used  Substance Use Topics  . Alcohol use: No  . Drug use: No    Review of Systems  Review of Systems  Constitutional: Negative for chills and fever.  HENT: Negative for sore throat.   Eyes: Negative for pain.  Respiratory: Positive for  shortness of breath. Negative for cough and stridor.   Cardiovascular: Positive for chest pain.  Gastrointestinal: Negative for vomiting.  Genitourinary: Negative for dysuria.  Musculoskeletal: Positive for neck pain.  Skin: Negative for rash.  Neurological: Positive for dizziness and headaches. Negative for seizures and loss of consciousness.  Psychiatric/Behavioral: Negative for suicidal ideas.  All other systems reviewed and are negative.     ____________________________________________   PHYSICAL EXAM:  VITAL SIGNS: ED Triage Vitals [08/22/20 1447]  Enc Vitals Group     BP      Pulse      Resp      Temp      Temp src      SpO2      Weight 140 lb (63.5 kg)     Height 5\' 7"  (1.702 m)     Head Circumference      Peak Flow      Pain Score 6     Pain Loc      Pain Edu?      Excl. in [place]?    Vitals:   08/22/20 1600 08/22/20 1700  BP: 111/67 140/89  Pulse: 61 63  Resp: 14 16  Temp:  97.6 F (36.4 C)  SpO2: 93% 95%   Physical Exam Vitals and nursing note reviewed.  Constitutional:      General: She is not in acute distress.    Appearance: She is well-developed.  HENT:     Head: Normocephalic and atraumatic.     Right Ear: External ear normal.     Left Ear: External ear normal.     Nose: Nose normal.     Mouth/Throat:     Mouth: Mucous membranes are moist.  Eyes:     Conjunctiva/sclera: Conjunctivae normal.  Cardiovascular:     Rate and Rhythm: Normal rate and regular rhythm.     Heart sounds: No murmur heard.   Pulmonary:     Effort: Pulmonary effort is normal. No respiratory distress.     Breath sounds: Normal breath sounds.  Abdominal:     Palpations: Abdomen is soft.     Tenderness:  There is no abdominal tenderness. There is no right CVA tenderness or left CVA tenderness.  Musculoskeletal:     Cervical back: Neck supple.  Skin:    General: Skin is warm and dry.     Capillary Refill: Capillary refill takes less than 2 seconds.  Neurological:      Mental Status: She is alert and oriented to person, place, and time.  Psychiatric:        Mood and Affect: Mood normal.     Parameters.  No finger dysmetria.  Cranial nerves II to XII are grossly intact.  Patient has full symmetric strength of all extremities.  Sensation is intact to light touch to the lower extremities. ____________________________________________   LABS (all labs ordered are listed, but only abnormal results are displayed)  Labs Reviewed  BASIC METABOLIC PANEL - Abnormal; Notable for the following components:      Result Value   Glucose, Bld 109 (*)    BUN 29 (*)    All other components within normal limits  CBC  BRAIN NATRIURETIC PEPTIDE  HEPATIC FUNCTION PANEL  PROCALCITONIN  LIPASE, BLOOD  FIBRIN DERIVATIVES D-DIMER ([facility] ONLY)  TROPONIN I (HIGH SENSITIVITY)  TROPONIN I (HIGH SENSITIVITY)   ____________________________________________  EKG  Sinus rhythm with a ventricular of 74, normal axis, unremarkable intervals, and nonspecific ST changes in lead III no other clear evidence of acute ischemia or other significant underlying arrhythmia. ____________________________________________  RADIOLOGY  ED MD interpretation: CT head shows no evidence of intracranial hemorrhage or other acute intracranial findings.  Official radiology report(s): CT Angio Head W or Wo Contrast  Result Date: 08/22/2020 CLINICAL DATA:  Stroke/TIA. EXAM: CT ANGIOGRAPHY HEAD AND NECK TECHNIQUE: Multidetector CT imaging of the head and neck was performed using the standard protocol during bolus administration of intravenous contrast. Multiplanar CT image reconstructions and MIPs were obtained to evaluate the vascular anatomy. Carotid stenosis measurements (when applicable) are obtained utilizing NASCET criteria, using the distal internal carotid diameter as the denominator. CONTRAST:  68mL OMNIPAQUE IOHEXOL 350 MG/ML SOLN COMPARISON:  MRI 04/05/2020. FINDINGS: CT HEAD FINDINGS Brain: No  evidence of acute large vascular territory infarction, hemorrhage, hydrocephalus, extra-axial collection or mass lesion/mass effect. Low density within the inferior basal ganglia, compatible with dilated perivascular space when correlating with prior MRI Vascular: Calcific atherosclerosis. Skull: No acute fracture. Sinuses: No acute findings. Orbits: No acute findings. Review of the MIP images confirms the above findings CTA NECK FINDINGS Aortic arch: Imaged portion shows no evidence of aneurysm or dissection. No significant stenosis of the major arch vessel origins. Right carotid system: No evidence of dissection, stenosis (50% or greater) or occlusion. Left carotid system: No evidence of dissection, stenosis (50% or greater) or occlusion. Vertebral arteries: Left dominant. No evidence of dissection, stenosis (50% or greater) or occlusion. Skeleton: No acute findings. Other neck: Approximately 1.1 cm low-density nodule within the right thyroid gland, which does not require further imaging follow-up per current guidelines. Absent left thyroid gland. Upper chest: No acute findings. Review of the MIP images confirms the above findings CTA HEAD FINDINGS Anterior circulation: No significant (greater than 50%) stenosis, proximal occlusion, aneurysm, or vascular malformation. There is calcific atherosclerosis of the left greater than right cavernous carotid arteries without evidence of greater than 50% narrowing. Trifurcate ACA. Posterior circulation: No significant (greater than 50%) focal stenosis or large vessel occlusion. Mild narrowing of the non dominant proximal right intradural vertebral artery. Left fetal type PCA. Venous sinuses: As permitted by contrast timing, patent. Anatomic  variants: Left fetal type PCA.Trifurcate ACA. Review of the MIP images confirms the above findings IMPRESSION: 1. No evidence of acute intracranial abnormality. 2. No large vessel occlusion or significant (greater than 50%) stenosis in  the head or neck. Electronically Signed   By: Margaretha Sheffield MD   On: 08/22/2020 16:43   DG Chest 2 View  Result Date: 08/22/2020 CLINICAL DATA:  Chest pressure, short of breath EXAM: CHEST - 2 VIEW COMPARISON:  08/20/2020 FINDINGS: Frontal and lateral views of the chest demonstrate a stable cardiac silhouette. No acute airspace disease, effusion, or pneumothorax. Chronic elevation of the left hemidiaphragm. No acute bony abnormalities. IMPRESSION: 1. No acute intrathoracic process. Electronically Signed   By: Randa Ngo M.D.   On: 08/22/2020 15:38   CT Angio Neck W and/or Wo Contrast  Result Date: 08/22/2020 CLINICAL DATA:  Stroke/TIA. EXAM: CT ANGIOGRAPHY HEAD AND NECK TECHNIQUE: Multidetector CT imaging of the head and neck was performed using the standard protocol during bolus administration of intravenous contrast. Multiplanar CT image reconstructions and MIPs were obtained to evaluate the vascular anatomy. Carotid stenosis measurements (when applicable) are obtained utilizing NASCET criteria, using the distal internal carotid diameter as the denominator. CONTRAST:  41mL OMNIPAQUE IOHEXOL 350 MG/ML SOLN COMPARISON:  MRI 04/05/2020. FINDINGS: CT HEAD FINDINGS Brain: No evidence of acute large vascular territory infarction, hemorrhage, hydrocephalus, extra-axial collection or mass lesion/mass effect. Low density within the inferior basal ganglia, compatible with dilated perivascular space when correlating with prior MRI Vascular: Calcific atherosclerosis. Skull: No acute fracture. Sinuses: No acute findings. Orbits: No acute findings. Review of the MIP images confirms the above findings CTA NECK FINDINGS Aortic arch: Imaged portion shows no evidence of aneurysm or dissection. No significant stenosis of the major arch vessel origins. Right carotid system: No evidence of dissection, stenosis (50% or greater) or occlusion. Left carotid system: No evidence of dissection, stenosis (50% or greater) or  occlusion. Vertebral arteries: Left dominant. No evidence of dissection, stenosis (50% or greater) or occlusion. Skeleton: No acute findings. Other neck: Approximately 1.1 cm low-density nodule within the right thyroid gland, which does not require further imaging follow-up per current guidelines. Absent left thyroid gland. Upper chest: No acute findings. Review of the MIP images confirms the above findings CTA HEAD FINDINGS Anterior circulation: No significant (greater than 50%) stenosis, proximal occlusion, aneurysm, or vascular malformation. There is calcific atherosclerosis of the left greater than right cavernous carotid arteries without evidence of greater than 50% narrowing. Trifurcate ACA. Posterior circulation: No significant (greater than 50%) focal stenosis or large vessel occlusion. Mild narrowing of the non dominant proximal right intradural vertebral artery. Left fetal type PCA. Venous sinuses: As permitted by contrast timing, patent. Anatomic variants: Left fetal type PCA.Trifurcate ACA. Review of the MIP images confirms the above findings IMPRESSION: 1. No evidence of acute intracranial abnormality. 2. No large vessel occlusion or significant (greater than 50%) stenosis in the head or neck. Electronically Signed   By: Margaretha Sheffield MD   On: 08/22/2020 16:43    ____________________________________________   PROCEDURES  Procedure(s) performed (including Critical Care):  .1-3 Lead EKG Interpretation Performed by: Lucrezia Starch, MD Authorized by: Lucrezia Starch, MD     Interpretation: normal     ECG rate assessment: normal     Rhythm: sinus rhythm     Ectopy: none     Conduction: normal       ____________________________________________   INITIAL IMPRESSION / ASSESSMENT AND PLAN / ED COURSE  Patient presents with [place] to history exam for assessment of multiple complaints including headache as described above as well as some chest pressure and substernal burning  associate with shortness of breath nonproductive cough.  On arrival patient is afebrile hemodynamically stable.  With regard to patient's headache and left-sided neck pain differential includes but is not limited to primary headache versus anemia versus metabolic derangement versus carotid dissection versus intracranial hemorrhage.  No history or exam findings to suggest an acute traumatic injury or acute infectious process.  CT head obtained shows no evidence of vascular dissection or injury, intracranial hemorrhage, or other acute intracranial process.  No evidence of anemia or significant metabolic derangement on above labs.  Unclear etiology for patient's headache at this time although very low suspicion for immediate life-threatening pathology.  Patient given below noted headache cocktail including IV fluids, Compazine, and Tylenol.  With regard to patient's chest pain shortness of breath differential includes but is not limited to ACS, GERD, PE, pneumonia, pneumothorax, symptomatic effusion, and CHF.  There is no wheezing on exam to suggest asthma exacerbation.  ECG shows no evidence of arrhythmia.  Chest x-ray shows no evidence of pneumonia, pneumothorax and CBC shows no evidence of acute anemia.  Procalcitonin is not elevated and no other findings on exam or history to suggest an acute infectious process.  Hepatic function panel shows evidence of cholestasis and lipase not consistent with pancreatitis.  BMP is within normal intima consistent with volume overload.  Low suspicion for ACS given reassuring EKG and 2 nonelevated troponins obtained over 2 hours.  Low suspicion for PE as patient's D-dimer is less than 500.  GERD is also still within the differential.  After below noted medications patient states felt a little better.  She also states she was to be discharged.  I did speak with on-call cardiologist Dr. Nehemiah Massed who agreed that discharge with continued routine outpatient evaluation was  appropriate.  Given reassuring vitals with otherwise reassuring exam and work-up with improvement in symptoms on reassessment I believe patient safe for discharge with plan for outpatient continued cardiac work-up and PCP follow-up.  Discharged stable condition.  Return precautions advised and discussed.   ____________________________________________   FINAL CLINICAL IMPRESSION(S) / ED DIAGNOSES  Final diagnoses:  Chest pain, unspecified type  SOB (shortness of breath)  Nonintractable headache, unspecified chronicity pattern, unspecified headache type    Medications  nitroGLYCERIN (NITROSTAT) SL tablet 0.4 mg (has no administration in time range)  acetaminophen (TYLENOL) tablet 1,000 mg (1,000 mg Oral Not Given 08/22/20 1805)  lactated ringers bolus 1,000 mL (has no administration in time range)  prochlorperazine (COMPAZINE) injection 10 mg (10 mg Intravenous Given 08/22/20 1532)  alum & mag hydroxide-simeth (MAALOX/MYLANTA) 200-200-20 MG/5ML suspension 30 mL (30 mLs Oral Given 08/22/20 1719)  iohexol (OMNIPAQUE) 350 MG/ML injection 75 mL (75 mLs Intravenous Contrast Given 08/22/20 1604)  acetaminophen (TYLENOL) tablet 1,000 mg (1,000 mg Oral Given 08/22/20 1718)     ED Discharge Orders    None       Note:  This document was prepared using Dragon voice recognition software and may include unintentional dictation errors.   Lucrezia Starch, MD 08/22/20 [place]    Lucrezia Starch, MD 08/22/20 [phone]

## 2020-08-22 NOTE — ED Triage Notes (Signed)
Pt comes via POV from home with c/o pressure to Chest, and SOB. Pt states  This started about hour ago. Pt states she is having more trouble breathing.  Pt states she is scheduled for cath on Friday.  Pt also states she took her long acting nitro, chewed 2 baby aspirin and she took her metoprolol.  Pt states severe pain in head and neck. Pt states no pain at this time in chest. Pt states she just feels like she can't get any air.

## 2020-08-23 ENCOUNTER — Other Ambulatory Visit
Admission: RE | Admit: 2020-08-23 | Discharge: 2020-08-23 | Disposition: A | Discharge status: Home / Self Care | Payer: Medicare Other | Source: Ambulatory Visit | Attending: Internal Medicine | Admitting: Internal Medicine

## 2020-08-23 DIAGNOSIS — Z20822 Contact with and (suspected) exposure to covid-19: Secondary | ICD-10-CM | POA: Insufficient documentation

## 2020-08-23 DIAGNOSIS — Z01812 Encounter for preprocedural laboratory examination: Secondary | ICD-10-CM | POA: Insufficient documentation

## 2020-08-23 LAB — SARS CORONAVIRUS 2 (TAT 6-24 HRS): SARS Coronavirus 2: NEGATIVE

## 2020-08-25 ENCOUNTER — Encounter: Payer: Self-pay | Admitting: Internal Medicine

## 2020-08-25 ENCOUNTER — Encounter
Admission: RE | Disposition: A | Discharge status: Home / Self Care | Payer: Self-pay | Source: Home / Self Care | Attending: Internal Medicine

## 2020-08-25 ENCOUNTER — Ambulatory Visit
Admission: RE | Admit: 2020-08-25 | Discharge: 2020-08-25 | Disposition: A | Discharge status: Home / Self Care | Payer: Medicare Other | Attending: Internal Medicine | Admitting: Internal Medicine

## 2020-08-25 ENCOUNTER — Other Ambulatory Visit: Payer: Self-pay

## 2020-08-25 DIAGNOSIS — I209 Angina pectoris, unspecified: Secondary | ICD-10-CM | POA: Diagnosis not present

## 2020-08-25 DIAGNOSIS — Z79899 Other long term (current) drug therapy: Secondary | ICD-10-CM | POA: Diagnosis not present

## 2020-08-25 DIAGNOSIS — Z7982 Long term (current) use of aspirin: Secondary | ICD-10-CM | POA: Insufficient documentation

## 2020-08-25 DIAGNOSIS — R079 Chest pain, unspecified: Secondary | ICD-10-CM | POA: Insufficient documentation

## 2020-08-25 DIAGNOSIS — R0602 Shortness of breath: Secondary | ICD-10-CM | POA: Diagnosis not present

## 2020-08-25 DIAGNOSIS — Z91011 Allergy to milk products: Secondary | ICD-10-CM | POA: Insufficient documentation

## 2020-08-25 DIAGNOSIS — Z888 Allergy status to other drugs, medicaments and biological substances status: Secondary | ICD-10-CM | POA: Insufficient documentation

## 2020-08-25 DIAGNOSIS — R000 Tachycardia, unspecified: Secondary | ICD-10-CM | POA: Diagnosis not present

## 2020-08-25 DIAGNOSIS — Z882 Allergy status to sulfonamides status: Secondary | ICD-10-CM | POA: Insufficient documentation

## 2020-08-25 DIAGNOSIS — K219 Gastro-esophageal reflux disease without esophagitis: Secondary | ICD-10-CM | POA: Diagnosis not present

## 2020-08-25 DIAGNOSIS — I200 Unstable angina: Secondary | ICD-10-CM

## 2020-08-25 DIAGNOSIS — J45909 Unspecified asthma, uncomplicated: Secondary | ICD-10-CM | POA: Insufficient documentation

## 2020-08-25 HISTORY — PX: LEFT HEART CATH AND CORONARY ANGIOGRAPHY: CATH118249

## 2020-08-25 SURGERY — LEFT HEART CATH AND CORONARY ANGIOGRAPHY
Anesthesia: Moderate Sedation

## 2020-08-25 MED ORDER — FENTANYL CITRATE (PF) 100 MCG/2ML IJ SOLN
INTRAMUSCULAR | Status: AC
  Filled 2020-08-25: qty 2

## 2020-08-25 MED ORDER — MIDAZOLAM HCL 2 MG/2ML IJ SOLN
INTRAMUSCULAR | Status: AC
  Filled 2020-08-25: qty 2

## 2020-08-25 MED ORDER — LIDOCAINE HCL (PF) 1 % IJ SOLN
INTRAMUSCULAR | Status: AC
  Filled 2020-08-25: qty 30

## 2020-08-25 MED ORDER — ASPIRIN 81 MG PO CHEW
CHEWABLE_TABLET | ORAL | Status: AC
  Administered 2020-08-25: 81 mg via ORAL
  Filled 2020-08-25: qty 1

## 2020-08-25 MED ORDER — SODIUM CHLORIDE 0.9% FLUSH: 3.0000 mL | INTRAVENOUS | Status: DC | PRN

## 2020-08-25 MED ORDER — ACETAMINOPHEN 325 MG PO TABS: 650.0000 mg | ORAL_TABLET | ORAL | Status: DC | PRN

## 2020-08-25 MED ORDER — MIDAZOLAM HCL 2 MG/2ML IJ SOLN
INTRAMUSCULAR | Status: DC | PRN
  Administered 2020-08-25: 1 mg via INTRAVENOUS

## 2020-08-25 MED ORDER — ONDANSETRON HCL 4 MG/2ML IJ SOLN: 4.0000 mg | Freq: Four times a day (QID) | INTRAMUSCULAR | Status: DC | PRN

## 2020-08-25 MED ORDER — LIDOCAINE HCL (PF) 1 % IJ SOLN
INTRAMUSCULAR | Status: DC | PRN
  Administered 2020-08-25: 15 mL via SUBCUTANEOUS

## 2020-08-25 MED ORDER — HEPARIN (PORCINE) IN NACL 1000-0.9 UT/500ML-% IV SOLN
INTRAVENOUS | Status: DC | PRN
  Administered 2020-08-25: 500 mL

## 2020-08-25 MED ORDER — SODIUM CHLORIDE 0.9 % IV SOLN: 250.0000 mL | INTRAVENOUS | Status: DC | PRN

## 2020-08-25 MED ORDER — ASPIRIN 81 MG PO CHEW: 81.0000 mg | CHEWABLE_TABLET | ORAL | Status: AC

## 2020-08-25 MED ORDER — IOHEXOL 300 MG/ML  SOLN
INTRAMUSCULAR | Status: DC | PRN
  Administered 2020-08-25: 70 mL

## 2020-08-25 MED ORDER — SODIUM CHLORIDE 0.9 % WEIGHT BASED INFUSION
3.0000 mL/kg/h | INTRAVENOUS | Status: AC
  Administered 2020-08-25: 3 mL/kg/h via INTRAVENOUS

## 2020-08-25 MED ORDER — HYDRALAZINE HCL 20 MG/ML IJ SOLN: 10.0000 mg | INTRAMUSCULAR | Status: DC | PRN

## 2020-08-25 MED ORDER — SODIUM CHLORIDE 0.9 % WEIGHT BASED INFUSION: 1.0000 mL/kg/h | INTRAVENOUS | Status: DC

## 2020-08-25 MED ORDER — LABETALOL HCL 5 MG/ML IV SOLN: 10.0000 mg | INTRAVENOUS | Status: DC | PRN

## 2020-08-25 MED ORDER — HEPARIN (PORCINE) IN NACL 1000-0.9 UT/500ML-% IV SOLN
INTRAVENOUS | Status: AC
  Filled 2020-08-25: qty 1000

## 2020-08-25 MED ORDER — FENTANYL CITRATE (PF) 100 MCG/2ML IJ SOLN
INTRAMUSCULAR | Status: DC | PRN
  Administered 2020-08-25: 25 ug via INTRAVENOUS

## 2020-08-25 MED ORDER — SODIUM CHLORIDE 0.9% FLUSH: 3.0000 mL | Freq: Two times a day (BID) | INTRAVENOUS | Status: DC

## 2020-08-25 SURGICAL SUPPLY — 11 items
CATH INFINITI 5FR JL4 (CATHETERS) ×3 IMPLANT
CATH INFINITI JR4 5F (CATHETERS) ×3 IMPLANT
DEVICE CLOSURE MYNXGRIP 5F (Vascular Products) ×3 IMPLANT
KIT LHK - MID 3VCOMP MANIF (MISCELLANEOUS) ×3 IMPLANT
NDL PERC 18GX7CM (NEEDLE) IMPLANT
NEEDLE PERC 18GX7CM (NEEDLE) ×4 IMPLANT
PACK CARDIAC CATH (CUSTOM PROCEDURE TRAY) ×4 IMPLANT
SHEATH AVANTI 5FR X 11CM (SHEATH) ×3 IMPLANT
SHEATH AVANTI 7FRX11 (SHEATH) IMPLANT
TUBING CIL FLEX 10 FLL-RA (TUBING) ×3 IMPLANT
WIRE GUIDERIGHT .035X150 (WIRE) ×3 IMPLANT

## 2020-09-05 DIAGNOSIS — R0602 Shortness of breath: Secondary | ICD-10-CM | POA: Diagnosis not present

## 2020-09-05 DIAGNOSIS — R079 Chest pain, unspecified: Secondary | ICD-10-CM | POA: Diagnosis not present

## 2020-09-07 DIAGNOSIS — R000 Tachycardia, unspecified: Secondary | ICD-10-CM | POA: Diagnosis not present

## 2020-09-07 DIAGNOSIS — Z23 Encounter for immunization: Secondary | ICD-10-CM | POA: Diagnosis not present

## 2020-09-07 DIAGNOSIS — R002 Palpitations: Secondary | ICD-10-CM | POA: Diagnosis not present

## 2020-09-07 DIAGNOSIS — K219 Gastro-esophageal reflux disease without esophagitis: Secondary | ICD-10-CM | POA: Diagnosis not present

## 2020-09-07 DIAGNOSIS — J4520 Mild intermittent asthma, uncomplicated: Secondary | ICD-10-CM | POA: Diagnosis not present

## 2020-09-07 DIAGNOSIS — R079 Chest pain, unspecified: Secondary | ICD-10-CM | POA: Diagnosis not present

## 2020-09-07 DIAGNOSIS — R0602 Shortness of breath: Secondary | ICD-10-CM | POA: Diagnosis not present

## 2020-09-11 ENCOUNTER — Ambulatory Visit: Payer: Medicare Other | Admitting: Physician Assistant

## 2020-09-18 ENCOUNTER — Ambulatory Visit (INDEPENDENT_AMBULATORY_CARE_PROVIDER_SITE_OTHER): Payer: Medicare Other | Admitting: Gastroenterology

## 2020-09-18 ENCOUNTER — Encounter: Payer: Self-pay | Admitting: Gastroenterology

## 2020-09-18 ENCOUNTER — Other Ambulatory Visit: Payer: Self-pay

## 2020-09-18 ENCOUNTER — Encounter: Payer: Self-pay | Admitting: Physician Assistant

## 2020-09-18 ENCOUNTER — Ambulatory Visit (INDEPENDENT_AMBULATORY_CARE_PROVIDER_SITE_OTHER): Payer: Medicare Other | Admitting: Physician Assistant

## 2020-09-18 VITALS — BP 112/61 | HR 80 | Temp 98.0°F | Ht 67.0 in | Wt 140.0 lb

## 2020-09-18 VITALS — BP 114/72 | HR 69 | Temp 97.7°F | Resp 16 | Wt 141.3 lb

## 2020-09-18 DIAGNOSIS — J45909 Unspecified asthma, uncomplicated: Secondary | ICD-10-CM

## 2020-09-18 DIAGNOSIS — R1013 Epigastric pain: Secondary | ICD-10-CM

## 2020-09-18 DIAGNOSIS — I200 Unstable angina: Secondary | ICD-10-CM

## 2020-09-18 DIAGNOSIS — E079 Disorder of thyroid, unspecified: Secondary | ICD-10-CM | POA: Diagnosis not present

## 2020-09-18 DIAGNOSIS — J301 Allergic rhinitis due to pollen: Secondary | ICD-10-CM

## 2020-09-18 DIAGNOSIS — Z23 Encounter for immunization: Secondary | ICD-10-CM

## 2020-09-18 MED ORDER — ALBUTEROL SULFATE HFA 108 (90 BASE) MCG/ACT IN AERS: INHALATION_SPRAY | RESPIRATORY_TRACT | 3 refills | Status: DC

## 2020-09-18 MED ORDER — FLOVENT HFA 220 MCG/ACT IN AERO: INHALATION_SPRAY | RESPIRATORY_TRACT | 1 refills | Status: DC

## 2020-09-18 MED ORDER — LEVOTHYROXINE SODIUM 75 MCG PO TABS: ORAL_TABLET | ORAL | 0 refills | Status: DC

## 2020-09-18 MED ORDER — FLUTICASONE PROPIONATE 50 MCG/ACT NA SUSP: NASAL | 1 refills | Status: DC

## 2020-09-18 NOTE — Progress Notes (Signed)
Established patient visit   Patient: Kimberly Mercer   DOB: June 12, 1946   74 y.o. Female  MRN: [id number] Visit Date: 09/18/2020  Today's healthcare provider: Mar Daring, PA-C   No chief complaint on file.  Subjective    HPI  Follow up ER visit  Patient was seen in ER for chest pain on 08/22/20 and seen again at ER 08/25/20 for unstable Angina Treatment for this included seen by [facility], Cardiologist. She had a cath done, echocardiogram. All testing was unremarkable. She reports excellent compliance with treatment. She reports this condition is Improved.  Feels her symptoms may be more related to her gallbladder. She has an appt scheduled with Dr. Marius Ditch, GI. She is already eating a diet for gallbladder and has reported symptom improvement. -----------------------------------------------------------------------------------------   Patient Active Problem List   Diagnosis Date Noted  . Syncope 04/30/2018  . Chronic midline low back pain 03/31/2018  . Primary osteoarthritis of both first carpometacarpal joints 12/18/2015  . Thyroid disease 01/03/2014  . Tachycardia 01/03/2014  . Palpitation 01/03/2014  . Menopause 07/06/2012  . Asthma 07/06/2012  . GERD (gastroesophageal reflux disease) 07/06/2012  . Allergic rhinitis 07/06/2012  . Varicose veins 07/06/2012   Past Medical History:  Diagnosis Date  . Allergic rhinitis, cause unspecified   . Allergy   . Asthma   . Benign neoplasm of colon   . Cancer ([place])   . Candidiasis of the esophagus   . Chicken pox   . Diverticulitis of colon (without mention of hemorrhage)(562.11)   . E coli enteritis   . GERD (gastroesophageal reflux disease)   . Hemorrhoids    internal  . Measles   . Osteoarthrosis, unspecified whether generalized or localized, unspecified site   . Symptomatic menopausal or female climacteric states   . Unspecified asthma(493.90)   . Unspecified essential hypertension   . Unspecified  hypothyroidism        Medications: Outpatient Medications Prior to Visit  Medication Sig  . acetaminophen (TYLENOL 8 HOUR) 650 MG CR tablet Take 650 mg by mouth every 8 (eight) hours as needed for pain.   Marland Kitchen albuterol (PROVENTIL) (2.5 MG/3ML) 0.083% nebulizer solution Take 3 mLs (2.5 mg total) by nebulization every 6 (six) hours as needed for wheezing or shortness of breath. Dx:J45.909  . albuterol (VENTOLIN HFA) 108 (90 Base) MCG/ACT inhaler TAKE 2 PUFFS BY MOUTH EVERY 6 HOURS AS NEEDED FOR WHEEZE OR SHORTNESS OF BREATH  . aspirin EC 81 MG tablet Take 81 mg by mouth every evening. Swallow whole.  . Calcium-Magnesium-Zinc (CAL-MAG-ZINC PO) Take 1 tablet by mouth daily.  Marland Kitchen CANNABIDIOL PO Take 30 mg by mouth daily. Capsule  . cholecalciferol (VITAMIN D3) 25 MCG (1000 UNIT) tablet Take 1,000 Units by mouth daily.  . diclofenac sodium (VOLTAREN) 1 % GEL Apply 2 g topically 4 (four) times daily as needed (pain.).   Marland Kitchen diphenhydrAMINE (BENADRYL) 25 MG tablet Take 25 mg by mouth every 6 (six) hours as needed (allergies.).  Marland Kitchen famotidine (PEPCID) 20 MG tablet Take 20 mg by mouth at bedtime as needed (breakthrough heartburn/indigestion).   Marland Kitchen FLOVENT HFA 220 MCG/ACT inhaler TAKE 1 PUFF BY MOUTH TWICE A DAY (Patient taking differently: TAKE 1 PUFF BY MOUTH TWICE A DAY)  . fluticasone (FLONASE) 50 MCG/ACT nasal spray SPRAY 2 SPRAYS INTO EACH NOSTRIL EVERY DAY (Patient taking differently: Place 2 sprays into both nostrils every evening. )  . ibuprofen (ADVIL) 200 MG tablet Take 200 mg by mouth every  8 (eight) hours as needed (pain.).  Marland Kitchen levothyroxine (SYNTHROID) 75 MCG tablet TAKE 1 TABLET BY MOUTH EVERY DAY ON AN EMPTY STOMACH (Patient taking differently: Take 75 mcg by mouth every evening. )  . montelukast (SINGULAIR) 10 MG tablet TAKE 1 TABLET BY MOUTH EVERY DAY IN THE EVENING (Patient taking differently: Take 10 mg by mouth every evening. )  . Multiple Vitamin (MULTIVITAMIN WITH MINERALS) TABS tablet Take  1 tablet by mouth daily.  . naphazoline-pheniramine (ALLERGY EYE) 0.025-0.3 % ophthalmic solution Place 1 drop into both eyes 4 (four) times daily as needed for eye irritation.  . NONFORMULARY OR COMPOUNDED ITEM Place 0.5 g vaginally every other day. At night (Estradiol 1mg /gm cream)  . pantoprazole (PROTONIX) 40 MG tablet Take 1 tablet (40 mg total) by mouth daily.  . pseudoephedrine (SUDAFED) 60 MG tablet Take 30 mg by mouth every 4 (four) hours as needed (allergies.).   Marland Kitchen Spacer/Aero-Holding Chambers (AEROCHAMBER MV) inhaler Use as instructed  . vitamin E (VITAMIN E) 400 UNIT capsule Take 400 Units by mouth daily.   . isosorbide mononitrate (IMDUR) 30 MG 24 hr tablet Take 30 mg by mouth daily at 12 noon.   . metoprolol succinate (TOPROL-XL) 25 MG 24 hr tablet Take 25 mg by mouth daily at 12 noon.    No facility-administered medications prior to visit.    Review of Systems  Constitutional: Negative.   Respiratory: Negative.   Cardiovascular: Negative.   Gastrointestinal: Negative.   Neurological: Negative.     Last CBC Lab Results  Component Value Date   WBC 5.6 08/22/2020   HGB 14.8 08/22/2020   HCT 42.4 08/22/2020   MCV 93.0 08/22/2020   MCH 32.5 08/22/2020   RDW 12.2 08/22/2020   PLT 295 91/50/5697   Last metabolic panel Lab Results  Component Value Date   GLUCOSE 109 (H) 08/22/2020   NA 140 08/22/2020   K 3.9 08/22/2020   CL 102 08/22/2020   CO2 26 08/22/2020   BUN 29 (H) 08/22/2020   CREATININE 0.91 08/22/2020   GFRNONAA >60 08/22/2020   GFRAA 90 03/21/2020   CALCIUM 10.3 08/22/2020   PROT 7.4 08/22/2020   ALBUMIN 4.6 08/22/2020   LABGLOB 2.1 03/21/2020   AGRATIO 2.2 03/21/2020   BILITOT 0.6 08/22/2020   ALKPHOS 65 08/22/2020   AST 33 08/22/2020   ALT 26 08/22/2020   ANIONGAP 12 08/22/2020      Objective    BP 114/72 (BP Location: Left Arm, Patient Position: Sitting, Cuff Size: Normal)   Pulse 69   Temp 97.7 F (36.5 C) (Oral)   Resp 16   Wt 141  lb 4.8 oz (64.1 kg)   BMI 22.13 kg/m  BP Readings from Last 3 Encounters:  09/18/20 112/61  09/18/20 114/72  08/25/20 120/64   Wt Readings from Last 3 Encounters:  09/18/20 140 lb (63.5 kg)  09/18/20 141 lb 4.8 oz (64.1 kg)  08/25/20 140 lb (63.5 kg)      Physical Exam Vitals reviewed.  Constitutional:      General: She is not in acute distress.    Appearance: Normal appearance. She is well-developed and normal weight. She is not ill-appearing or diaphoretic.  Cardiovascular:     Rate and Rhythm: Normal rate and regular rhythm.     Pulses: Normal pulses.     Heart sounds: Normal heart sounds. No murmur heard.  No friction rub. No gallop.   Pulmonary:     Effort: Pulmonary effort is normal. No  respiratory distress.     Breath sounds: Normal breath sounds. No wheezing or rales.  Musculoskeletal:     Cervical back: Normal range of motion and neck supple.  Neurological:     Mental Status: She is alert.      No results found for any visits on 09/18/20.  Assessment & Plan     1. Persistent asthma without complication, unspecified asthma severity Stable. Diagnosis pulled for medication refill. Continue current medical treatment plan. - fluticasone (FLOVENT HFA) 220 MCG/ACT inhaler; TAKE 1 PUFF BY MOUTH TWICE A DAY  Dispense: 12 each; Refill: 1 - albuterol (VENTOLIN HFA) 108 (90 Base) MCG/ACT inhaler; TAKE 2 PUFFS BY MOUTH EVERY 6 HOURS AS NEEDED FOR WHEEZE OR SHORTNESS OF BREATH  Dispense: 18 g; Refill: 3  2. Non-seasonal allergic rhinitis due to pollen Stable. Diagnosis pulled for medication refill. Continue current medical treatment plan. - fluticasone (FLONASE) 50 MCG/ACT nasal spray; SPRAY 2 SPRAYS INTO EACH NOSTRIL EVERY DAY  Dispense: 48 mL; Refill: 1  3. Thyroid disease Stable. Diagnosis pulled for medication refill. Continue current medical treatment plan. - levothyroxine (SYNTHROID) 75 MCG tablet; TAKE 1 TABLET BY MOUTH EVERY DAY ON AN EMPTY STOMACH  Dispense: 90  tablet; Refill: 0  4. Need for 23-polyvalent pneumococcal polysaccharide vaccine Will call back in 2 weeks for Pneumovax.   5. Need for influenza vaccination Flu vaccine given today without complication. Patient sat upright for 15 minutes to check for adverse reaction before being released. - Flu Vaccine QUAD High Dose(Fluad)   No follow-ups on file.      Reynolds Bowl, PA-C, have reviewed all documentation for this visit. The documentation on 09/19/20 for the exam, diagnosis, procedures, and orders are all accurate and complete.   Rubye Beach  [facility] [phone] (phone) [phone] (fax)  [place]

## 2020-09-18 NOTE — Patient Instructions (Signed)
Gallbladder Eating Plan If you have a gallbladder condition, you may have trouble digesting fats. Eating a low-fat diet can help reduce your symptoms, and may be helpful before and after having surgery to remove your gallbladder (cholecystectomy). Your health care provider may recommend that you work with a diet and nutrition specialist (dietitian) to help you reduce the amount of fat in your diet. What are tips for following this plan? General guidelines  Limit your fat intake to less than 30% of your total daily calories. If you eat around 1,800 calories each day, this is less than 60 grams (g) of fat per day.  Fat is an important part of a healthy diet. Eating a low-fat diet can make it hard to maintain a healthy body weight. Ask your dietitian how much fat, calories, and other nutrients you need each day.  Eat small, frequent meals throughout the day instead of three large meals.  Drink at least 8-10 cups of fluid a day. Drink enough fluid to keep your urine clear or pale yellow.  Limit alcohol intake to no more than 1 drink a day for nonpregnant women and 2 drinks a day for men. One drink equals 12 oz of beer, 5 oz of wine, or 1 oz of hard liquor. Reading food labels  Check Nutrition Facts on food labels for the amount of fat per serving. Choose foods with less than 3 grams of fat per serving. Shopping  Choose nonfat and low-fat healthy foods. Look for the words "nonfat," "low fat," or "fat free."  Avoid buying processed or prepackaged foods. Cooking  Cook using low-fat methods, such as baking, broiling, grilling, or boiling.  Cook with small amounts of healthy fats, such as olive oil, grapeseed oil, canola oil, or sunflower oil. What foods are recommended?   All fresh, frozen, or canned fruits and vegetables.  Whole grains.  Low-fat or non-fat (skim) milk and yogurt.  Lean meat, skinless poultry, fish, eggs, and beans.  Low-fat protein supplement powders or  drinks.  Spices and herbs. What foods are not recommended?  High-fat foods. These include baked goods, fast food, fatty cuts of meat, ice cream, french toast, sweet rolls, pizza, cheese bread, foods covered with butter, creamy sauces, or cheese.  Fried foods. These include french fries, tempura, battered fish, breaded chicken, fried breads, and sweets.  Foods with strong odors.  Foods that cause bloating and gas. Summary  A low-fat diet can be helpful if you have a gallbladder condition, or before and after gallbladder surgery.  Limit your fat intake to less than 30% of your total daily calories. This is about 60 g of fat if you eat 1,800 calories each day.  Eat small, frequent meals throughout the day instead of three large meals. This information is not intended to replace advice given to you by your health care provider. Make sure you discuss any questions you have with your health care provider. Document Revised: 01/28/2019 Document Reviewed: 11/14/2016 Elsevier Patient Education  2020 [employer]  
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
## 2020-09-19 LAB — H. PYLORI BREATH TEST: H pylori Breath Test: NEGATIVE

## 2020-10-12 ENCOUNTER — Telehealth: Payer: Self-pay | Admitting: Gastroenterology

## 2020-10-12 MED ORDER — OMEPRAZOLE 40 MG PO CPDR: 40.0000 mg | DELAYED_RELEASE_CAPSULE | Freq: Two times a day (BID) | ORAL | 0 refills | Status: DC

## 2020-10-12 NOTE — Telephone Encounter (Signed)
Per last office visit note you stated Recommend H. pylori breath test.  If this is negative, recommend omeprazole 40 mg 1-2 times daily 2 weeks. H pylori test was negative

## 2020-10-12 NOTE — Telephone Encounter (Signed)
Patient verbalized understanding and sent medication to the pharmacy  

## 2020-10-12 NOTE — Telephone Encounter (Signed)
Let's do it - RV

## 2020-10-12 NOTE — Telephone Encounter (Signed)
Patient seen on 11.29.21 states she is still having the same symptoms for abd pain and not being able to eat. Pt states she thought Dr. Marius Ditch was going to prescribe her medication Prilosec but it was not at her pharmacy. Pt would like to have Prilosec sent in to [facility] to see if that will help and she has been scheduled for follow up on 1.13.22.

## 2020-10-12 NOTE — Addendum Note (Signed)
Addended by: Ulyess Blossom L on: 10/12/2020 12:41 PM   Modules accepted: Orders

## 2020-11-02 ENCOUNTER — Other Ambulatory Visit: Payer: Self-pay

## 2020-11-02 ENCOUNTER — Encounter: Payer: Self-pay | Admitting: Gastroenterology

## 2020-11-02 ENCOUNTER — Ambulatory Visit (INDEPENDENT_AMBULATORY_CARE_PROVIDER_SITE_OTHER): Payer: Medicare Other | Admitting: Gastroenterology

## 2020-11-02 VITALS — BP 133/73 | HR 68 | Temp 97.5°F | Ht 67.0 in | Wt 137.0 lb

## 2020-11-02 DIAGNOSIS — R634 Abnormal weight loss: Secondary | ICD-10-CM

## 2020-11-02 DIAGNOSIS — K219 Gastro-esophageal reflux disease without esophagitis: Secondary | ICD-10-CM

## 2020-11-02 DIAGNOSIS — R1010 Upper abdominal pain, unspecified: Secondary | ICD-10-CM

## 2020-11-02 DIAGNOSIS — R194 Change in bowel habit: Secondary | ICD-10-CM
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
## 2020-11-02 NOTE — Patient Instructions (Addendum)
Your CT scan I scheduled 11/17/2020 at [facility]. Patient needs to arrived at 11:15am for a 11:30am scan. Nothing to eat or drink 4 hours before the scan. Please pick up contrast before the scan at [facility]. Address is [address], [place], [place] [address]

## 2020-11-03 ENCOUNTER — Other Ambulatory Visit: Payer: Self-pay | Admitting: Gastroenterology

## 2020-11-03 LAB — BUN+CREAT
BUN/Creatinine Ratio: 18 (ref 12–28)
BUN: 13 mg/dL (ref 8–27)
Creatinine, Ser: 0.73 mg/dL (ref 0.57–1.00)
GFR calc Af Amer: 95 mL/min/{1.73_m2} (ref >59)
GFR calc non Af Amer: 83 mL/min/{1.73_m2} (ref >59)

## 2020-11-08 DIAGNOSIS — Z1231 Encounter for screening mammogram for malignant neoplasm of breast: Secondary | ICD-10-CM | POA: Diagnosis not present

## 2020-11-08 DIAGNOSIS — R194 Change in bowel habit: Secondary | ICD-10-CM | POA: Diagnosis not present

## 2020-11-10 ENCOUNTER — Other Ambulatory Visit: Payer: Self-pay

## 2020-11-10 ENCOUNTER — Other Ambulatory Visit
Admission: RE | Admit: 2020-11-10 | Discharge: 2020-11-10 | Disposition: A | Discharge status: Home / Self Care | Payer: Medicare Other | Source: Ambulatory Visit | Attending: Gastroenterology | Admitting: Gastroenterology

## 2020-11-10 DIAGNOSIS — Z20822 Contact with and (suspected) exposure to covid-19: Secondary | ICD-10-CM | POA: Diagnosis not present

## 2020-11-10 DIAGNOSIS — Z01812 Encounter for preprocedural laboratory examination: Secondary | ICD-10-CM | POA: Diagnosis not present

## 2020-11-10 LAB — SARS CORONAVIRUS 2 (TAT 6-24 HRS): SARS Coronavirus 2: NEGATIVE

## 2020-11-13 LAB — CALPROTECTIN, FECAL: Calprotectin, Fecal: 78 ug/g (ref 0–120)

## 2020-11-13 LAB — PANCREATIC ELASTASE, FECAL: Pancreatic Elastase, Fecal: 452 ug Elast./g (ref >200)

## 2020-11-14 ENCOUNTER — Encounter
Admission: RE | Disposition: A | Discharge status: Home / Self Care | Payer: Self-pay | Source: Home / Self Care | Attending: Gastroenterology

## 2020-11-14 ENCOUNTER — Ambulatory Visit: Payer: Medicare Other | Admitting: Anesthesiology

## 2020-11-14 ENCOUNTER — Ambulatory Visit
Admission: RE | Admit: 2020-11-14 | Discharge: 2020-11-14 | Disposition: A | Discharge status: Home / Self Care | Payer: Medicare Other | Attending: Gastroenterology | Admitting: Gastroenterology

## 2020-11-14 ENCOUNTER — Telehealth: Payer: Self-pay

## 2020-11-14 ENCOUNTER — Encounter: Payer: Self-pay | Admitting: Gastroenterology

## 2020-11-14 DIAGNOSIS — Z7951 Long term (current) use of inhaled steroids: Secondary | ICD-10-CM | POA: Insufficient documentation

## 2020-11-14 DIAGNOSIS — Z836 Family history of other diseases of the respiratory system: Secondary | ICD-10-CM | POA: Insufficient documentation

## 2020-11-14 DIAGNOSIS — Z882 Allergy status to sulfonamides status: Secondary | ICD-10-CM | POA: Diagnosis not present

## 2020-11-14 DIAGNOSIS — Z859 Personal history of malignant neoplasm, unspecified: Secondary | ICD-10-CM | POA: Diagnosis not present

## 2020-11-14 DIAGNOSIS — Z79899 Other long term (current) drug therapy: Secondary | ICD-10-CM | POA: Diagnosis not present

## 2020-11-14 DIAGNOSIS — K219 Gastro-esophageal reflux disease without esophagitis: Secondary | ICD-10-CM | POA: Diagnosis not present

## 2020-11-14 DIAGNOSIS — Z8249 Family history of ischemic heart disease and other diseases of the circulatory system: Secondary | ICD-10-CM | POA: Insufficient documentation

## 2020-11-14 DIAGNOSIS — K9041 Non-celiac gluten sensitivity: Secondary | ICD-10-CM | POA: Diagnosis not present

## 2020-11-14 DIAGNOSIS — Z803 Family history of malignant neoplasm of breast: Secondary | ICD-10-CM | POA: Insufficient documentation

## 2020-11-14 DIAGNOSIS — B3781 Candidal esophagitis: Secondary | ICD-10-CM | POA: Insufficient documentation

## 2020-11-14 DIAGNOSIS — E739 Lactose intolerance, unspecified: Secondary | ICD-10-CM | POA: Diagnosis not present

## 2020-11-14 DIAGNOSIS — Z8379 Family history of other diseases of the digestive system: Secondary | ICD-10-CM | POA: Diagnosis not present

## 2020-11-14 DIAGNOSIS — Z823 Family history of stroke: Secondary | ICD-10-CM | POA: Insufficient documentation

## 2020-11-14 DIAGNOSIS — Z820 Family history of epilepsy and other diseases of the nervous system: Secondary | ICD-10-CM | POA: Insufficient documentation

## 2020-11-14 DIAGNOSIS — Z8041 Family history of malignant neoplasm of ovary: Secondary | ICD-10-CM | POA: Diagnosis not present

## 2020-11-14 DIAGNOSIS — Z818 Family history of other mental and behavioral disorders: Secondary | ICD-10-CM | POA: Diagnosis not present

## 2020-11-14 DIAGNOSIS — Z888 Allergy status to other drugs, medicaments and biological substances status: Secondary | ICD-10-CM | POA: Insufficient documentation

## 2020-11-14 DIAGNOSIS — Z91018 Allergy to other foods: Secondary | ICD-10-CM | POA: Diagnosis not present

## 2020-11-14 DIAGNOSIS — R634 Abnormal weight loss: Secondary | ICD-10-CM | POA: Diagnosis not present

## 2020-11-14 DIAGNOSIS — R194 Change in bowel habit: Secondary | ICD-10-CM | POA: Diagnosis not present

## 2020-11-14 HISTORY — PX: ESOPHAGOGASTRODUODENOSCOPY (EGD) WITH PROPOFOL: SHX5813

## 2020-11-14 LAB — KOH PREP

## 2020-11-14 SURGERY — ESOPHAGOGASTRODUODENOSCOPY (EGD) WITH PROPOFOL
Anesthesia: General

## 2020-11-14 MED ORDER — PROPOFOL 10 MG/ML IV BOLUS
INTRAVENOUS | Status: DC | PRN
  Administered 2020-11-14 (×2): 20 mg via INTRAVENOUS
  Administered 2020-11-14: 40 mg via INTRAVENOUS

## 2020-11-14 MED ORDER — ONDANSETRON HCL 4 MG/2ML IJ SOLN
INTRAMUSCULAR | Status: DC | PRN
  Administered 2020-11-14: 4 mg via INTRAVENOUS

## 2020-11-14 MED ORDER — SODIUM CHLORIDE 0.9 % IV SOLN
INTRAVENOUS | Status: DC
  Administered 2020-11-14: 1000 mL via INTRAVENOUS

## 2020-11-14 MED ORDER — FLUCONAZOLE 200 MG PO TABS: ORAL_TABLET | ORAL | 0 refills | Status: DC

## 2020-11-14 MED ORDER — PROPOFOL 500 MG/50ML IV EMUL
INTRAVENOUS | Status: DC | PRN
  Administered 2020-11-14: 180 ug/kg/min via INTRAVENOUS

## 2020-11-14 NOTE — Transfer of Care (Signed)
Immediate Anesthesia Transfer of Care Note  Patient: Kimberly Mercer  Procedure(s) Performed: ESOPHAGOGASTRODUODENOSCOPY (EGD) WITH PROPOFOL (N/A )  Patient Location: PACU  Anesthesia Type:General  Level of Consciousness: sedated  Airway & Oxygen Therapy: Patient Spontanous Breathing and Patient connected to nasal cannula oxygen  Post-op Assessment: Report given to RN and Post -op Vital signs reviewed and stable  Post vital signs: Reviewed and stable  Last Vitals:  Vitals Value Taken Time  BP 124/69 11/14/20 0851  Temp 35.7 C 11/14/20 0851  Pulse 63 11/14/20 0854  Resp 18 11/14/20 0854  SpO2 100 % 11/14/20 0854  Vitals shown include unvalidated device data.  Last Pain:  Vitals:   11/14/20 0851  TempSrc: Temporal  PainSc: 0-No pain         Complications: No complications documented.

## 2020-11-14 NOTE — Anesthesia Procedure Notes (Signed)
Date/Time: 11/14/2020 8:25 AM Performed by: Allean Found, CRNA Pre-anesthesia Checklist: Patient identified, Emergency Drugs available, Suction available, Patient being monitored and Timeout performed Patient Re-evaluated:Patient Re-evaluated prior to induction Oxygen Delivery Method: Nasal cannula Placement Confirmation: positive ETCO2

## 2020-11-14 NOTE — Telephone Encounter (Signed)
-----   Message from Lin Landsman, MD sent at 11/14/2020 12:01 PM EST ----- She does have esophageal candidiasis.  Recommend oral fluconazole 400 mg on day 1 followed by 200 mg for the next 13 days  Rohini Vanga

## 2020-11-14 NOTE — Op Note (Signed)
[facility] Gastroenterology Patient Name: Kimberly Mercer Procedure Date: 11/14/2020 7:40 AM MRN: [id number] Account #: [id number] Date of Birth: 14-Nov-1945 Admit Type: Outpatient Age: 75 Room: [facility] ENDO ROOM 3 Gender: Female Note Status: Finalized Procedure:             Upper GI endoscopy Indications:           Generalized abdominal pain, Esophageal reflux symptoms                         that persist despite appropriate therapy, Weight loss Providers:             Lin Landsman MD, MD Referring MD:          Mar Daring (Referring MD) Medicines:             General Anesthesia Complications:         No immediate complications. Estimated blood loss: None. Procedure:             Pre-Anesthesia Assessment:                        - Prior to the procedure, a History and Physical was                         performed, and patient medications and allergies were                         reviewed. The patient is competent. The risks and                         benefits of the procedure and the sedation options and                         risks were discussed with the patient. All questions                         were answered and informed consent was obtained.                         Patient identification and proposed procedure were                         verified by the physician, the nurse, the                         anesthesiologist, the anesthetist and the technician                         in the pre-procedure area in the procedure room in the                         endoscopy suite. Mental Status Examination: alert and                         oriented. Airway Examination: normal oropharyngeal                         airway and neck mobility. Respiratory Examination:  clear to auscultation. CV Examination: normal.                         Prophylactic Antibiotics: The patient does not require                         prophylactic  antibiotics. Prior Anticoagulants: The                         patient has taken no previous anticoagulant or                         antiplatelet agents. ASA Grade Assessment: II - A                         patient with mild systemic disease. After reviewing                         the risks and benefits, the patient was deemed in                         satisfactory condition to undergo the procedure. The                         anesthesia plan was to use general anesthesia.                         Immediately prior to administration of medications,                         the patient was re-assessed for adequacy to receive                         sedatives. The heart rate, respiratory rate, oxygen                         saturations, blood pressure, adequacy of pulmonary                         ventilation, and response to care were monitored                         throughout the procedure. The physical status of the                         patient was re-assessed after the procedure.                        After obtaining informed consent, the endoscope was                         passed under direct vision. Throughout the procedure,                         the patient's blood pressure, pulse, and oxygen                         saturations were monitored continuously. The Endoscope  was introduced through the mouth, and advanced to the                         second part of duodenum. The upper GI endoscopy was                         accomplished without difficulty. The patient tolerated                         the procedure well. Findings:      The duodenal bulb and second portion of the duodenum were normal.       Biopsies were taken with a cold forceps for histology.      The entire examined stomach was normal. Biopsies were taken with a cold       forceps for Helicobacter pylori testing.      The cardia and gastric fundus were normal on retroflexion.       Esophagogastric landmarks were identified: the gastroesophageal junction       was found at 38 cm from the incisors.      White nummular lesions were noted in the entire esophagus. Cells for       cytology were obtained by brushing. Biopsies were taken with a cold       forceps for histology. Impression:            - Normal duodenal bulb and second portion of the                         duodenum. Biopsied.                        - Normal stomach. Biopsied.                        - Esophagogastric landmarks identified.                        - White nummular lesions in esophageal mucosa. Cells                         for cytology obtained. Biopsied. Recommendation:        - Await pathology results.                        - Discharge patient to home (with escort).                        - Resume previous diet today.                        - Continue present medications. Procedure Code(s):     --- Professional ---                        [phone], Esophagogastroduodenoscopy, flexible,                         transoral; with biopsy, single or multiple Diagnosis Code(s):     --- Professional ---                        K22.8, Other specified diseases  of esophagus                        R10.84, Generalized abdominal pain                        K21.9, Gastro-esophageal reflux disease without                         esophagitis                        R63.4, Abnormal weight loss CPT copyright 2019 American Medical Association. All rights reserved. The codes documented in this report are preliminary and upon coder review may  be revised to meet current compliance requirements. Dr. Ulyess Mort Lin Landsman MD, MD 11/14/2020 8:46:06 AM This report has been signed electronically. Number of Addenda: 0 Note Initiated On: 11/14/2020 7:40 AM Estimated Blood Loss:  Estimated blood loss: none.      [facility]

## 2020-11-14 NOTE — Telephone Encounter (Signed)
Patient verbalized understanding of results. Sent medication to the pharmacy  

## 2020-11-14 NOTE — Anesthesia Preprocedure Evaluation (Signed)
Anesthesia Evaluation  Patient identified by MRN, date of birth, ID band Patient awake    Reviewed: Allergy & Precautions, H&P , NPO status , reviewed documented beta blocker date and time   Airway Mallampati: II  TM Distance: >3 FB Neck ROM: full    Dental  (+) Teeth Intact   Pulmonary asthma ,    Pulmonary exam normal        Cardiovascular Normal cardiovascular exam  08/2020 Cardiac Cath  Normal Coronaries  Normal LVF  Normal cath   Conclusion Normal LVF Normal Wall motion EF=60%    Neuro/Psych    GI/Hepatic GERD  ,  Endo/Other  Hypothyroidism   Renal/GU      Musculoskeletal  (+) Arthritis ,   Abdominal   Peds  Hematology   Anesthesia Other Findings Past Medical History: No date: Allergic rhinitis, cause unspecified No date: Allergy No date: Asthma No date: Benign neoplasm of colon No date: Cancer ([place]) No date: Candidiasis of the esophagus No date: Chicken pox No date: Diverticulitis of colon (without mention of hemorrhage)(562. 11) No date: E coli enteritis No date: GERD (gastroesophageal reflux disease) No date: Hemorrhoids     Comment:  internal No date: Measles No date: Osteoarthrosis, unspecified whether generalized or localized,  unspecified site No date: Symptomatic menopausal or female climacteric states No date: Unspecified asthma(493.90) No date: Unspecified hypothyroidism  Past Surgical History: No date: ABDOMINAL HYSTERECTOMY 12/09/2008: cardiac catherization     Comment:  normal coranaries,normal EF. No date: CARDIAC CATHETERIZATION No date: dilatation and curettage     Comment:  due to SAB x 2 No date: DILATION AND CURETTAGE OF UTERUS 08/25/2020: LEFT HEART CATH AND CORONARY ANGIOGRAPHY; N/A     Comment:  Procedure: LEFT HEART CATH AND CORONARY ANGIOGRAPHY;                Surgeon: Yolonda Kida, MD;  Location: [facility]              CV LAB;  Service:  Cardiovascular;  Laterality: N/A; 1990: thyroid nodule     Comment:  benign 2003: TOTAL ABDOMINAL HYSTERECTOMY W/ BILATERAL SALPINGOOPHORECTOMY     Comment:  complex endometrial hyperplasia, DUB, fibroids No date: WISDOM TOOTH EXTRACTION  BMI    Body Mass Index: 23.23 kg/m      Reproductive/Obstetrics                             Anesthesia Physical Anesthesia Plan  ASA: II  Anesthesia Plan: General   Post-op Pain Management:    Induction: Intravenous  PONV Risk Score and Plan: Treatment may vary due to age or medical condition and TIVA  Airway Management Planned: Nasal Cannula and Natural Airway  Additional Equipment:   Intra-op Plan:   Post-operative Plan:   Informed Consent: I have reviewed the patients History and Physical, chart, labs and discussed the procedure including the risks, benefits and alternatives for the proposed anesthesia with the patient or authorized representative who has indicated his/her understanding and acceptance.     Dental Advisory Given  Plan Discussed with: CRNA  Anesthesia Plan Comments:         Anesthesia Quick Evaluation

## 2020-11-14 NOTE — Anesthesia Postprocedure Evaluation (Signed)
Anesthesia Post Note  Patient: Kimberly Mercer  Procedure(s) Performed: ESOPHAGOGASTRODUODENOSCOPY (EGD) WITH PROPOFOL (N/A )  Patient location during evaluation: Endoscopy Anesthesia Type: General Level of consciousness: awake and alert Pain management: pain level controlled Vital Signs Assessment: post-procedure vital signs reviewed and stable Respiratory status: spontaneous breathing, nonlabored ventilation and respiratory function stable Cardiovascular status: blood pressure returned to baseline and stable Postop Assessment: no apparent nausea or vomiting Anesthetic complications: no   No complications documented.   Last Vitals:  Vitals:   11/14/20 0921 11/14/20 0923  BP: (!) 139/119 137/70  Pulse: (!) 51 (!) 54  Resp: 11 14  Temp:    SpO2: 99% 100%    Last Pain:  Vitals:   11/14/20 0923  TempSrc:   PainSc: 0-No pain                 Alphonsus Sias

## 2020-11-14 NOTE — H&P (Signed)
Arlyss Repress, MD [address]  [address]  [place], [place] [address]  Main: [phone]  Fax: [phone] Pager: [phone]  Primary Care Physician:  Margaretann Loveless, PA-C Primary Gastroenterologist:  Dr. Arlyss Repress  Pre-Procedure History & Physical: HPI:  Kimberly Mercer is a 75 y.o. female is here for an endoscopy.   Past Medical History:  Diagnosis Date  . Allergic rhinitis, cause unspecified   . Allergy   . Asthma   . Benign neoplasm of colon   . Cancer (HCC)   . Candidiasis of the esophagus   . Chicken pox   . Diverticulitis of colon (without mention of hemorrhage)(562.11)   . E coli enteritis   . GERD (gastroesophageal reflux disease)   . Hemorrhoids    internal  . Measles   . Osteoarthrosis, unspecified whether generalized or localized, unspecified site   . Symptomatic menopausal or female climacteric states   . Unspecified asthma(493.90)   . Unspecified hypothyroidism     Past Surgical History:  Procedure Laterality Date  . ABDOMINAL HYSTERECTOMY    . cardiac catherization  12/09/2008   normal coranaries,normal EF.  Marland Kitchen CARDIAC CATHETERIZATION    . dilatation and curettage     due to SAB x 2  . DILATION AND CURETTAGE OF UTERUS    . LEFT HEART CATH AND CORONARY ANGIOGRAPHY N/A 08/25/2020   Procedure: LEFT HEART CATH AND CORONARY ANGIOGRAPHY;  Surgeon: Alwyn Pea, MD;  Location: [facility] INVASIVE CV LAB;  Service: Cardiovascular;  Laterality: N/A;  . thyroid nodule  1990   benign  . TOTAL ABDOMINAL HYSTERECTOMY W/ BILATERAL SALPINGOOPHORECTOMY  2003   complex endometrial hyperplasia, DUB, fibroids  . WISDOM TOOTH EXTRACTION      Prior to Admission medications   Medication Sig Start Date End Date Taking? Authorizing Provider  acetaminophen (TYLENOL) 650 MG CR tablet Take 650 mg by mouth every 8 (eight) hours as needed for pain.    Yes [provider]  albuterol (PROVENTIL) (2.5 MG/3ML) 0.083% nebulizer solution Take 3 mLs  (2.5 mg total) by nebulization every 6 (six) hours as needed for wheezing or shortness of breath. Dx:J45.909 09/21/19  Yes Salena Saner, MD  albuterol (VENTOLIN HFA) 108 (90 Base) MCG/ACT inhaler TAKE 2 PUFFS BY MOUTH EVERY 6 HOURS AS NEEDED FOR WHEEZE OR SHORTNESS OF BREATH 09/18/20  Yes Margaretann Loveless, PA-C  aspirin EC 81 MG tablet Take 81 mg by mouth every evening. Swallow whole.   Yes [provider]  Calcium-Magnesium-Zinc (CAL-MAG-ZINC PO) Take 1 tablet by mouth daily.   Yes [provider]  CANNABIDIOL PO Take 30 mg by mouth daily. Capsule   Yes [provider]  cholecalciferol (VITAMIN D3) 25 MCG (1000 UNIT) tablet Take 1,000 Units by mouth daily.   Yes [provider]  diclofenac sodium (VOLTAREN) 1 % GEL Apply 2 g topically 4 (four) times daily as needed (pain.).  03/31/18  Yes [provider]  diphenhydrAMINE (BENADRYL) 25 MG tablet Take 25 mg by mouth every 6 (six) hours as needed (allergies.).   Yes [provider]  fluticasone (FLONASE) 50 MCG/ACT nasal spray SPRAY 2 SPRAYS INTO EACH NOSTRIL EVERY DAY 09/18/20  Yes Joycelyn Man M, PA-C  fluticasone (FLOVENT HFA) 220 MCG/ACT inhaler TAKE 1 PUFF BY MOUTH TWICE A DAY 09/18/20  Yes Joycelyn Man M, PA-C  ibuprofen (ADVIL) 200 MG tablet Take 200 mg by mouth every 8 (eight) hours as needed (pain.).   Yes [provider]  levothyroxine (SYNTHROID) 75 MCG tablet TAKE 1 TABLET BY MOUTH EVERY DAY ON AN EMPTY STOMACH 09/18/20  Yes Mar Daring, PA-C  Multiple Vitamin (MULTIVITAMIN WITH MINERALS) TABS tablet Take 1 tablet by mouth daily.   Yes [provider]  NONFORMULARY OR COMPOUNDED ITEM Place 0.5 g vaginally every other day. At night (Estradiol 1mg /gm cream)   Yes [provider]  omeprazole (PRILOSEC) 40 MG capsule TAKE 1 CAPSULE (40 MG TOTAL) BY MOUTH 2 (TWO) TIMES DAILY BEFORE A MEAL. 11/03/20  Yes Monnie Gudgel, Tally Due, MD   Spacer/Aero-Holding Chambers (AEROCHAMBER MV) inhaler Use as instructed 09/06/19  Yes Tyler Pita, MD  vitamin E 180 MG (400 UNITS) capsule Take 400 Units by mouth daily.    Yes [provider]  montelukast (SINGULAIR) 10 MG tablet TAKE 1 TABLET BY MOUTH EVERY DAY IN THE EVENING Patient taking differently: Take 10 mg by mouth every evening. 05/08/20   Mar Daring, PA-C  pseudoephedrine (SUDAFED) 60 MG tablet Take 30 mg by mouth every 4 (four) hours as needed (allergies.).     [provider]    Allergies as of 11/02/2020 - Review Complete 11/02/2020  Allergen Reaction Noted  . Claritin [loratadine] Other (See Comments) 07/04/2012  . Fructose Diarrhea 03/12/2016  . Gluten meal Other (See Comments) 09/23/2012  . Ipratropium Nausea And Vomiting 08/20/2020  . Lactase Other (See Comments) 02/23/2014  . Levaquin [levofloxacin] Nausea Only 07/04/2012  . Sulfa drugs cross reactors Hives 07/04/2012    Family History  Problem Relation Age of Onset  . Heart disease Mother        CAD  . Stroke Mother        x 2  . Mental illness Mother        not diagnosed  . Irritable bowel syndrome Mother   . Heart disease Father        CAD  . Parkinsonism Father   . Cancer Sister        Breast  . Cancer Other        Breast and Ovarian  . Heart disease Brother   . Schizophrenia Sister   . Lung disease Sister     Social History   Socioeconomic History  . Marital status: Married    Spouse name: Not on file  . Number of children: 2  . Years of education: masters  . Highest education level: Master's degree (e.g., MA, MS, MEng, MEd, MSW, MBA)  Occupational History  . Occupation: Retired    Comment: Pharmacist, hospital  Tobacco Use  . Smoking status: Never Smoker  . Smokeless tobacco: Never Used  Vaping Use  . Vaping Use: Never used  Substance and Sexual Activity  . Alcohol use: No  . Drug use: No  . Sexual activity: Not on file  Other Topics Concern  . Not on file   Social History Narrative   Always uses seat belts. Smoke alarm in the home. No caffeine use. 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 Married x 15 years;3rd marriage. Happily married. Husband with bladder cancer and possible gastric cancer 2012-2013. Has 2 children, 2 grandchildren.   Social Determinants of Health   Financial Resource Strain: Not on file  Food Insecurity: Not on file  Transportation Needs: Not on file  Physical Activity: Not on file  Stress: Not on file  Social Connections: Not on file  Intimate Partner Violence: Not on file    Review of Systems:  See HPI, otherwise negative ROS  Physical Exam: BP 111/67   Pulse (!) 59   Temp (!) 97.4 F (36.3 C) (Temporal)   Resp 16   Ht 5\' 7"  (1.702 m)   Wt 67.3 kg   SpO2 99%   BMI 23.23 kg/m  General:   Alert,  pleasant and cooperative in NAD Head:  Normocephalic and atraumatic. Neck:  Supple; no masses or thyromegaly. Lungs:  Clear throughout to auscultation.    Heart:  Regular rate and rhythm. Abdomen:  Soft, nontender and nondistended. Normal bowel sounds, without guarding, and without rebound.   Neurologic:  Alert and  oriented x4;  grossly normal neurologically.  Impression/Plan: Kimberly Mercer is here for an endoscopy to be performed for weight loss, gerd, altered bowel habits  Risks, benefits, limitations, and alternatives regarding  endoscopy have been reviewed with the patient.  Questions have been answered.  All parties agreeable.   [name], MD  11/14/2020, 8:17 AM

## 2020-11-15 ENCOUNTER — Encounter: Payer: Self-pay | Admitting: Gastroenterology

## 2020-11-15 LAB — SURGICAL PATHOLOGY

## 2020-11-17 ENCOUNTER — Other Ambulatory Visit: Payer: Self-pay

## 2020-11-17 ENCOUNTER — Ambulatory Visit
Admission: RE | Admit: 2020-11-17 | Discharge: 2020-11-17 | Disposition: A | Discharge status: Home / Self Care | Payer: Medicare Other | Source: Ambulatory Visit | Attending: Gastroenterology | Admitting: Gastroenterology

## 2020-11-17 DIAGNOSIS — R634 Abnormal weight loss: Secondary | ICD-10-CM | POA: Insufficient documentation

## 2020-11-17 DIAGNOSIS — R1010 Upper abdominal pain, unspecified: Secondary | ICD-10-CM | POA: Diagnosis not present

## 2020-11-17 DIAGNOSIS — R1110 Vomiting, unspecified: Secondary | ICD-10-CM | POA: Diagnosis not present

## 2020-11-17 DIAGNOSIS — R109 Unspecified abdominal pain: Secondary | ICD-10-CM | POA: Diagnosis not present

## 2020-11-17 MED ORDER — IOHEXOL 300 MG/ML  SOLN
100.0000 mL | Freq: Once | INTRAMUSCULAR | Status: AC | PRN
  Administered 2020-11-17: 100 mL via INTRAVENOUS

## 2020-11-21 ENCOUNTER — Encounter: Payer: Self-pay | Admitting: Physician Assistant

## 2020-11-21 ENCOUNTER — Other Ambulatory Visit: Payer: Self-pay

## 2020-11-21 ENCOUNTER — Ambulatory Visit (INDEPENDENT_AMBULATORY_CARE_PROVIDER_SITE_OTHER): Payer: Medicare Other | Admitting: Physician Assistant

## 2020-11-21 VITALS — BP 113/58 | HR 77 | Temp 97.7°F | Wt 137.0 lb

## 2020-11-21 DIAGNOSIS — Z23 Encounter for immunization: Secondary | ICD-10-CM | POA: Diagnosis not present

## 2020-11-21 DIAGNOSIS — N289 Disorder of kidney and ureter, unspecified: Secondary | ICD-10-CM | POA: Diagnosis not present

## 2020-11-21 DIAGNOSIS — K769 Liver disease, unspecified: Secondary | ICD-10-CM

## 2020-11-21 DIAGNOSIS — M4316 Spondylolisthesis, lumbar region: Secondary | ICD-10-CM | POA: Diagnosis not present

## 2020-11-21 DIAGNOSIS — I700 Atherosclerosis of aorta: Secondary | ICD-10-CM | POA: Diagnosis not present

## 2020-11-21 NOTE — Progress Notes (Signed)
Established patient visit   Patient: Kimberly Mercer   DOB: 1946/03/29   75 y.o. Female  MRN: [id number] Visit Date: 11/21/2020  Today's healthcare provider: Mar Daring, PA-C   Chief Complaint  Patient presents with  . Follow-up    Discuss results from GI    Subjective    HPI HPI    Follow-up     Additional comments: Discuss results from GI        Last edited by Kizzie Furnish, CMA on 11/21/2020  1:36 PM. (History)    Reports was having some GI issues and has underwent a lot of testing with Dr. Marius Ditch, GI. She was eventually diagnosed with yeast esophagitis. She would like to discuss incidental CT findings.    Also needs a pneumonia vaccine  Patient Active Problem List   Diagnosis Date Noted  . Syncope 04/30/2018  . Chronic midline low back pain 03/31/2018  . Primary osteoarthritis of both first carpometacarpal joints 12/18/2015  . Thyroid disease 01/03/2014  . Menopause 07/06/2012  . Asthma 07/06/2012  . GERD (gastroesophageal reflux disease) 07/06/2012  . Allergic rhinitis 07/06/2012  . Varicose veins 07/06/2012   Past Medical History:  Diagnosis Date  . Allergic rhinitis, cause unspecified   . Allergy   . Asthma   . Benign neoplasm of colon   . Cancer ([place])   . Candidiasis of the esophagus   . Chicken pox   . Diverticulitis of colon (without mention of hemorrhage)(562.11)   . E coli enteritis   . GERD (gastroesophageal reflux disease)   . Hemorrhoids    internal  . Measles   . Osteoarthrosis, unspecified whether generalized or localized, unspecified site   . Symptomatic menopausal or female climacteric states   . Unspecified asthma(493.90)   . Unspecified hypothyroidism    Social History   Tobacco Use  . Smoking status: Never Smoker  . Smokeless tobacco: Never Used  Vaping Use  . Vaping Use: Never used  Substance Use Topics  . Alcohol use: No  . Drug use: No   Allergies  Allergen Reactions  . Claritin [Loratadine] Other (See  Comments)    Panic attacks  . Fructose Diarrhea  . Gluten Meal Other (See Comments)    GI UPSET - CRAMPING  . Ipratropium Nausea And Vomiting    (nebulizer solution) Nausea/indigestion  . Lactase Other (See Comments)    Pain, bloating abdominal pain  . Levaquin [Levofloxacin] Nausea Only    Yeast infection  . Sulfa Drugs Cross Reactors Hives    Childhood reaction     Medications: Outpatient Medications Prior to Visit  Medication Sig  . acetaminophen (TYLENOL) 650 MG CR tablet Take 650 mg by mouth every 8 (eight) hours as needed for pain.   Marland Kitchen albuterol (PROVENTIL) (2.5 MG/3ML) 0.083% nebulizer solution Take 3 mLs (2.5 mg total) by nebulization every 6 (six) hours as needed for wheezing or shortness of breath. Dx:J45.909  . albuterol (VENTOLIN HFA) 108 (90 Base) MCG/ACT inhaler TAKE 2 PUFFS BY MOUTH EVERY 6 HOURS AS NEEDED FOR WHEEZE OR SHORTNESS OF BREATH  . aspirin EC 81 MG tablet Take 81 mg by mouth every evening. Swallow whole.  . Calcium-Magnesium-Zinc (CAL-MAG-ZINC PO) Take 1 tablet by mouth daily.  Marland Kitchen CANNABIDIOL PO Take 30 mg by mouth daily. Capsule  . cholecalciferol (VITAMIN D3) 25 MCG (1000 UNIT) tablet Take 1,000 Units by mouth daily.  . diclofenac sodium (VOLTAREN) 1 % GEL Apply 2 g topically 4 (four) times  daily as needed (pain.).   Marland Kitchen diphenhydrAMINE (BENADRYL) 25 MG tablet Take 25 mg by mouth every 6 (six) hours as needed (allergies.).  Marland Kitchen fluconazole (DIFLUCAN) 200 MG tablet Take 2 tablets (400mg ) on day 1 and then 1 tablet daily for 13 days  . fluticasone (FLONASE) 50 MCG/ACT nasal spray SPRAY 2 SPRAYS INTO EACH NOSTRIL EVERY DAY  . fluticasone (FLOVENT HFA) 220 MCG/ACT inhaler TAKE 1 PUFF BY MOUTH TWICE A DAY  . ibuprofen (ADVIL) 200 MG tablet Take 200 mg by mouth every 8 (eight) hours as needed (pain.).  Marland Kitchen levothyroxine (SYNTHROID) 75 MCG tablet TAKE 1 TABLET BY MOUTH EVERY DAY ON AN EMPTY STOMACH  . montelukast (SINGULAIR) 10 MG tablet TAKE 1 TABLET BY MOUTH EVERY DAY  IN THE EVENING (Patient taking differently: Take 10 mg by mouth every evening.)  . Multiple Vitamin (MULTIVITAMIN WITH MINERALS) TABS tablet Take 1 tablet by mouth daily.  . NONFORMULARY OR COMPOUNDED ITEM Place 0.5 g vaginally every other day. At night (Estradiol 1mg /gm cream)  . omeprazole (PRILOSEC) 40 MG capsule TAKE 1 CAPSULE (40 MG TOTAL) BY MOUTH 2 (TWO) TIMES DAILY BEFORE A MEAL.  Marland Kitchen pseudoephedrine (SUDAFED) 60 MG tablet Take 30 mg by mouth every 4 (four) hours as needed (allergies.).   Marland Kitchen Spacer/Aero-Holding Chambers (AEROCHAMBER MV) inhaler Use as instructed  . vitamin E 180 MG (400 UNITS) capsule Take 400 Units by mouth daily.    No facility-administered medications prior to visit.    Review of Systems  Constitutional: Negative.   HENT: Negative.   Respiratory: Negative.   Cardiovascular: Negative.   Gastrointestinal: Positive for abdominal distention and constipation.  Neurological: Negative.         Objective    BP (!) 113/58 (BP Location: Left Arm, Patient Position: Sitting, Cuff Size: Large)   Pulse 77   Temp 97.7 F (36.5 C) (Oral)   Wt 137 lb (62.1 kg)   SpO2 99%   BMI 21.46 kg/m    Physical Exam Vitals reviewed.  Constitutional:      General: She is not in acute distress.    Appearance: Normal appearance. She is well-developed, normal weight and well-nourished. She is not ill-appearing.  HENT:     Head: Normocephalic and atraumatic.  Eyes:     Extraocular Movements: EOM normal.  Pulmonary:     Effort: Pulmonary effort is normal. No respiratory distress.  Musculoskeletal:     Cervical back: Normal range of motion and neck supple.  Neurological:     Mental Status: She is alert.  Psychiatric:        Mood and Affect: Mood and affect and mood normal.        Behavior: Behavior normal.        Thought Content: Thought content normal.        Judgment: Judgment normal.    CLINICAL DATA:  Epigastric abdominal pain with unintentional weight loss and  bloating. Vomiting.  EXAM: CT ABDOMEN AND PELVIS WITH CONTRAST  TECHNIQUE: Multidetector CT imaging of the abdomen and pelvis was performed using the standard protocol following bolus administration of intravenous contrast.  CONTRAST:  146mL OMNIPAQUE IOHEXOL 300 MG/ML  SOLN  COMPARISON:  Contrast medium in the distal esophagus potentially from dysmotility or reflux.  FINDINGS: Lower chest: Descending thoracic aortic atherosclerotic calcification. Mild dependent subsegmental atelectasis in the right lower lobe.  Hepatobiliary: 0.7 by 0.5 cm hypodense lesion in segment 2 of the liver on image 11 series 2, technically nonspecific although statistically likely to  be benign and incidental. Similar 0.7 cm lesion in segment 4 just above the gallbladder fossa on image 23 series 2. Mildly contracted gallbladder but otherwise unremarkable. No biliary dilatation.  Pancreas: Unremarkable  Spleen: Unremarkable  Adrenals/Urinary Tract: 0.8 cm hypodense exophytic lesion from the left kidney upper pole posteriorly on image 21 of series 2, likely a cyst although technically nonspecific due to small size. The adrenal glands appear normal. 0.6 cm nonspecific hypodense lesion in the right mid kidney on image 25 series 9. Urinary bladder unremarkable.  Stomach/Bowel: Prominent stool throughout the colon favors constipation. Normal appendix. Sigmoid colon diverticulosis without compelling findings of active diverticulitis.  Vascular/Lymphatic: Aortoiliac atherosclerotic vascular disease.  Reproductive: Uterus absent.  Adnexa unremarkable.  Other: No supplemental non-categorized findings.  Musculoskeletal: Grade 1 degenerative anterolisthesis of L2 on L3 with associated degenerative facet arthropathy. Schmorl's nodes along the inferior endplates of L2 and L3.  IMPRESSION: 1. Prominent stool throughout the colon favors constipation. 2. Sigmoid colon diverticulosis without  compelling findings of active diverticulitis. 3. Several small hypodense lesions in the liver and kidneys are technically nonspecific although statistically likely to be benign and incidental. 4. 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 5. Aortic atherosclerosis.  Aortic Atherosclerosis (ICD10-I70.0).   Electronically Signed   By: Van Clines M.D.   On: 11/17/2020 17:47  No results found for any visits on 11/21/20.  Assessment & Plan     1. Atherosclerosis of abdominal aorta (HCC) Noted on CT scan. Diet controlled. Will follow up with Cardiology.   2. Anterolisthesis of lumbar spine Noted at L2-L3. Does occasionally cause issues but patient has exercises that help with issue. Will continue conservative management with exercises and stretches.   3. Hepatic lesion 2 small hypodense lesions noted incidentally and expected to be benign cyst most likely.   4. Kidney lesion One on upper left and one in mid right kidney, both small, hypodense lesions suspected to be benign cyst.   5. Need for pneumococcal vaccination Pneumococcal 23 Vaccine given to patient without complications. Patient sat for 15 minutes after administration and was tolerated well without adverse effects. - Pneumococcal polysaccharide vaccine 23-valent greater than or equal to 2yo subcutaneous/IM   No follow-ups on file.      Reynolds Bowl, PA-C, have reviewed all documentation for this visit. The documentation on 11/21/20 for the exam, diagnosis, procedures, and orders are all accurate and complete.   Rubye Beach  [facility] [phone] (phone) [phone] (fax)  [place]

## 2020-11-23 ENCOUNTER — Encounter: Payer: Self-pay | Admitting: Pulmonary Disease

## 2020-11-23 ENCOUNTER — Other Ambulatory Visit
Admission: RE | Admit: 2020-11-23 | Discharge: 2020-11-23 | Disposition: A | Discharge status: Home / Self Care | Payer: Medicare Other | Attending: Pulmonary Disease | Admitting: Pulmonary Disease

## 2020-11-23 ENCOUNTER — Telehealth: Payer: Self-pay

## 2020-11-23 ENCOUNTER — Ambulatory Visit (INDEPENDENT_AMBULATORY_CARE_PROVIDER_SITE_OTHER): Payer: Medicare Other | Admitting: Pulmonary Disease

## 2020-11-23 ENCOUNTER — Other Ambulatory Visit: Payer: Self-pay

## 2020-11-23 VITALS — BP 128/78 | HR 71 | Temp 97.0°F | Ht 67.0 in | Wt 135.8 lb

## 2020-11-23 DIAGNOSIS — J3089 Other allergic rhinitis: Secondary | ICD-10-CM | POA: Diagnosis not present

## 2020-11-23 DIAGNOSIS — J4530 Mild persistent asthma, uncomplicated: Secondary | ICD-10-CM

## 2020-11-23 DIAGNOSIS — Z5181 Encounter for therapeutic drug level monitoring: Secondary | ICD-10-CM | POA: Insufficient documentation

## 2020-11-23 DIAGNOSIS — R928 Other abnormal and inconclusive findings on diagnostic imaging of breast: Secondary | ICD-10-CM

## 2020-11-23 DIAGNOSIS — B3781 Candidal esophagitis: Secondary | ICD-10-CM

## 2020-11-23 LAB — HEPATIC FUNCTION PANEL
ALT: 24 U/L (ref 0–44)
AST: 28 U/L (ref 15–41)
Albumin: 4.2 g/dL (ref 3.5–5.0)
Alkaline Phosphatase: 68 U/L (ref 38–126)
Bilirubin, Direct: <0.1 mg/dL (ref 0.0–0.2)
Total Bilirubin: 0.4 mg/dL (ref 0.3–1.2)
Total Protein: 7.1 g/dL (ref 6.5–8.1)

## 2020-11-23 MED ORDER — AZELASTINE HCL 0.1 % NA SOLN: 1.0000 | Freq: Two times a day (BID) | NASAL | 6 refills | Status: DC

## 2020-11-23 MED ORDER — CETIRIZINE HCL 10 MG PO TABS: 10.0000 mg | ORAL_TABLET | Freq: Every day | ORAL | 6 refills | Status: DC

## 2020-11-23 MED ORDER — ZAFIRLUKAST 20 MG PO TABS: 20.0000 mg | ORAL_TABLET | Freq: Two times a day (BID) | ORAL | 6 refills | Status: DC

## 2020-11-23 NOTE — Patient Instructions (Addendum)
We have switch your Singulair to Accolate (zafirlukast) 1 tablet twice a day  Continue using albuterol as needed, let us know if your need for this medication increases  We have sent prescriptions in for the Zyrtec (cetirizine), Accolate and azelastine nasal spray which will replace your Flonase  We will see you in follow-up in 4 to 6 weeks time call sooner should any new problems arise  Blood test today to check liver functions we will monitor this after you come back

## 2020-11-23 NOTE — Progress Notes (Signed)
Subjective:    Patient ID: Kimberly Mercer, female    DOB: 1946-08-14, 75 y.o.   MRN: [id number]  HPI Kimberly Mercer is a 75 year old lifelong never smoker who follows here for the issue of asthma.  Followed with Dr. Ashby Dawes, I have evaluated her 1 time previously on 06 September 2019 but she did not follow-up after that.  She states that she had been maintaining her asthma "at [place]" and doing well.  She had been on Flovent maintenance inhaler.  She has been tested for alpha-1 previously and this was normal.  Today she presents to see if there is a substitute for Flovent inhaler.  She has developed issues with significant esophageal candidiasis. This has been debilitating for her.  She apparently had a similar episode in 2015.  Undergone EGD which documented these issues.  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  HIV testing but apparently did have it in 2015 when her original issue arose.  She states she has only had one partner since then.  Her CBC does not show lymphopenia.  Immunoglobulins have been noted to be normal a year ago.  IgE was 15 previously, only sensitivity to ragweed and plant mold noted.  Her dysphagia has improved with management of her esophageal candidiasis with fluconazole.  She is not only on Flovent inhaler but also on fluticasone nasal spray.  She states that she rinses her mouth well after use.  With regards to her asthma she does have issues year-round but does note spike of symptoms with change of seasons.  She is also concerned because a CT abdomen and pelvis performed on 17 November 2020 showed some mild atelectasis on the right base.  I reviewed the films with the patient.  This is actually very minimal/peripheral atelectasis noted with being recumbent on the CT table.  Pulmonary Functions Testing Results: 08/19/2019 FEV1 2.46 L or 97% predicted, FVC 3.46 L or 103% predicted, FEV1/FVC 71%.  Lung volumes normal.Diffusion capacity  normal.  Minimal air trapping.  In essence normal PFTs.   Review of Systems A 10 point review of systems was performed and it is as noted above otherwise negative.  Past Medical History:  Diagnosis Date  . Allergic rhinitis, cause unspecified   . Allergy   . Asthma   . Benign neoplasm of colon   . Cancer ([place])   . Candidiasis of the esophagus   . Chicken pox   . Diverticulitis of colon (without mention of hemorrhage)(562.11)   . E coli enteritis   . GERD (gastroesophageal reflux disease)   . Hemorrhoids    internal  . Measles   . Osteoarthrosis, unspecified whether generalized or localized, unspecified site   . Symptomatic menopausal or female climacteric states   . Unspecified asthma(493.90)   . Unspecified hypothyroidism    Past Surgical History:  Procedure Laterality Date  . ABDOMINAL HYSTERECTOMY    . cardiac catherization  12/09/2008   normal coranaries,normal EF.  Marland Kitchen CARDIAC CATHETERIZATION    . dilatation and curettage     due to SAB x 2  . DILATION AND CURETTAGE OF UTERUS    . ESOPHAGOGASTRODUODENOSCOPY (EGD) WITH PROPOFOL N/A 11/14/2020   Procedure: ESOPHAGOGASTRODUODENOSCOPY (EGD) WITH PROPOFOL;  Surgeon: Lin Landsman, MD;  Location: [facility];  Service: Gastroenterology;  Laterality: N/A;  . LEFT HEART CATH AND CORONARY ANGIOGRAPHY N/A 08/25/2020   Procedure: LEFT HEART CATH AND CORONARY ANGIOGRAPHY;  Surgeon: Yolonda Kida, MD;  Location: [facility];  Service: Cardiovascular;  Laterality: N/A;  . thyroid nodule  1990   benign  . TOTAL ABDOMINAL HYSTERECTOMY W/ BILATERAL SALPINGOOPHORECTOMY  2003   complex endometrial hyperplasia, DUB, fibroids  . WISDOM TOOTH EXTRACTION     Family History  Problem Relation Age of Onset  . Heart disease Mother        CAD  . Stroke Mother        x 2  . Mental illness Mother        not diagnosed  . Irritable bowel syndrome Mother   . Heart disease Father        CAD  . Parkinsonism Father   .  Cancer Sister        Breast  . Cancer Other        Breast and Ovarian  . Heart disease Brother   . Schizophrenia Sister   . Lung disease Sister    Social History   Tobacco Use  . Smoking status: Never Smoker  . Smokeless tobacco: Never Used  Substance Use Topics  . Alcohol use: No   Allergies  Allergen Reactions  . Claritin [Loratadine] Other (See Comments)    Panic attacks  . Fructose Diarrhea  . Gluten Meal Other (See Comments)    GI UPSET - CRAMPING  . Ipratropium Nausea And Vomiting    (nebulizer solution) Nausea/indigestion  . Lactase Other (See Comments)    Pain, bloating abdominal pain  . Levaquin [Levofloxacin] Nausea Only    Yeast infection  . Sulfa Drugs Cross Reactors Hives    Childhood reaction   Current Meds  Medication Sig  . acetaminophen (TYLENOL) 650 MG CR tablet Take 650 mg by mouth every 8 (eight) hours as needed for pain.   Marland Kitchen albuterol (PROVENTIL) (2.5 MG/3ML) 0.083% nebulizer solution Take 3 mLs (2.5 mg total) by nebulization every 6 (six) hours as needed for wheezing or shortness of breath. Dx:J45.909  . albuterol (VENTOLIN HFA) 108 (90 Base) MCG/ACT inhaler TAKE 2 PUFFS BY MOUTH EVERY 6 HOURS AS NEEDED FOR WHEEZE OR SHORTNESS OF BREATH  . aspirin EC 81 MG tablet Take 81 mg by mouth every evening. Swallow whole.  . Calcium-Magnesium-Zinc (CAL-MAG-ZINC PO) Take 1 tablet by mouth daily.  Marland Kitchen CANNABIDIOL PO Take 30 mg by mouth daily. Capsule  . cholecalciferol (VITAMIN D3) 25 MCG (1000 UNIT) tablet Take 1,000 Units by mouth daily.  . diclofenac sodium (VOLTAREN) 1 % GEL Apply 2 g topically 4 (four) times daily as needed (pain.).   Marland Kitchen diphenhydrAMINE (BENADRYL) 25 MG tablet Take 25 mg by mouth every 6 (six) hours as needed (allergies.).  Marland Kitchen fluconazole (DIFLUCAN) 200 MG tablet Take 2 tablets (400mg ) on day 1 and then 1 tablet daily for 13 days  . fluticasone (FLONASE) 50 MCG/ACT nasal spray SPRAY 2 SPRAYS INTO EACH NOSTRIL EVERY DAY  . fluticasone (FLOVENT  HFA) 220 MCG/ACT inhaler TAKE 1 PUFF BY MOUTH TWICE A DAY  . ibuprofen (ADVIL) 200 MG tablet Take 200 mg by mouth every 8 (eight) hours as needed (pain.).  Marland Kitchen levothyroxine (SYNTHROID) 75 MCG tablet TAKE 1 TABLET BY MOUTH EVERY DAY ON AN EMPTY STOMACH  . montelukast (SINGULAIR) 10 MG tablet TAKE 1 TABLET BY MOUTH EVERY DAY IN THE EVENING (Patient taking differently: Take 10 mg by mouth every evening.)  . Multiple Vitamin (MULTIVITAMIN WITH MINERALS) TABS tablet Take 1 tablet by mouth daily.  . NONFORMULARY OR COMPOUNDED ITEM Place 0.5 g vaginally every other day. At night (Estradiol  1mg /gm cream)  . omeprazole (PRILOSEC) 40 MG capsule TAKE 1 CAPSULE (40 MG TOTAL) BY MOUTH 2 (TWO) TIMES DAILY BEFORE A MEAL.  Marland Kitchen pseudoephedrine (SUDAFED) 60 MG tablet Take 30 mg by mouth every 4 (four) hours as needed (allergies.).   Marland Kitchen Spacer/Aero-Holding Chambers (AEROCHAMBER MV) inhaler Use as instructed  . vitamin E 180 MG (400 UNITS) capsule Take 400 Units by mouth daily.    Immunization History  Administered Date(s) Administered  . Fluad Quad(high Dose 65+) 06/16/2019, 09/18/2020  . Influenza Split 07/06/2012  . Influenza, High Dose Seasonal PF 08/22/2017, 07/01/2018  . Influenza-Unspecified 06/22/2011, 08/19/2013, 09/20/2014, 08/07/2015, 08/27/2016, 08/22/2017  . PFIZER(Purple Top)SARS-COV-2 Vaccination 11/27/2019, 12/20/2019, 07/25/2020  . Pneumococcal Conjugate-13 07/22/2019  . Pneumococcal Polysaccharide-23 05/01/2012, 11/21/2020  . Pneumococcal-Unspecified 07/26/2009  . Td 10/21/2001  . Tdap 12/24/2010  . Zoster 05/01/2013       Objective:   Physical Exam BP 128/78 (BP Location: Left Arm, Cuff Size: Normal)   Pulse 71   Temp (!) 97 F (36.1 C) (Temporal)   Ht 5\' 7"  (1.702 m)   Wt 135 lb 12.8 oz (61.6 kg)   SpO2 97%   BMI 21.27 kg/m  GENERAL: Well-developed, well-nourished woman in no acute distress.  Fully ambulatory.  Well-kempt. HEAD: Normocephalic, atraumatic.  EYES: Pupils equal,  round, reactive to light.  No scleral icterus.  Injected conjunctiva. MOUTH: Nose/mouth/throat not examined due to masking requirements for COVID 19. NECK: Supple. No thyromegaly. Trachea midline. No JVD.  No adenopathy. PULMONARY: Good air entry bilaterally.  Coarse with no other adventitious sounds. CARDIOVASCULAR: S1 and S2. Regular rate and rhythm.  No rubs, murmurs or gallops heard.   ABDOMEN: Benign. MUSCULOSKELETAL: No joint deformity, no clubbing, no edema.  NEUROLOGIC: No focal deficits, no gait disturbance, speech is fluent. SKIN: Intact,warm,dry.  On limited exam no rashes. PSYCH: Mood and behavior normal.       Assessment & Plan:     ICD-10-CM   1. Mild persistent asthma in adult without complication  U54.27    Will discontinue Flovent Stop Singulair Accolate 20 mg twice a day As needed albuterol   2. Esophageal candidiasis (HCC)  B37.81    On fluconazole Management per GI   3. Perennial allergic rhinitis  J30.89    Switch fluticasone to azelastine Zyrtec 10 mg at bedtime  4. Medication monitoring encounter  Z51.81 Hepatic function panel    CANCELED: Hepatic function panel   Check hepatic function prior to Accolate   Orders Placed This Encounter  Procedures  . Hepatic function panel    Standing Status:   Future    Number of Occurrences:   1    Standing Expiration Date:   11/23/2021   Meds ordered this encounter  Medications  . zafirlukast (ACCOLATE) 20 MG tablet    Sig: Take 1 tablet (20 mg total) by mouth 2 (two) times daily.    Dispense:  60 tablet    Refill:  6  . azelastine (ASTELIN) 0.1 % nasal spray    Sig: Place 1 spray into both nostrils 2 (two) times daily.    Dispense:  30 mL    Refill:  6  . cetirizine (ZYRTEC ALLERGY) 10 MG tablet    Sig: Take 1 tablet (10 mg total) by mouth daily.    Dispense:  30 tablet    Refill:  6   Discussion:  We will try to avoid inhaled steroids.  I am surprised that she is having the severity of esophageal  candidiasis with fluticasone.  Particularly since she claims that she is rinsing her mouth well after she uses the medication.  This usually indicates perhaps an immune deficiency.  Ideally would check HIV however her insurance company will not allow this and patient cannot pay out-of-pocket for this.  On review of her prior CBC it shows that she does not have lymphopenia so hopefully this is not an issue.  Medication changes were noted as above.  We will see the patient in follow-up in 4 to 6 weeks time she is to contact us prior to that time time should any new difficulties arise.  Renold Don, MD [facility]   *This note was dictated using voice recognition software/Dragon.  Despite best efforts to proofread, errors can occur which can change the meaning.  Any change was purely unintentional.

## 2020-11-23 NOTE — Telephone Encounter (Signed)
Pt wants to have her referral for her Diagnostic Work switched from [place] [facility]/ [facility]  to [facility].  Not wanting to drive an hour to have this done.  Please call patient to let her know this can be done and to let her know when it can be done. Call patient at [phone].  Thanks, [employer]

## 2020-11-23 NOTE — Telephone Encounter (Signed)
Changed orders to [facility]

## 2020-11-24 NOTE — Telephone Encounter (Signed)
Pt advised.   Thanks,   -Leigh Kaeding  

## 2020-11-28 ENCOUNTER — Telehealth: Payer: Self-pay | Admitting: Pulmonary Disease

## 2020-11-28 NOTE — Telephone Encounter (Signed)
Called and spoke to patient. Patient stated since starting Accolate, she developed indigesting and stomach pain that radiated into her back and under ribs on right side. Sx started after second dose. She stopped taking this medication on Sunday and all sx have subsided.  She has resumed singular. She is requesting recommendations.   Dr. Patsey Berthold, please advise. thanks

## 2020-11-28 NOTE — Telephone Encounter (Signed)
Patient is aware of below recommendations and voiced her understanding.  Patient stated that she will discuss resuming inhaled corticosteroid with GI at her appt on 01/01/2021. She will call back with an update. Nothing further needed at this time.

## 2020-11-28 NOTE — Telephone Encounter (Signed)
Continue Singulair then.  There is no other alternative.  She is having deal difficulties with inhaled corticosteroids.  Does use albuterol if she needs it.  Once she gets over the issues with the thrush and esophagitis a consideration would be Alvesco which is the safest corticosteroid in this regard.

## 2020-11-29 ENCOUNTER — Other Ambulatory Visit: Payer: Self-pay | Admitting: *Deleted

## 2020-11-29 ENCOUNTER — Inpatient Hospital Stay
Admission: RE | Admit: 2020-11-29 | Discharge: 2020-11-29 | Disposition: A | Discharge status: Home / Self Care | Payer: Self-pay | Source: Ambulatory Visit | Attending: *Deleted | Admitting: *Deleted

## 2020-11-29 DIAGNOSIS — Z1231 Encounter for screening mammogram for malignant neoplasm of breast: Secondary | ICD-10-CM

## 2020-11-30 DIAGNOSIS — J45909 Unspecified asthma, uncomplicated: Secondary | ICD-10-CM | POA: Diagnosis not present

## 2020-11-30 DIAGNOSIS — R000 Tachycardia, unspecified: Secondary | ICD-10-CM | POA: Diagnosis not present

## 2020-11-30 DIAGNOSIS — J4520 Mild intermittent asthma, uncomplicated: Secondary | ICD-10-CM | POA: Diagnosis not present

## 2020-11-30 DIAGNOSIS — R0602 Shortness of breath: Secondary | ICD-10-CM | POA: Diagnosis not present

## 2020-11-30 DIAGNOSIS — K219 Gastro-esophageal reflux disease without esophagitis: Secondary | ICD-10-CM | POA: Diagnosis not present

## 2020-11-30 DIAGNOSIS — R002 Palpitations: Secondary | ICD-10-CM | POA: Diagnosis not present

## 2020-11-30 DIAGNOSIS — R079 Chest pain, unspecified: Secondary | ICD-10-CM | POA: Diagnosis not present

## 2020-12-06 NOTE — Progress Notes (Unsigned)
Subjective:   Kimberly Mercer is a 75 y.o. female who presents for Medicare Annual (Subsequent) preventive examination.  I connected with Gray Bernhardt today by telephone and verified that I am speaking with the correct person using two identifiers. Location patient: home Location provider: work Persons participating in the virtual visit: patient, provider.   I discussed the limitations, risks, security and privacy concerns of performing an evaluation and management service by telephone and the availability of in person appointments. I also discussed with the patient that there may be a patient responsible charge related to this service. The patient expressed understanding and verbally consented to this telephonic visit.    Interactive audio and video telecommunications were attempted between this provider and patient, however failed, due to patient having technical difficulties OR patient did not have access to video capability.  We continued and completed visit with audio only.   Review of Systems    N/A  Cardiac Risk Factors include: advanced age (>53men, >27 women)     Objective:    There were no vitals filed for this visit. There is no height or weight on file to calculate BMI.  Advanced Directives 12/07/2020 11/14/2020 08/22/2020 07/14/2019 04/30/2018  Does Patient Have a Medical Advance Directive? No No No No No  Would patient like information on creating a medical advance directive? No - Patient declined - - - No - Patient declined    Current Medications (verified) Outpatient Encounter Medications as of 12/07/2020  Medication Sig  . acetaminophen (TYLENOL) 650 MG CR tablet Take 650 mg by mouth every 8 (eight) hours as needed for pain.   Marland Kitchen albuterol (PROVENTIL) (2.5 MG/3ML) 0.083% nebulizer solution Take 3 mLs (2.5 mg total) by nebulization every 6 (six) hours as needed for wheezing or shortness of breath. Dx:J45.909  . albuterol (VENTOLIN HFA) 108 (90 Base) MCG/ACT inhaler  TAKE 2 PUFFS BY MOUTH EVERY 6 HOURS AS NEEDED FOR WHEEZE OR SHORTNESS OF BREATH  . aspirin EC 81 MG tablet Take 81 mg by mouth every evening. Swallow whole.  . Calcium-Magnesium-Zinc (CAL-MAG-ZINC PO) Take 1 tablet by mouth daily.  Marland Kitchen CANNABIDIOL PO Take 30 mg by mouth daily. Capsule  . cetirizine (ZYRTEC ALLERGY) 10 MG tablet Take 1 tablet (10 mg total) by mouth daily. (Patient taking differently: Take 5 mg by mouth at bedtime.)  . cholecalciferol (VITAMIN D3) 25 MCG (1000 UNIT) tablet Take 1,000 Units by mouth daily.  Marland Kitchen levothyroxine (SYNTHROID) 75 MCG tablet TAKE 1 TABLET BY MOUTH EVERY DAY ON AN EMPTY STOMACH  . montelukast (SINGULAIR) 10 MG tablet Take 10 mg by mouth at bedtime.  . Multiple Vitamin (MULTIVITAMIN WITH MINERALS) TABS tablet Take 1 tablet by mouth daily.  . NONFORMULARY OR COMPOUNDED ITEM Place 0.5 g vaginally every other day. At night (Estradiol 1mg /gm cream)  . omeprazole (PRILOSEC) 40 MG capsule TAKE 1 CAPSULE (40 MG TOTAL) BY MOUTH 2 (TWO) TIMES DAILY BEFORE A MEAL.  Marland Kitchen pseudoephedrine (SUDAFED) 60 MG tablet Take 30 mg by mouth every morning.  Marland Kitchen Spacer/Aero-Holding Chambers (AEROCHAMBER MV) inhaler Use as instructed  . vitamin E 180 MG (400 UNITS) capsule Take 400 Units by mouth daily.   Marland Kitchen azelastine (ASTELIN) 0.1 % nasal spray Place 1 spray into both nostrils 2 (two) times daily. (Patient not taking: Reported on 12/07/2020)  . diclofenac sodium (VOLTAREN) 1 % GEL Apply 2 g topically 4 (four) times daily as needed (pain.).  (Patient not taking: Reported on 12/07/2020)  . diphenhydrAMINE (BENADRYL) 25 MG  tablet Take 25 mg by mouth every 6 (six) hours as needed (allergies.). (Patient not taking: Reported on 12/07/2020)  . fluconazole (DIFLUCAN) 200 MG tablet Take 2 tablets (400mg ) on day 1 and then 1 tablet daily for 13 days (Patient not taking: Reported on 12/07/2020)  . ibuprofen (ADVIL) 200 MG tablet Take 200 mg by mouth every 8 (eight) hours as needed (pain.). (Patient not  taking: Reported on 12/07/2020)  . zafirlukast (ACCOLATE) 20 MG tablet Take 1 tablet (20 mg total) by mouth 2 (two) times daily. (Patient not taking: Reported on 12/07/2020)   No facility-administered encounter medications on file as of 12/07/2020.    Allergies (verified) Claritin [loratadine], Fructose, Gluten meal, Ipratropium, Lactase, Levaquin [levofloxacin], and Sulfa drugs cross reactors   History: Past Medical History:  Diagnosis Date  . Allergic rhinitis, cause unspecified   . Allergy   . Asthma   . Benign neoplasm of colon   . Cancer ([place])   . Candidiasis of the esophagus   . Chicken pox   . Diverticulitis of colon (without mention of hemorrhage)(562.11)   . E coli enteritis   . GERD (gastroesophageal reflux disease)   . Hemorrhoids    internal  . Measles   . Osteoarthrosis, unspecified whether generalized or localized, unspecified site   . Symptomatic menopausal or female climacteric states   . Unspecified asthma(493.90)   . Unspecified hypothyroidism    Past Surgical History:  Procedure Laterality Date  . ABDOMINAL HYSTERECTOMY    . cardiac catherization  12/09/2008   normal coranaries,normal EF.  Marland Kitchen CARDIAC CATHETERIZATION    . dilatation and curettage     due to SAB x 2  . DILATION AND CURETTAGE OF UTERUS    . ESOPHAGOGASTRODUODENOSCOPY (EGD) WITH PROPOFOL N/A 11/14/2020   Procedure: ESOPHAGOGASTRODUODENOSCOPY (EGD) WITH PROPOFOL;  Surgeon: Lin Landsman, MD;  Location: [facility];  Service: Gastroenterology;  Laterality: N/A;  . LEFT HEART CATH AND CORONARY ANGIOGRAPHY N/A 08/25/2020   Procedure: LEFT HEART CATH AND CORONARY ANGIOGRAPHY;  Surgeon: Yolonda Kida, MD;  Location: [facility];  Service: Cardiovascular;  Laterality: N/A;  . thyroid nodule  1990   benign  . TOTAL ABDOMINAL HYSTERECTOMY W/ BILATERAL SALPINGOOPHORECTOMY  2003   complex endometrial hyperplasia, DUB, fibroids  . WISDOM TOOTH EXTRACTION     Family History   Problem Relation Age of Onset  . Heart disease Mother        CAD  . Stroke Mother        x 2  . Mental illness Mother        not diagnosed  . Irritable bowel syndrome Mother   . Heart disease Father        CAD  . Parkinsonism Father   . Cancer Sister        Breast  . Cancer Other        Breast and Ovarian  . Heart disease Brother   . Schizophrenia Sister   . Lung disease Sister    Social History   Socioeconomic History  . Marital status: Married    Spouse name: Not on file  . Number of children: 2  . Years of education: masters  . Highest education level: Master's degree (e.g., MA, MS, MEng, MEd, MSW, MBA)  Occupational History  . Occupation: Retired    Comment: Pharmacist, hospital  Tobacco Use  . Smoking status: Never Smoker  . Smokeless tobacco: Never Used  Vaping Use  . Vaping Use: Never used  Substance  and Sexual Activity  . Alcohol use: No  . Drug use: No  . Sexual activity: Not on file  Other Topics Concern  . Not on file  Social History Narrative   Always uses seat belts. Smoke alarm in the home. No caffeine use. 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 Married x 15 years;3rd marriage. Happily married. Husband with bladder cancer and possible gastric cancer 2012-2013. Has 2 children, 2 grandchildren.   Social Determinants of Health   Financial Resource Strain: Low Risk   . Difficulty of Paying Living Expenses: Not hard at all  Food Insecurity: No Food Insecurity  . Worried About Charity fundraiser in the Last Year: Never true  . Ran Out of Food in the Last Year: Never true  Transportation Needs: No Transportation Needs  . Lack of Transportation (Medical): No  . Lack of Transportation (Non-Medical): No  Physical Activity: Inactive  . Days of Exercise per Week: 0 days  . Minutes of Exercise per Session: 0 min  Stress: No Stress Concern Present  . Feeling of Stress : Not at all  Social Connections:  Moderately Integrated  . Frequency of Communication with Friends and Family: More than three times a week  . Frequency of Social Gatherings with Friends and Family: Twice a week  . Attends Religious Services: Never  . Active Member of Clubs or Organizations: Yes  . Attends Archivist Meetings: More than 4 times per year  . Marital Status: Married    Tobacco Counseling Counseling given: Not Answered   Clinical Intake:  Pre-visit preparation completed: Yes  Pain : No/denies pain     Nutritional Risks: None Diabetes: No  How often do you need to have someone help you when you read instructions, pamphlets, or other written materials from your doctor or pharmacy?: 1 - Never  Diabetic? No  Interpreter Needed?: No  Information entered by :: [facility] [place], LPN   Activities of Daily Living In your present state of health, do you have any difficulty performing the following activities: 12/07/2020 11/21/2020  Hearing? N N  Vision? N N  Difficulty concentrating or making decisions? N N  Walking or climbing stairs? Y N  Comment Occasional arthritic knee pains. -  Dressing or bathing? N N  Doing errands, shopping? N N  Preparing Food and eating ? N -  Using the Toilet? N -  In the past six months, have you accidently leaked urine? N -  Do you have problems with loss of bowel control? N -  Managing your Medications? N -  Managing your Finances? N -  Housekeeping or managing your Housekeeping? N -  Some recent data might be hidden    Patient Care Team: Mar Daring, PA-C as PCP - General (Family Medicine) Yolonda Kida, MD as Consulting Physician (Cardiology) Lin Landsman, MD as Consulting Physician (Gastroenterology) Anell Barr, OD (Optometry) Tyler Pita, MD as Consulting Physician (Pulmonary Disease) Benjaman Kindler, MD as Consulting Physician (Obstetrics and Gynecology)  Indicate any recent Medical Services you may have received from  other than [facility] providers in the past year (date may be approximate).     Assessment:   This is a routine wellness examination for Martha.  Hearing/Vision screen No exam data present  Dietary issues and exercise activities discussed: Current Exercise Habits: The patient has a physically strenuous job, but has no regular exercise apart from work.  Goals   None  Depression Screen PHQ 2/9 Scores 11/21/2020 09/18/2020 07/14/2019 06/16/2019 02/19/2018  PHQ - 2 Score 0 0 0 0 0  PHQ- 9 Score 0 - - - 0    Fall Risk Fall Risk  12/07/2020 11/21/2020 09/18/2020 07/14/2019 02/19/2018  Falls in the past year? 0 0 0 1 No  Number falls in past yr: 0 0 0 1 -  Injury with Fall? 0 0 0 0 -  Risk for fall due to : - No Fall Risks No Fall Risks - -  Follow up - Falls evaluation completed Falls evaluation completed Falls prevention discussed -    FALL RISK PREVENTION PERTAINING TO THE HOME:  Any stairs in or around the home? Yes  If so, are there any without handrails? No  Home free of loose throw rugs in walkways, pet beds, electrical cords, etc? Yes  Adequate lighting in your home to reduce risk of falls? Yes   ASSISTIVE DEVICES UTILIZED TO PREVENT FALLS:  Life alert? No  Use of a cane, walker or w/c? No  Grab bars in the bathroom? No  Shower chair or bench in shower? No  Elevated toilet seat or a handicapped toilet? No    Cognitive Function: Normal cognitive status assessed by observation by this Nurse Health Advisor. No abnormalities found.          Immunizations Immunization History  Administered Date(s) Administered  . Fluad Quad(high Dose 65+) 06/16/2019, 09/18/2020  . Influenza Split 07/06/2012  . Influenza, High Dose Seasonal PF 08/22/2017, 07/01/2018  . Influenza-Unspecified 06/22/2011, 08/19/2013, 09/20/2014, 08/07/2015, 08/27/2016, 08/22/2017  . PFIZER(Purple Top)SARS-COV-2 Vaccination 11/27/2019, 12/20/2019, 07/25/2020  . Pneumococcal Conjugate-13 07/22/2019  .  Pneumococcal Polysaccharide-23 05/01/2012, 11/21/2020  . Td 10/21/2001  . Tdap 12/24/2010  . Zoster 05/01/2013    TDAP status: Up to date  Flu Vaccine status: Up to date  Pneumococcal vaccine status: Up to date  Covid-19 vaccine status: Completed vaccines  Qualifies for Shingles Vaccine? Yes   Zostavax completed Yes   Shingrix Completed?: No.    Education has been provided regarding the importance of this vaccine. Patient has been advised to call insurance company to determine out of pocket expense if they have not yet received this vaccine. Advised may also receive vaccine at local pharmacy or Health Dept. Verbalized acceptance and understanding.  Screening Tests Health Maintenance  Topic Date Due  . DEXA SCAN  12/07/2021 (Originally 03/26/2013)  . COLONOSCOPY (Pts 45-10yrs Insurance coverage will need to be confirmed)  12/07/2021 (Originally 10/13/2019)  . TETANUS/TDAP  12/23/2020  . MAMMOGRAM  11/08/2021  . INFLUENZA VACCINE  Completed  . COVID-19 Vaccine  Completed  . Hepatitis C Screening  Completed  . PNA vac Low Risk Adult  Completed    Health Maintenance  There are no preventive care reminders to display for this patient.  Colorectal cancer screening: Currently due. Declined a referral or cologuard order.   Mammogram status: Currently due, scheduled for today.   Bone Density status: Currently due, declined an order at this time.   Lung Cancer Screening: (Low Dose CT Chest recommended if Age 34-80 years, 30 pack-year currently smoking OR have quit w/in 15years.) does not qualify.   Additional Screening:  Hepatitis C Screening: Up to date  Vision Screening: Recommended annual ophthalmology exams for early detection of glaucoma and other disorders of the eye. Is the patient up to date with their annual eye exam?  Yes  Who is the provider or what is the name of the office in which  the patient attends annual eye exams? Dr Ellin Mayhew If pt is not established with a  provider, would they like to be referred to a provider to establish care? No .   Dental Screening: Recommended annual dental exams for proper oral hygiene  Community Resource Referral / Chronic Care Management: CRR required this visit?  No   CCM required this visit?  No      Plan:     I have personally reviewed and noted the following in the patient's chart:   . Medical and social history . Use of alcohol, tobacco or illicit drugs  . Current medications and supplements . Functional ability and status . Nutritional status . Physical activity . Advanced directives . List of other physicians . Hospitalizations, surgeries, and ER visits in previous 12 months . Vitals . Screenings to include cognitive, depression, and falls . Referrals and appointments  In addition, I have reviewed and discussed with patient certain preventive protocols, quality metrics, and best practice recommendations. A written personalized care plan for preventive services as well as general preventive health recommendations were provided to patient.     Deshun Sedivy [place], [place]   [id number]   Nurse Notes: Pt declined a colonoscopy referral, cologuard order or DEXA order.

## 2020-12-07 ENCOUNTER — Other Ambulatory Visit: Payer: Self-pay

## 2020-12-07 ENCOUNTER — Ambulatory Visit (INDEPENDENT_AMBULATORY_CARE_PROVIDER_SITE_OTHER): Payer: Medicare Other

## 2020-12-07 ENCOUNTER — Ambulatory Visit
Admission: RE | Admit: 2020-12-07 | Discharge: 2020-12-07 | Disposition: A | Discharge status: Home / Self Care | Payer: Medicare Other | Source: Ambulatory Visit | Attending: Physician Assistant | Admitting: Physician Assistant

## 2020-12-07 DIAGNOSIS — N6489 Other specified disorders of breast: Secondary | ICD-10-CM | POA: Diagnosis not present

## 2020-12-07 DIAGNOSIS — Z0000 Encounter for general adult medical examination without abnormal findings: Secondary | ICD-10-CM

## 2020-12-07 DIAGNOSIS — R928 Other abnormal and inconclusive findings on diagnostic imaging of breast: Secondary | ICD-10-CM | POA: Diagnosis not present

## 2020-12-07 NOTE — Patient Instructions (Signed)
Kimberly Mercer , Thank you for taking time to come for your Medicare Wellness Visit. I appreciate your ongoing commitment to your health goals. Please review the following plan we discussed and let me know if I can assist you in the future.   Screening recommendations/referrals: Colonoscopy: Currently due, declined referral or cologuard order. Mammogram: Up to date, due 10/2021 Bone Density: Currently due, declined order. Recommended yearly ophthalmology/optometry visit for glaucoma screening and checkup Recommended yearly dental visit for hygiene and checkup  Vaccinations: Influenza vaccine: Done 09/18/20 Pneumococcal vaccine: Completed series Tdap vaccine: Up to date, due 12/2020 Shingles vaccine: Shingrix discussed. Please contact your pharmacy for coverage information.     Advanced directives: Advance directive discussed with you today. Even though you declined this today please call our office should you change your mind and we can give you the proper paperwork for you to fill out.  Conditions/risks identified: None. Declined diet or lifestyle changes.  Next appointment: None, declined scheduling a follow up with PCP or an AWV for 2023 at this time.    Preventive Care 55 Years and Older, Female Preventive care refers to lifestyle choices and visits with your health care provider that can promote health and wellness. What does preventive care include?  A yearly physical exam. This is also called an annual well check.  Dental exams once or twice a year.  Routine eye exams. Ask your health care provider how often you should have your eyes checked.  Personal lifestyle choices, including:  Daily care of your teeth and gums.  Regular physical activity.  Eating a healthy diet.  Avoiding tobacco and drug use.  Limiting alcohol use.  Practicing safe sex.  Taking low-dose aspirin every day.  Taking vitamin and mineral supplements as recommended by your health care provider. What  happens during an annual well check? The services and screenings done by your health care provider during your annual well check will depend on your age, overall health, lifestyle risk factors, and family history of disease. Counseling  Your health care provider may ask you questions about your:  Alcohol use.  Tobacco use.  Drug use.  Emotional well-being.  Home and relationship well-being.  Sexual activity.  Eating habits.  History of falls.  Memory and ability to understand (cognition).  Work and work Statistician.  Reproductive health. Screening  You may have the following tests or measurements:  Height, weight, and BMI.  Blood pressure.  Lipid and cholesterol levels. These may be checked every 5 years, or more frequently if you are over 78 years old.  Skin check.  Lung cancer screening. You may have this screening every year starting at age 24 if you have a 30-pack-year history of smoking and currently smoke or have quit within the past 15 years.  Fecal occult blood test (FOBT) of the stool. You may have this test every year starting at age 58.  Flexible sigmoidoscopy or colonoscopy. You may have a sigmoidoscopy every 5 years or a colonoscopy every 10 years starting at age 2.  Hepatitis C blood test.  Hepatitis B blood test.  Sexually transmitted disease (STD) testing.  Diabetes screening. This is done by checking your blood sugar (glucose) after you have not eaten for a while (fasting). You may have this done every 1-3 years.  Bone density scan. This is done to screen for osteoporosis. You may have this done starting at age 64.  Mammogram. This may be done every 1-2 years. Talk to your health care provider about  how often you should have regular mammograms. Talk with your health care provider about your test results, treatment options, and if necessary, the need for more tests. Vaccines  Your health care provider may recommend certain vaccines, such  as:  Influenza vaccine. This is recommended every year.  Tetanus, diphtheria, and acellular pertussis (Tdap, Td) vaccine. You may need a Td booster every 10 years.  Zoster vaccine. You may need this after age 48.  Pneumococcal 13-valent conjugate (PCV13) vaccine. One dose is recommended after age 48.  Pneumococcal polysaccharide (PPSV23) vaccine. One dose is recommended after age 33. Talk to your health care provider about which screenings and vaccines you need and how often you need them. This information is not intended to replace advice given to you by your health care provider. Make sure you discuss any questions you have with your health care provider. Document Released: 11/03/2015 Document Revised: 06/26/2016 Document Reviewed: 08/08/2015 Elsevier Interactive Patient Education  2017 [place] Prevention in the Home Falls can cause injuries. They can happen to people of all ages. There are many things you can do to make your home safe and to help prevent falls. What can I do on the outside of my home?  Regularly fix the edges of walkways and driveways and fix any cracks.  Remove anything that might make you trip as you walk through a door, such as a raised step or threshold.  Trim any bushes or trees on the path to your home.  Use bright outdoor lighting.  Clear any walking paths of anything that might make someone trip, such as rocks or tools.  Regularly check to see if handrails are loose or broken. Make sure that both sides of any steps have handrails.  Any raised decks and porches should have guardrails on the edges.  Have any leaves, snow, or ice cleared regularly.  Use sand or salt on walking paths during winter.  Clean up any spills in your garage right away. This includes oil or grease spills. What can I do in the bathroom?  Use night lights.  Install grab bars by the toilet and in the tub and shower. Do not use towel bars as grab bars.  Use  non-skid mats or decals in the tub or shower.  If you need to sit down in the shower, use a plastic, non-slip stool.  Keep the floor dry. Clean up any water that spills on the floor as soon as it happens.  Remove soap buildup in the tub or shower regularly.  Attach bath mats securely with double-sided non-slip rug tape.  Do not have throw rugs and other things on the floor that can make you trip. What can I do in the bedroom?  Use night lights.  Make sure that you have a light by your bed that is easy to reach.  Do not use any sheets or blankets that are too big for your bed. They should not hang down onto the floor.  Have a firm chair that has side arms. You can use this for support while you get dressed.  Do not have throw rugs and other things on the floor that can make you trip. What can I do in the kitchen?  Clean up any spills right away.  Avoid walking on wet floors.  Keep items that you use a lot in easy-to-reach places.  If you need to reach something above you, use a strong step stool that has a grab bar.  Keep  electrical cords out of the way.  Do not use floor polish or wax that makes floors slippery. If you must use wax, use non-skid floor wax.  Do not have throw rugs and other things on the floor that can make you trip. What can I do with my stairs?  Do not leave any items on the stairs.  Make sure that there are handrails on both sides of the stairs and use them. Fix handrails that are broken or loose. Make sure that handrails are as long as the stairways.  Check any carpeting to make sure that it is firmly attached to the stairs. Fix any carpet that is loose or worn.  Avoid having throw rugs at the top or bottom of the stairs. If you do have throw rugs, attach them to the floor with carpet tape.  Make sure that you have a light switch at the top of the stairs and the bottom of the stairs. If you do not have them, ask someone to add them for you. What  else can I do to help prevent falls?  Wear shoes that:  Do not have high heels.  Have rubber bottoms.  Are comfortable and fit you well.  Are closed at the toe. Do not wear sandals.  If you use a stepladder:  Make sure that it is fully opened. Do not climb a closed stepladder.  Make sure that both sides of the stepladder are locked into place.  Ask someone to hold it for you, if possible.  Clearly mark and make sure that you can see:  Any grab bars or handrails.  First and last steps.  Where the edge of each step is.  Use tools that help you move around (mobility aids) if they are needed. These include:  Canes.  Walkers.  Scooters.  Crutches.  Turn on the lights when you go into a dark area. Replace any light bulbs as soon as they burn out.  Set up your furniture so you have a clear path. Avoid moving your furniture around.  If any of your floors are uneven, fix them.  If there are any pets around you, be aware of where they are.  Review your medicines with your doctor. Some medicines can make you feel dizzy. This can increase your chance of falling. Ask your doctor what other things that you can do to help prevent falls. This information is not intended to replace advice given to you by your health care provider. Make sure you discuss any questions you have with your health care provider. Document Released: 08/03/2009 Document Revised: 03/14/2016 Document Reviewed: 11/11/2014 Elsevier Interactive Patient Education  2017 [employer].

## 2020-12-15 ENCOUNTER — Other Ambulatory Visit: Payer: Self-pay | Admitting: Physician Assistant

## 2020-12-15 DIAGNOSIS — J45909 Unspecified asthma, uncomplicated: Secondary | ICD-10-CM

## 2020-12-15 NOTE — Telephone Encounter (Signed)
Requested Prescriptions  Pending Prescriptions Disp Refills  . albuterol (VENTOLIN HFA) 108 (90 Base) MCG/ACT inhaler [Pharmacy Med Name: ALBUTEROL HFA (VENTOLIN) INH] 18 each 1    Sig: TAKE 2 PUFFS BY MOUTH EVERY 6 HOURS AS NEEDED FOR WHEEZE OR SHORTNESS OF BREATH     Pulmonology:  Beta Agonists Failed - 12/15/2020 12:30 PM      Failed - One inhaler should last at least one month. If the patient is requesting refills earlier, contact the patient to check for uncontrolled symptoms.      Passed - Valid encounter within last 12 months    Recent Outpatient Visits          3 weeks ago Atherosclerosis of abdominal aorta [facility])   [place], [place]   2 months ago Non-seasonal allergic rhinitis due to pollen   [facility], [name], PA-C   7 months ago Vaginal atrophy   [place], [place], [place]   8 months ago Thyroid disease   [facility] [place], [name], [place]   1 year ago Thyroid disease   [place], [name], PA-C      Future Appointments            In 2 weeks Vanga, Tally Due, MD [place]   In 2 weeks Tyler Pita, MD [facility] [place]

## 2021-01-01 ENCOUNTER — Ambulatory Visit: Payer: Medicare Other | Admitting: Gastroenterology

## 2021-01-03 ENCOUNTER — Encounter: Payer: Self-pay | Admitting: Pulmonary Disease

## 2021-01-03 ENCOUNTER — Other Ambulatory Visit: Payer: Self-pay

## 2021-01-03 ENCOUNTER — Ambulatory Visit (INDEPENDENT_AMBULATORY_CARE_PROVIDER_SITE_OTHER): Payer: Medicare Other | Admitting: Pulmonary Disease

## 2021-01-03 VITALS — BP 124/78 | HR 65 | Temp 97.0°F | Ht 67.0 in | Wt 137.6 lb

## 2021-01-03 DIAGNOSIS — J4530 Mild persistent asthma, uncomplicated: Secondary | ICD-10-CM

## 2021-01-03 DIAGNOSIS — J3089 Other allergic rhinitis: Secondary | ICD-10-CM

## 2021-01-03 DIAGNOSIS — B3781 Candidal esophagitis: Secondary | ICD-10-CM | POA: Diagnosis not present

## 2021-01-03 NOTE — Patient Instructions (Signed)
You seem to be doing very well, continue your current regimen.   We will see you in follow-up in 6 months time call sooner should any new problems arise.

## 2021-01-03 NOTE — Progress Notes (Signed)
Subjective:    Patient ID: Kimberly Mercer, female    DOB: 1946-02-05, 75 y.o.   MRN: [id number]  Chief Complaint  Patient presents with  . Follow-up    Breathing is doing well. No current sx.    HPI [name] Pulse is a 75 year old lifelong never smoker who has mild intermittent asthma.  Last seen here on 23 November 2020 wishing to come off inhalers due to esophageal candidiasis.  She had been on Flovent inhaler.  Is been a recurrent issue for her.  Because of the debilitating symptoms she was taken off Flovent.  She was not sure that Singulair was helping her much.  We gave her a trial of Accolate and she did not tolerate that so she went back to Singulair and as needed albuterol.  She notes that she has been doing well with no symptomatology.  She has no dyspnea, no cough, no paroxysmal nocturnal dyspnea.  She has no orthopnea or lower extremity edema.  No chest pain or chest tightness.  No recent issues with her perennial rhinitis.  She feels well and looks well today.  DATA: Pulmonary Functions Testing Results: 08/19/2019 FEV1 2.46 L or 97% predicted, FVC 3.46 L or 103% predicted, FEV1/FVC 71%.  Lung volumes normal.Diffusion capacity normal.  Minimal air trapping.  In essence normal PFTs.  Review of Systems A 10 point review of systems was performed and it is as noted above otherwise negative.  Patient Active Problem List   Diagnosis Date Noted  . Atherosclerosis of abdominal aorta ([place]) 11/21/2020  . Syncope 04/30/2018  . Chronic midline low back pain 03/31/2018  . Primary osteoarthritis of both first carpometacarpal joints 12/18/2015  . Thyroid disease 01/03/2014  . Menopause 07/06/2012  . Asthma 07/06/2012  . GERD (gastroesophageal reflux disease) 07/06/2012  . Allergic rhinitis 07/06/2012  . Varicose veins 07/06/2012   Social History   Tobacco Use  . Smoking status: Never Smoker  . Smokeless tobacco: Never Used  Substance Use Topics  . Alcohol use: No   Allergies  Allergen  Reactions  . Claritin [Loratadine] Other (See Comments)    Panic attacks  . Fructose Diarrhea  . Gluten Meal Other (See Comments)    GI UPSET - CRAMPING  . Ipratropium Nausea And Vomiting    (nebulizer solution) Nausea/indigestion  . Lactase Other (See Comments)    Pain, bloating abdominal pain  . Levaquin [Levofloxacin] Nausea Only    Yeast infection  . Sulfa Drugs Cross Reactors Hives    Childhood reaction   Current Meds  Medication Sig  . acetaminophen (TYLENOL) 650 MG CR tablet Take 650 mg by mouth every 8 (eight) hours as needed for pain.   Marland Kitchen albuterol (PROVENTIL) (2.5 MG/3ML) 0.083% nebulizer solution Take 3 mLs (2.5 mg total) by nebulization every 6 (six) hours as needed for wheezing or shortness of breath. Dx:J45.909  . albuterol (VENTOLIN HFA) 108 (90 Base) MCG/ACT inhaler TAKE 2 PUFFS BY MOUTH EVERY 6 HOURS AS NEEDED FOR WHEEZE OR SHORTNESS OF BREATH  . aspirin EC 81 MG tablet Take 81 mg by mouth every evening. Swallow whole.  . Calcium-Magnesium-Zinc (CAL-MAG-ZINC PO) Take 1 tablet by mouth daily.  Marland Kitchen CANNABIDIOL PO Take 30 mg by mouth daily. Capsule  . cetirizine (ZYRTEC ALLERGY) 10 MG tablet Take 1 tablet (10 mg total) by mouth daily. (Patient taking differently: Take 5 mg by mouth at bedtime.)  . cholecalciferol (VITAMIN D3) 25 MCG (1000 UNIT) tablet Take 1,000 Units by mouth daily.  . diclofenac  sodium (VOLTAREN) 1 % GEL Apply 2 g topically 4 (four) times daily as needed (pain.).  Marland Kitchen levothyroxine (SYNTHROID) 75 MCG tablet TAKE 1 TABLET BY MOUTH EVERY DAY ON AN EMPTY STOMACH  . montelukast (SINGULAIR) 10 MG tablet Take 10 mg by mouth at bedtime.  . Multiple Vitamin (MULTIVITAMIN WITH MINERALS) TABS tablet Take 1 tablet by mouth daily.  . NONFORMULARY OR COMPOUNDED ITEM Place 0.5 g vaginally every other day. At night (Estradiol 1mg /gm cream)  . omeprazole (PRILOSEC) 40 MG capsule TAKE 1 CAPSULE (40 MG TOTAL) BY MOUTH 2 (TWO) TIMES DAILY BEFORE A MEAL.  Marland Kitchen pseudoephedrine  (SUDAFED) 60 MG tablet Take 30 mg by mouth every morning.  Marland Kitchen Spacer/Aero-Holding Chambers (AEROCHAMBER MV) inhaler Use as instructed  . vitamin E 180 MG (400 UNITS) capsule Take 400 Units by mouth daily.   . [DISCONTINUED] fluconazole (DIFLUCAN) 200 MG tablet Take 2 tablets (400mg ) on day 1 and then 1 tablet daily for 13 days  . [DISCONTINUED] ibuprofen (ADVIL) 200 MG tablet Take 200 mg by mouth every 8 (eight) hours as needed (pain.).  . [DISCONTINUED] zafirlukast (ACCOLATE) 20 MG tablet Take 1 tablet (20 mg total) by mouth 2 (two) times daily.   Immunization History  Administered Date(s) Administered  . Fluad Quad(high Dose 65+) 06/16/2019, 09/18/2020  . Influenza Split 07/06/2012  . Influenza, High Dose Seasonal PF 08/22/2017, 07/01/2018  . Influenza-Unspecified 06/22/2011, 08/19/2013, 09/20/2014, 08/07/2015, 08/27/2016, 08/22/2017  . PFIZER(Purple Top)SARS-COV-2 Vaccination 11/27/2019, 12/20/2019, 07/25/2020  . Pneumococcal Conjugate-13 07/22/2019  . Pneumococcal Polysaccharide-23 05/01/2012, 11/21/2020  . Td 10/21/2001  . Tdap 12/24/2010  . Zoster 05/01/2013       Objective:   Physical Exam BP 124/78 (BP Location: Left Arm, Cuff Size: Normal)   Pulse 65   Temp (!) 97 F (36.1 C) (Temporal)   Ht 5\' 7"  (1.702 m)   Wt 137 lb 9.6 oz (62.4 kg)   SpO2 98%   BMI 21.55 kg/m  GENERAL: Well-developed, well-nourished woman in no acute distress.  Fully ambulatory.  Well-kempt. HEAD: Normocephalic, atraumatic.  EYES: Pupils equal, round, reactive to light.  No scleral icterus.  Injected conjunctiva. MOUTH: Nose/mouth/throat not examined due to masking requirements for COVID 19. NECK: Supple. No thyromegaly. Trachea midline. No JVD.  No adenopathy. PULMONARY: Good air entry bilaterally.  Coarse with no other adventitious sounds. CARDIOVASCULAR: S1 and S2. Regular rate and rhythm.  No rubs, murmurs or gallops heard.   ABDOMEN: Benign. MUSCULOSKELETAL: No joint deformity, no clubbing,  no edema.  NEUROLOGIC: No focal deficits, no gait disturbance, speech is fluent. SKIN: Intact,warm,dry.  On limited exam no rashes. PSYCH: Mood and behavior normal.     Assessment & Plan:     ICD-10-CM   1. Mild persistent asthma in adult without complication  K93.26    Well compensated on Singulair and as needed albuterol Avoiding ICS due to issues with esophageal candidiasis  2. Perennial allergic rhinitis  J30.89    Continue nasal hygiene and antihistamines as needed  3. Esophageal candidiasis (HCC)  B37.81    This issue adds complexity to her management We will try to avoid ICS from here on   Patient is to continue her current regimen.  We will see her in follow-up in 6 months time she is to contact us prior to that time should any new difficulties arise.  Renold Don, MD [place] [facility]   *This note was dictated using voice recognition software/Dragon.  Despite best efforts to proofread, errors can occur which  can change the meaning.  Any change was purely unintentional.

## 2021-01-12 ENCOUNTER — Other Ambulatory Visit: Payer: Self-pay | Admitting: Physician Assistant

## 2021-01-12 DIAGNOSIS — E079 Disorder of thyroid, unspecified: Secondary | ICD-10-CM

## 2021-01-12 NOTE — Telephone Encounter (Signed)
Requested medication (s) are due for refill today: yes   Requested medication (s) are on the active medication list: yes  Last refill:  10/03/2020  Future visit scheduled: no  Notes to clinic:   TSH needs to be rechecked within 3 months after an abnormal result. Refill until TSH is due  Requested Prescriptions  Pending Prescriptions Disp Refills   levothyroxine (SYNTHROID) 75 MCG tablet [Pharmacy Med Name: LEVOTHYROXINE 75 MCG TABLET] 90 tablet 0    Sig: TAKE 1 TABLET BY MOUTH EVERY DAY ON AN EMPTY STOMACH      Endocrinology:  Hypothyroid Agents Failed - 01/12/2021 10:13 AM      Failed - TSH needs to be rechecked within 3 months after an abnormal result. Refill until TSH is due.      Passed - TSH in normal range and within 360 days    TSH  Date Value Ref Range Status  03/21/2020 1.130 0.450 - 4.500 uIU/mL Final          Passed - Valid encounter within last 12 months    Recent Outpatient Visits           1 month ago Atherosclerosis of abdominal aorta [facility])   [place], [place]   3 months ago Non-seasonal allergic rhinitis due to pollen   [facility], [name], [place]-C   8 months ago Vaginal atrophy   [facility] [place], [place], [place]   9 months ago Thyroid disease   [facility] [place], [name], [place]   1 year ago Thyroid disease   [place], [name], [place]       Future Appointments             In 3 weeks Vanga, Tally Due, MD [place]

## 2021-01-26 ENCOUNTER — Ambulatory Visit: Payer: Medicare Other | Admitting: Family Medicine

## 2021-02-05 ENCOUNTER — Other Ambulatory Visit: Payer: Self-pay

## 2021-02-05 ENCOUNTER — Other Ambulatory Visit: Payer: Self-pay | Admitting: Physician Assistant

## 2021-02-05 NOTE — Telephone Encounter (Signed)
Requested medication (s) are due for refill today: -  Requested medication (s) are on the active medication list: historical med  Last refill:  12/07/20  Future visit scheduled: yes  Notes to clinic:  historical med and provider   Requested Prescriptions  Pending Prescriptions Disp Refills   montelukast (SINGULAIR) 10 MG tablet [Pharmacy Med Name: MONTELUKAST SOD 10 MG TABLET] 90 tablet 3    Sig: TAKE 1 TABLET BY [facility]      Pulmonology:  Leukotriene Inhibitors Passed - 02/05/2021 12:36 PM      Passed - Valid encounter within last 12 months    Recent Outpatient Visits           2 months ago Atherosclerosis of abdominal aorta [facility])   [place], [place]   4 months ago Non-seasonal allergic rhinitis due to pollen   [facility], [name], [place]   9 months ago Vaginal atrophy   [place], [place], [place]   10 months ago Thyroid disease   [facility] [place], [name], [place]   1 year ago Thyroid disease   [place], [name], PA-C       Future Appointments             In 2 days Fisher, Kirstie Peri, MD [facility], [place]   In 2 days Marius Ditch, Tally Due, MD [place]

## 2021-02-07 ENCOUNTER — Ambulatory Visit (INDEPENDENT_AMBULATORY_CARE_PROVIDER_SITE_OTHER): Payer: Medicare Other | Admitting: Family Medicine

## 2021-02-07 ENCOUNTER — Ambulatory Visit (INDEPENDENT_AMBULATORY_CARE_PROVIDER_SITE_OTHER): Payer: Medicare Other | Admitting: Gastroenterology

## 2021-02-07 ENCOUNTER — Encounter: Payer: Self-pay | Admitting: Gastroenterology

## 2021-02-07 ENCOUNTER — Other Ambulatory Visit: Payer: Self-pay

## 2021-02-07 VITALS — BP 116/74 | HR 74 | Temp 97.6°F | Ht 67.0 in | Wt 138.5 lb

## 2021-02-07 VITALS — BP 116/59 | HR 73 | Ht 67.0 in | Wt 139.2 lb

## 2021-02-07 DIAGNOSIS — B3781 Candidal esophagitis: Secondary | ICD-10-CM | POA: Diagnosis not present

## 2021-02-07 DIAGNOSIS — Z23 Encounter for immunization: Secondary | ICD-10-CM

## 2021-02-07 DIAGNOSIS — R928 Other abnormal and inconclusive findings on diagnostic imaging of breast: Secondary | ICD-10-CM

## 2021-02-07 MED ORDER — TETANUS-DIPHTH-ACELL PERTUSSIS 5-2.5-18.5 LF-MCG/0.5 IM SUSY: 0.5000 mL | PREFILLED_SYRINGE | Freq: Once | INTRAMUSCULAR | 0 refills | Status: DC

## 2021-02-07 NOTE — Progress Notes (Signed)
Established patient visit   Patient: Kimberly Mercer   DOB: 05-11-1946   75 y.o. Female  MRN: [id number] Visit Date: 02/07/2021  Today's healthcare provider: Lelon Huh, MD   No chief complaint on file.  Subjective    HPI  She wants to discuss upcoming mammogram.  12/07/2020:  MAMMOGRAM IMPRESSION: Left breast 11:30 o'clock probably benign mass, for which short-term imaging follow-up (6 months) is recommended.   12/24/2010: Tetanus shot. She would like prescription to get tetanus vaccine from her pharmacy.   She mainly wants to get re-established since Fabio Bering is no longer here, and wants to make sure her follow up mammogram due in August doesn't fall through the cracks. Otherwise she is currently doing well with medication regiment.     Medications: Outpatient Medications Prior to Visit  Medication Sig  . acetaminophen (TYLENOL) 650 MG CR tablet Take 650 mg by mouth every 8 (eight) hours as needed for pain.   Marland Kitchen albuterol (PROVENTIL) (2.5 MG/3ML) 0.083% nebulizer solution Take 3 mLs (2.5 mg total) by nebulization every 6 (six) hours as needed for wheezing or shortness of breath. Dx:J45.909  . albuterol (VENTOLIN HFA) 108 (90 Base) MCG/ACT inhaler TAKE 2 PUFFS BY MOUTH EVERY 6 HOURS AS NEEDED FOR WHEEZE OR SHORTNESS OF BREATH  . aspirin EC 81 MG tablet Take 81 mg by mouth every evening. Swallow whole.  Marland Kitchen azelastine (ASTELIN) 0.1 % nasal spray Place 1 spray into both nostrils 2 (two) times daily. (Patient not taking: Reported on 01/03/2021)  . Calcium-Magnesium-Zinc (CAL-MAG-ZINC PO) Take 1 tablet by mouth daily.  Marland Kitchen CANNABIDIOL PO Take 30 mg by mouth daily. Capsule  . cetirizine (ZYRTEC ALLERGY) 10 MG tablet Take 1 tablet (10 mg total) by mouth daily. (Patient taking differently: Take 5 mg by mouth at bedtime.)  . cholecalciferol (VITAMIN D3) 25 MCG (1000 UNIT) tablet Take 1,000 Units by mouth daily.  . diclofenac sodium (VOLTAREN) 1 % GEL Apply 2 g topically 4 (four) times daily  as needed (pain.).  Marland Kitchen diphenhydrAMINE (BENADRYL) 25 MG tablet Take 25 mg by mouth every 6 (six) hours as needed (allergies.). (Patient not taking: Reported on 01/03/2021)  . levothyroxine (SYNTHROID) 75 MCG tablet TAKE 1 TABLET BY MOUTH EVERY DAY ON AN EMPTY STOMACH  . metoprolol succinate (TOPROL-XL) 25 MG 24 hr tablet Take 1 tablet by mouth daily.  . montelukast (SINGULAIR) 10 MG tablet TAKE 1 TABLET BY MOUTH EVERY DAY IN THE EVENING  . Multiple Vitamin (MULTIVITAMIN WITH MINERALS) TABS tablet Take 1 tablet by mouth daily.  . NONFORMULARY OR COMPOUNDED ITEM Place 0.5 g vaginally every other day. At night (Estradiol 1mg /gm cream)  . omeprazole (PRILOSEC) 40 MG capsule TAKE 1 CAPSULE (40 MG TOTAL) BY MOUTH 2 (TWO) TIMES DAILY BEFORE A MEAL.  Marland Kitchen pseudoephedrine (SUDAFED) 60 MG tablet Take 30 mg by mouth every morning.  Marland Kitchen Spacer/Aero-Holding Chambers (AEROCHAMBER MV) inhaler Use as instructed  . vitamin E 180 MG (400 UNITS) capsule Take 400 Units by mouth daily.    No facility-administered medications prior to visit.        Objective    BP (!) 116/59 (BP Location: Right Arm, Patient Position: Sitting, Cuff Size: Normal)   Pulse 73   Ht 5\' 7"  (1.702 m)   Wt 139 lb 3.2 oz (63.1 kg)   SpO2 100%   BMI 21.80 kg/m     Physical Exam  General appearance: Well developed, well nourished female, cooperative and in no acute distress  Head: Normocephalic, without obvious abnormality, atraumatic Respiratory: Respirations even and unlabored, normal respiratory rate Extremities: All extremities are intact.  Skin: Skin color, texture, turgor normal. No rashes seen  Psych: Appropriate mood and affect. Neurologic: Mental status: Alert, oriented to person, place, and time, thought content appropriate.    Assessment & Plan     1. Abnormal mammogram of left breast Due for follow up ultrasound August 2022  2. Prescription for Tdap. Vaccine not administered in office.   - Tdap ([place]) 5-2.5-18.5  LF-MCG/0.5 injection; Inject 0.5 mLs into the muscle once for 1 dose.  Dispense: 0.5 mL; Refill: 0   Also counseled regarding recommendations for shingles vaccine she she declined for now.           Lelon Huh, MD  [facility] [phone] (phone) [phone] (fax)  [place]
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
## 2021-02-09 DIAGNOSIS — Z23 Encounter for immunization: Secondary | ICD-10-CM | POA: Diagnosis not present

## 2021-02-13 ENCOUNTER — Encounter: Payer: Self-pay | Admitting: Pulmonary Disease

## 2021-02-26 ENCOUNTER — Telehealth: Payer: Self-pay | Admitting: Gastroenterology

## 2021-02-26 MED ORDER — FLUCONAZOLE 200 MG PO TABS: ORAL_TABLET | ORAL | 0 refills | Status: DC

## 2021-02-26 NOTE — Addendum Note (Signed)
Addended by: Ulyess Blossom L on: 02/26/2021 03:06 PM   Modules accepted: Orders

## 2021-02-26 NOTE — Telephone Encounter (Signed)
Kimberly Mercer  Please send in a prescription for fluconazole 400 mg on day 1 followed by 200 mg daily for 13 days Dx: Candida esophagitis  Al Gagen

## 2021-02-26 NOTE — Telephone Encounter (Signed)
Patient called to let us know she is having mouth sores, difficulty swallowing at times, pains under breast bone, indigestion and belching. Please adivse.

## 2021-02-26 NOTE — Telephone Encounter (Signed)
Sent medication to the pharmacy  

## 2021-03-04 ENCOUNTER — Other Ambulatory Visit: Payer: Self-pay | Admitting: Family Medicine

## 2021-04-02 DIAGNOSIS — J45909 Unspecified asthma, uncomplicated: Secondary | ICD-10-CM | POA: Diagnosis not present

## 2021-04-02 DIAGNOSIS — K219 Gastro-esophageal reflux disease without esophagitis: Secondary | ICD-10-CM | POA: Diagnosis not present

## 2021-04-02 DIAGNOSIS — R000 Tachycardia, unspecified: Secondary | ICD-10-CM | POA: Diagnosis not present

## 2021-04-02 DIAGNOSIS — R002 Palpitations: Secondary | ICD-10-CM | POA: Diagnosis not present

## 2021-04-02 DIAGNOSIS — J4520 Mild intermittent asthma, uncomplicated: Secondary | ICD-10-CM | POA: Diagnosis not present

## 2021-04-02 DIAGNOSIS — R0602 Shortness of breath: Secondary | ICD-10-CM | POA: Diagnosis not present

## 2021-04-10 ENCOUNTER — Other Ambulatory Visit: Payer: Self-pay | Admitting: Gastroenterology

## 2021-04-24 DIAGNOSIS — Z20822 Contact with and (suspected) exposure to covid-19: Secondary | ICD-10-CM | POA: Diagnosis not present

## 2021-05-09 ENCOUNTER — Telehealth: Payer: Self-pay

## 2021-05-09 DIAGNOSIS — R928 Other abnormal and inconclusive findings on diagnostic imaging of breast: Secondary | ICD-10-CM

## 2021-05-09 NOTE — Telephone Encounter (Signed)
Copied from [place] [phone]. Topic: General - Other >> May 09, 2021  1:37 PM Tessa Lerner A wrote: Reason for CRM: Patient has made contact requesting for orders for a diagnostic mammogram as well as an ultrasound   Patient would like the orders sent to [facility]  Please contact further if needed

## 2021-05-09 NOTE — Telephone Encounter (Signed)
According to last mamogram and [place] 6 month follow up would be needed for possible mass left breast, order has been placed and patient will be notified. KW

## 2021-05-11 ENCOUNTER — Other Ambulatory Visit: Payer: Self-pay | Admitting: Pulmonary Disease

## 2021-05-11 ENCOUNTER — Other Ambulatory Visit: Payer: Self-pay | Admitting: Family Medicine

## 2021-05-11 NOTE — Telephone Encounter (Signed)
Requested medication (s) are due for refill today - no  Requested medication (s) are on the active medication list -no  Future visit scheduled -yes  Last refill: 02/05/21  Notes to clinic: Patient requesting RF of medication no longer current on medication list- sent for review   Requested Prescriptions  Pending Prescriptions Disp Refills   montelukast (SINGULAIR) 10 MG tablet [Pharmacy Med Name: MONTELUKAST SOD 10 MG TABLET] 30 tablet 0    Sig: TAKE 1 TABLET BY MOUTH EVERY DAY IN THE EVENING      Pulmonology:  Leukotriene Inhibitors Passed - 05/11/2021  2:30 PM      Passed - Valid encounter within last 12 months    Recent Outpatient Visits           3 months ago Abnormal mammogram of left breast   [facility] Birdie Sons, MD   5 months ago Atherosclerosis of abdominal aorta [facility])   [place], PA-C   7 months ago Non-seasonal allergic rhinitis due to pollen   [facility], [name], [place]   1 year ago Vaginal atrophy   [facility] [place], [name], [place]   1 year ago Thyroid disease   [facility] [place], [name], [place]       Future Appointments             In 1 month Fisher, Kirstie Peri, MD [facility], [place]                 Requested Prescriptions  Pending Prescriptions Disp Refills   montelukast (SINGULAIR) 10 MG tablet [Pharmacy Med Name: MONTELUKAST SOD 10 MG TABLET] 30 tablet 0    Sig: TAKE 1 TABLET BY MOUTH EVERY DAY IN THE EVENING      Pulmonology:  Leukotriene Inhibitors Passed - 05/11/2021  2:30 PM      Passed - Valid encounter within last 12 months    Recent Outpatient Visits           3 months ago Abnormal mammogram of left breast   [facility] Birdie Sons, MD   5 months ago Atherosclerosis of abdominal aorta [facility])   [place], [place]   7 months ago Non-seasonal  allergic rhinitis due to pollen   [facility], [name], [place]   1 year ago Vaginal atrophy   [place], [name], [place]   1 year ago Thyroid disease   [facility] [place], [name], [place]       Future Appointments             In 1 month Fisher, Kirstie Peri, MD [facility], [place]

## 2021-06-01 ENCOUNTER — Other Ambulatory Visit: Payer: Self-pay

## 2021-06-01 ENCOUNTER — Ambulatory Visit (INDEPENDENT_AMBULATORY_CARE_PROVIDER_SITE_OTHER): Payer: Medicare Other | Admitting: Family Medicine

## 2021-06-01 ENCOUNTER — Encounter: Payer: Self-pay | Admitting: Family Medicine

## 2021-06-01 VITALS — BP 109/64 | HR 67 | Temp 97.6°F | Resp 16 | Ht 67.0 in | Wt 137.0 lb

## 2021-06-01 DIAGNOSIS — E079 Disorder of thyroid, unspecified: Secondary | ICD-10-CM | POA: Diagnosis not present

## 2021-06-01 DIAGNOSIS — E781 Pure hyperglyceridemia: Secondary | ICD-10-CM

## 2021-06-01 DIAGNOSIS — I700 Atherosclerosis of aorta: Secondary | ICD-10-CM | POA: Diagnosis not present

## 2021-06-01 NOTE — Progress Notes (Signed)
Established patient visit   Patient: Kimberly Mercer   DOB: 06-19-1946   75 y.o. Female  MRN: [id number] Visit Date: 06/01/2021  Today's healthcare provider: Lelon Huh, MD   Chief Complaint  Patient presents with   Follow-up   Subjective    HPI  Follow up for abnormal mammogram of left breast  The patient was last seen for this 4 months ago. Changes made at last visit include no changes. Due for repeat US. Patient reports that she has this scheduled next week.   Hypothyroid, follow-up  Lab Results  Component Value Date   TSH 1.130 03/21/2020   TSH 1.050 07/22/2019   TSH 0.863 07/01/2018   T4TOTAL 7.1 03/21/2020   Wt Readings from Last 3 Encounters:  06/01/21 137 lb (62.1 kg)  02/07/21 138 lb 8 oz (62.8 kg)  02/07/21 139 lb 3.2 oz (63.1 kg)    She was last seen for hypothyroid 4 months ago.  Management since that visit includes no medication changes. She reports good compliance with treatment. She is not having side effects.   Symptoms: No change in energy level No constipation  No diarrhea No heat / cold intolerance  No nervousness No palpitations  No weight changes    -----------------------------------------------------------------------------------------     Medications: Outpatient Medications Prior to Visit  Medication Sig   acetaminophen (TYLENOL) 650 MG CR tablet Take 650 mg by mouth every 8 (eight) hours as needed for pain.    albuterol (PROVENTIL) (2.5 MG/3ML) 0.083% nebulizer solution Take 3 mLs (2.5 mg total) by nebulization every 6 (six) hours as needed for wheezing or shortness of breath. Dx:J45.909   albuterol (VENTOLIN HFA) 108 (90 Base) MCG/ACT inhaler TAKE 2 PUFFS BY MOUTH EVERY 6 HOURS AS NEEDED FOR WHEEZE OR SHORTNESS OF BREATH   aspirin EC 81 MG tablet Take 81 mg by mouth every evening. Swallow whole.   azelastine (ASTELIN) 0.1 % nasal spray Place 1 spray into both nostrils 2 (two) times daily.   Calcium-Magnesium-Zinc  (CAL-MAG-ZINC PO) Take 1 tablet by mouth daily.   CANNABIDIOL PO Take 30 mg by mouth daily. Capsule   cetirizine (ZYRTEC ALLERGY) 10 MG tablet Take 1 tablet (10 mg total) by mouth daily. (Patient taking differently: Take 5 mg by mouth at bedtime.)   cholecalciferol (VITAMIN D3) 25 MCG (1000 UNIT) tablet Take 1,000 Units by mouth daily.   diclofenac sodium (VOLTAREN) 1 % GEL Apply 2 g topically 4 (four) times daily as needed (pain.).   diphenhydrAMINE (BENADRYL) 25 MG tablet Take 25 mg by mouth every 6 (six) hours as needed (allergies.).   fluconazole (DIFLUCAN) 200 MG tablet Take '400mg'$  on day 1 and then 1 tablet daily for 13 days   levothyroxine (SYNTHROID) 75 MCG tablet TAKE 1 TABLET BY MOUTH EVERY DAY ON AN EMPTY STOMACH   montelukast (SINGULAIR) 10 MG tablet TAKE 1 TABLET BY MOUTH EVERY DAY IN THE EVENING   Multiple Vitamin (MULTIVITAMIN WITH MINERALS) TABS tablet Take 1 tablet by mouth daily.   NONFORMULARY OR COMPOUNDED ITEM Place 0.5 g vaginally every other day. At night (Estradiol '1mg'$ /gm cream)   omeprazole (PRILOSEC) 40 MG capsule TAKE 1 CAPSULE (40 MG TOTAL) BY MOUTH 2 (TWO) TIMES DAILY BEFORE A MEAL.   pseudoephedrine (SUDAFED) 60 MG tablet Take 30 mg by mouth every morning.   Spacer/Aero-Holding Chambers (AEROCHAMBER MV) inhaler Use as instructed   vitamin E 180 MG (400 UNITS) capsule Take 400 Units by mouth daily.  No facility-administered medications prior to visit.    Review of Systems  Constitutional:  Negative for activity change and fatigue.  Respiratory:  Negative for cough and shortness of breath.   Cardiovascular:  Negative for chest pain, palpitations and leg swelling.  Endocrine: Negative for cold intolerance, heat intolerance, polydipsia, polyphagia and polyuria.  Neurological:  Negative for dizziness, light-headedness and headaches.  Psychiatric/Behavioral:  Negative for agitation.       Objective    BP 109/64   Pulse 67   Temp 97.6 F (36.4 C)   Resp 16    Ht '5\' 7"'$  (1.702 m)   Wt 137 lb (62.1 kg)   BMI 21.46 kg/m     Physical Exam  General appearance: Well developed, well nourished female, cooperative and in no acute distress Head: Normocephalic, without obvious abnormality, atraumatic Respiratory: Respirations even and unlabored, normal respiratory rate Extremities: All extremities are intact.  Skin: Skin color, texture, turgor normal. No rashes seen  Psych: Appropriate mood and affect. Neurologic: Mental status: Alert, oriented to person, place, and time, thought content appropriate.     Assessment & Plan     1. Thyroid disease  - T4, free - TSH  2. Thoracic aortic atherosclerosis ([place]) Noted on abdominal CT earlier this year. Otherwise low risk for vascular disease.  - Lipid panel  3. Hypertriglyceridemia  - Comprehensive metabolic panel   Reviewed HM. She is scheduled for mammogram. Has no intention of ever having another colonoscopy due to bad experience with the last one.      The entirety of the information documented in the History of Present Illness, Review of Systems and Physical Exam were personally obtained by me. Portions of this information were initially documented by the CMA and reviewed by me for thoroughness and accuracy.     Lelon Huh, MD  [facility] [phone] (phone) [phone] (fax)  [place]

## 2021-06-06 ENCOUNTER — Other Ambulatory Visit: Payer: Self-pay

## 2021-06-06 ENCOUNTER — Ambulatory Visit
Admission: RE | Admit: 2021-06-06 | Discharge: 2021-06-06 | Disposition: A | Discharge status: Home / Self Care | Payer: Medicare Other | Source: Ambulatory Visit | Attending: Family Medicine | Admitting: Family Medicine

## 2021-06-06 DIAGNOSIS — R928 Other abnormal and inconclusive findings on diagnostic imaging of breast: Secondary | ICD-10-CM

## 2021-06-06 DIAGNOSIS — R922 Inconclusive mammogram: Secondary | ICD-10-CM | POA: Diagnosis not present

## 2021-06-09 ENCOUNTER — Other Ambulatory Visit: Payer: Self-pay | Admitting: Family Medicine

## 2021-06-09 NOTE — Telephone Encounter (Signed)
Requested Prescriptions  Pending Prescriptions Disp Refills  . montelukast (SINGULAIR) 10 MG tablet [Pharmacy Med Name: MONTELUKAST SOD 10 MG TABLET] 30 tablet 0    Sig: TAKE 1 TABLET BY MOUTH EVERY DAY IN THE EVENING     Pulmonology:  Leukotriene Inhibitors Passed - 06/09/2021 11:32 AM      Passed - Valid encounter within last 12 months    Recent Outpatient Visits          1 week ago Thyroid disease   [place], [place]   4 months ago Abnormal mammogram of left breast   [facility] Birdie Sons, MD   6 months ago Atherosclerosis of abdominal aorta [facility])   [place], [place]   8 months ago Non-seasonal allergic rhinitis due to pollen   [facility], [name], [place]   1 year ago Vaginal atrophy   [facility] [place], [place], [place]

## 2021-06-13 ENCOUNTER — Ambulatory Visit: Payer: Self-pay | Admitting: Family Medicine

## 2021-06-18 ENCOUNTER — Telehealth: Payer: Self-pay

## 2021-06-18 NOTE — Telephone Encounter (Signed)
Copied from [place] [id number]. Topic: General - Other >> Jun 18, 2021  7:39 AM Kimberly Mercer wrote: Reason for CRM: Patient called in to inform Dr Caryn Section that she and her husband have been very sick and think it was Covid and recently suffered lost of taste. Patient wanted to inform dr Caryn Section that she have not been tested but due to her symptoms she feels like its the new strain of Covid. Per patient she will wait until she feels better to come in and have labs done. Just wanted to inform Dr Caryn Section.  Ph# [phone]

## 2021-06-18 NOTE — Telephone Encounter (Signed)
FYI

## 2021-06-19 ENCOUNTER — Other Ambulatory Visit: Payer: Self-pay

## 2021-06-19 DIAGNOSIS — N952 Postmenopausal atrophic vaginitis: Secondary | ICD-10-CM

## 2021-06-19 NOTE — Telephone Encounter (Signed)
Copied from [place] [id number]. Topic: General - Other >> Jun 19, 2021  7:57 AM Leward Quan A wrote: Reason for CRM: Patient called in to request a refill on her Estradiol Vaginal Cream did not see it on her medication list stated that it was last filled in April of 2022. Say she called in the refill over a week ago and is almost out asking Dr Caryn Section to please send an Rx to her pharmacy. Call patient with questions and concerns

## 2021-06-20 ENCOUNTER — Telehealth: Payer: Self-pay | Admitting: Family Medicine

## 2021-06-20 DIAGNOSIS — N952 Postmenopausal atrophic vaginitis: Secondary | ICD-10-CM

## 2021-06-20 MED ORDER — ESTRADIOL 0.1 MG/GM VA CREA
1.0000 | TOPICAL_CREAM | VAGINAL | 12 refills | Status: DC
Start: 2021-06-20 — End: 2021-06-20

## 2021-06-20 MED ORDER — ESTRADIOL 0.1 MG/GM VA CREA: 1.0000 | TOPICAL_CREAM | VAGINAL | 12 refills | Status: DC

## 2021-06-20 NOTE — Telephone Encounter (Signed)
I've never heard of estraiol. Only thing in her chart is estradiole. Please have pharmacy send copy of previous prescription

## 2021-06-20 NOTE — Telephone Encounter (Signed)
Pt called and pharmacy still hasnt received RX/ this is due to the RX being sent to [facility] and she stated it needs to go to  [place], [place] Phone:  [phone]  Fax:  [phone]    because it is a compound drug and they are the ones that do this / please send asap and advise

## 2021-06-20 NOTE — Telephone Encounter (Signed)
estradiol (ESTRACE) 0.1 MG/GM vaginal cream 42.5 g 12 06/20/2021    Sig - Route: Place 1 Applicatorful vaginally    Dr Caryn Section called this in today... in the past he has called in [place] without the d. It is entirely different and instructions are also different as they compound. Pls have clinical fu with Seth Bake at [phone] as believes simple error.  [place], [place] [address] [place] [address]  Phone: [phone] Fax: [phone]  Hours: Not open 24 hours

## 2021-06-20 NOTE — Addendum Note (Signed)
Addended by: Matilde Sprang on: 06/20/2021 12:38 PM   Modules accepted: Orders

## 2021-06-21 ENCOUNTER — Encounter: Payer: Self-pay | Admitting: Family Medicine

## 2021-06-21 NOTE — Telephone Encounter (Signed)
Please review

## 2021-06-21 NOTE — Telephone Encounter (Signed)
Please see message from Dr. Caryn Section below. Pt called back because she is out of the medication and is hoping to get this before the holiday weekend. Since Dr. Maralyn Sago CMA's are out of the office today is it possible someone else can contact Kimberly Mercer and get Rx in and send in or give verbal order since Dr. Caryn Section has already noted ok to do? Thanks TNP

## 2021-06-21 NOTE — Telephone Encounter (Signed)
Patient called in and is having some issues that her compound medication have not been called in to [facility] she was informed of the situation but states that she will be calling in the morning for an update just to make sure that her medication is called into the pharmacy. Please be advised

## 2021-06-21 NOTE — Telephone Encounter (Signed)
Seth Bake with [facility] is calling pt is on estriol compound version 0.5 gram pt use intravaginally daily

## 2021-06-21 NOTE — Telephone Encounter (Addendum)
I don't have any documentation of previous estriole prescription since it was prescribed by her Gyn. Ok to call in refill the same as the last prescription the pharmacy has on file, but please clarify EXACTLY the  strength, quantity and refills in the Compounded Item prescription.

## 2021-06-22 MED ORDER — NONFORMULARY OR COMPOUNDED ITEM: 12 refills | Status: DC

## 2021-06-22 NOTE — Telephone Encounter (Signed)
I called and spoke with pharmacist. I gave verbal ok to refill prescription the same as last refill. Per pharmacist the previous prescription was:   Estriol compound '1mg'$ /g,  directions: insert 0.5 grams vaginally daily as directed. Dispensed: 30 grams, with 5 refills  Pharmacist states that she sent a fax to our office for Dr. Caryn Section to sign for refills. I spoke with Dr. Caryn Section and her confirmed that he received and signed the fax.

## 2021-06-27 ENCOUNTER — Other Ambulatory Visit: Payer: Self-pay | Admitting: Physician Assistant

## 2021-07-09 DIAGNOSIS — E781 Pure hyperglyceridemia: Secondary | ICD-10-CM | POA: Diagnosis not present

## 2021-07-09 DIAGNOSIS — E079 Disorder of thyroid, unspecified: Secondary | ICD-10-CM | POA: Diagnosis not present

## 2021-07-10 ENCOUNTER — Other Ambulatory Visit: Payer: Self-pay | Admitting: Physician Assistant

## 2021-07-10 DIAGNOSIS — E079 Disorder of thyroid, unspecified: Secondary | ICD-10-CM

## 2021-07-10 LAB — COMPREHENSIVE METABOLIC PANEL
ALT: 20 IU/L (ref 0–32)
AST: 31 IU/L (ref 0–40)
Albumin/Globulin Ratio: 2.4 — ABNORMAL HIGH (ref 1.2–2.2)
Albumin: 4.5 g/dL (ref 3.7–4.7)
Alkaline Phosphatase: 81 IU/L (ref 44–121)
BUN/Creatinine Ratio: 25 (ref 12–28)
BUN: 18 mg/dL (ref 8–27)
Bilirubin Total: <0.2 mg/dL (ref 0.0–1.2)
CO2: 25 mmol/L (ref 20–29)
Calcium: 8.9 mg/dL (ref 8.7–10.3)
Chloride: 104 mmol/L (ref 96–106)
Creatinine, Ser: 0.72 mg/dL (ref 0.57–1.00)
Globulin, Total: 1.9 g/dL (ref 1.5–4.5)
Glucose: 96 mg/dL (ref 65–99)
Potassium: 4.1 mmol/L (ref 3.5–5.2)
Sodium: 140 mmol/L (ref 134–144)
Total Protein: 6.4 g/dL (ref 6.0–8.5)
eGFR: 88 mL/min/{1.73_m2} (ref >59)

## 2021-07-10 LAB — LIPID PANEL
Chol/HDL Ratio: 2.6 ratio (ref 0.0–4.4)
Cholesterol, Total: 193 mg/dL (ref 100–199)
HDL: 75 mg/dL (ref >39)
LDL Chol Calc (NIH): 100 mg/dL — ABNORMAL HIGH (ref 0–99)
Triglycerides: 99 mg/dL (ref 0–149)
VLDL Cholesterol Cal: 18 mg/dL (ref 5–40)

## 2021-07-10 LAB — T4, FREE: Free T4: 1.25 ng/dL (ref 0.82–1.77)

## 2021-07-10 LAB — TSH: TSH: 1.45 u[IU]/mL (ref 0.450–4.500)

## 2021-07-12 ENCOUNTER — Telehealth: Payer: Self-pay | Admitting: Gastroenterology

## 2021-07-12 ENCOUNTER — Other Ambulatory Visit: Payer: Self-pay | Admitting: Family Medicine

## 2021-07-12 MED ORDER — FLUCONAZOLE 200 MG PO TABS: ORAL_TABLET | ORAL | 0 refills | Status: AC

## 2021-07-12 NOTE — Telephone Encounter (Signed)
Patient states she has been treated twice for yeast infections in the esophagus. She states she thinks it has came back. She states this time her symptoms have been going on for 1 week and has came on more gradual. She states she is having Dysphagia, bad taste in mouth, burping, indigestion, breast bone pain, mouth sores. Please advise what you recommend

## 2021-07-12 NOTE — Telephone Encounter (Signed)
Kimberly Mercer   Please send in a prescription for fluconazole 400 mg on day 1 followed by 200 mg daily for 13 days  Dx: Candida esophagitis

## 2021-07-12 NOTE — Telephone Encounter (Signed)
Pt. Calling she said she is certain that her infection has returned. She said she has some of the same symptoms that she was treated for before. She said she would like a call bak to discuss options

## 2021-07-12 NOTE — Telephone Encounter (Signed)
Sent medication to the pharmacy. Patient verbalized understanding

## 2021-07-12 NOTE — Addendum Note (Signed)
Addended by: Ulyess Blossom L on: 07/12/2021 01:47 PM   Modules accepted: Orders

## 2021-07-16 NOTE — Telephone Encounter (Signed)
LOV: 06/01/2021 NOV: None   Last Refill:

## 2021-07-16 NOTE — Telephone Encounter (Signed)
Please call the pharmacy about this. It should be estriole 1mg /gm which is compounded and cannot be sent electronically. (See nonformulary or compounded item entered 06/22/2021)

## 2021-07-18 NOTE — Telephone Encounter (Signed)
I called [facility] and was advised that this prescriptions was called in 3 weeks ago.

## 2021-08-19 DIAGNOSIS — Z23 Encounter for immunization: Secondary | ICD-10-CM | POA: Diagnosis not present

## 2021-08-24 DIAGNOSIS — Z23 Encounter for immunization: Secondary | ICD-10-CM | POA: Diagnosis not present

## 2021-10-04 DIAGNOSIS — R0602 Shortness of breath: Secondary | ICD-10-CM | POA: Diagnosis not present

## 2021-10-04 DIAGNOSIS — K219 Gastro-esophageal reflux disease without esophagitis: Secondary | ICD-10-CM | POA: Diagnosis not present

## 2021-10-04 DIAGNOSIS — R000 Tachycardia, unspecified: Secondary | ICD-10-CM | POA: Diagnosis not present

## 2021-10-04 DIAGNOSIS — R002 Palpitations: Secondary | ICD-10-CM | POA: Diagnosis not present

## 2021-10-04 DIAGNOSIS — R251 Tremor, unspecified: Secondary | ICD-10-CM | POA: Diagnosis not present

## 2021-10-04 DIAGNOSIS — J4520 Mild intermittent asthma, uncomplicated: Secondary | ICD-10-CM | POA: Diagnosis not present

## 2021-10-08 ENCOUNTER — Encounter: Payer: Self-pay | Admitting: Gastroenterology

## 2021-10-08 DIAGNOSIS — R197 Diarrhea, unspecified: Secondary | ICD-10-CM

## 2021-10-09 DIAGNOSIS — R197 Diarrhea, unspecified: Secondary | ICD-10-CM | POA: Diagnosis not present

## 2021-10-11 ENCOUNTER — Encounter: Payer: Self-pay | Admitting: Gastroenterology

## 2021-10-11 ENCOUNTER — Ambulatory Visit (INDEPENDENT_AMBULATORY_CARE_PROVIDER_SITE_OTHER): Payer: Medicare Other | Admitting: Gastroenterology

## 2021-10-11 VITALS — BP 122/71 | HR 73 | Temp 98.0°F | Ht 67.0 in | Wt 135.4 lb

## 2021-10-11 DIAGNOSIS — R197 Diarrhea, unspecified: Secondary | ICD-10-CM
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
## 2021-10-14 LAB — GI PROFILE, STOOL, PCR

## 2021-10-17 ENCOUNTER — Encounter: Payer: Self-pay | Admitting: Gastroenterology

## 2021-10-25 ENCOUNTER — Telehealth: Payer: Self-pay | Admitting: Family Medicine

## 2021-10-25 DIAGNOSIS — R928 Other abnormal and inconclusive findings on diagnostic imaging of breast: Secondary | ICD-10-CM

## 2021-10-25 DIAGNOSIS — Z1231 Encounter for screening mammogram for malignant neoplasm of breast: Secondary | ICD-10-CM

## 2021-10-25 NOTE — Telephone Encounter (Signed)
Copied from [place] [id number]. Topic: Referral - Request for Referral >> Oct 25, 2021  2:08 PM Leward Quan A wrote: Has patient seen PCP for this complaint? Yes.   *If NO, is insurance requiring patient see PCP for this issue before PCP can refer them? Referral for which specialty: Radiology Preferred provider/office: [facility]  Reason for referral: Diagnostic Mammogram

## 2021-10-26 ENCOUNTER — Other Ambulatory Visit: Payer: Self-pay

## 2021-10-26 DIAGNOSIS — R1013 Epigastric pain: Secondary | ICD-10-CM

## 2021-10-28 ENCOUNTER — Encounter: Payer: Self-pay | Admitting: Gastroenterology

## 2021-10-29 NOTE — Telephone Encounter (Signed)
Called ENDO and talk to Gena and she will get egd cancel

## 2021-10-31 ENCOUNTER — Ambulatory Visit: Admission: RE | Admit: 2021-10-31 | Payer: Medicare Other | Source: Home / Self Care | Admitting: Gastroenterology

## 2021-10-31 ENCOUNTER — Encounter: Admission: RE | Payer: Self-pay | Source: Home / Self Care

## 2021-10-31 SURGERY — ESOPHAGOGASTRODUODENOSCOPY (EGD) WITH PROPOFOL
Anesthesia: General

## 2021-11-12 ENCOUNTER — Ambulatory Visit (INDEPENDENT_AMBULATORY_CARE_PROVIDER_SITE_OTHER): Payer: Medicare Other | Admitting: Gastroenterology

## 2021-11-12 ENCOUNTER — Encounter: Payer: Self-pay | Admitting: Gastroenterology

## 2021-11-12 VITALS — BP 123/75 | HR 67 | Temp 97.9°F | Ht 67.0 in | Wt 134.5 lb

## 2021-11-12 DIAGNOSIS — H25013 Cortical age-related cataract, bilateral: Secondary | ICD-10-CM | POA: Diagnosis not present

## 2021-11-12 DIAGNOSIS — M546 Pain in thoracic spine: Secondary | ICD-10-CM | POA: Diagnosis not present

## 2021-11-12 DIAGNOSIS — H2513 Age-related nuclear cataract, bilateral: Secondary | ICD-10-CM | POA: Diagnosis not present

## 2021-11-12 DIAGNOSIS — H1045 Other chronic allergic conjunctivitis: Secondary | ICD-10-CM | POA: Diagnosis not present

## 2021-11-12 DIAGNOSIS — H43811 Vitreous degeneration, right eye: Secondary | ICD-10-CM | POA: Diagnosis not present
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
## 2021-11-28 ENCOUNTER — Other Ambulatory Visit: Payer: Self-pay | Admitting: Gastroenterology

## 2021-12-10 ENCOUNTER — Ambulatory Visit
Admission: RE | Admit: 2021-12-10 | Discharge: 2021-12-10 | Disposition: A | Discharge status: Home / Self Care | Payer: Medicare Other | Source: Ambulatory Visit | Attending: Family Medicine | Admitting: Family Medicine

## 2021-12-10 ENCOUNTER — Other Ambulatory Visit: Payer: Self-pay

## 2021-12-10 DIAGNOSIS — R928 Other abnormal and inconclusive findings on diagnostic imaging of breast: Secondary | ICD-10-CM | POA: Diagnosis not present

## 2021-12-10 DIAGNOSIS — Z1231 Encounter for screening mammogram for malignant neoplasm of breast: Secondary | ICD-10-CM | POA: Diagnosis not present

## 2021-12-10 DIAGNOSIS — R922 Inconclusive mammogram: Secondary | ICD-10-CM | POA: Diagnosis not present

## 2021-12-26 ENCOUNTER — Other Ambulatory Visit: Payer: Self-pay | Admitting: Family Medicine

## 2021-12-27 ENCOUNTER — Other Ambulatory Visit: Payer: Self-pay | Admitting: Physician Assistant

## 2021-12-27 DIAGNOSIS — J45909 Unspecified asthma, uncomplicated: Secondary | ICD-10-CM

## 2022-01-01 ENCOUNTER — Other Ambulatory Visit: Payer: Self-pay | Admitting: Family Medicine

## 2022-01-01 DIAGNOSIS — J45909 Unspecified asthma, uncomplicated: Secondary | ICD-10-CM

## 2022-01-01 NOTE — Telephone Encounter (Signed)
Requested medication (s) are due for refill today: expired medication ? ?Requested medication (s) are on the active medication list: yes ? ?Last refill:  12/15/20 #18 each 1 refill ? ?Future visit scheduled: no ? ?Notes to clinic:  expired medication  do you want to renew Rx? ? ? ?  ?Requested Prescriptions  ?Pending Prescriptions Disp Refills  ? albuterol (VENTOLIN HFA) 108 (90 Base) MCG/ACT inhaler 18 each 1  ?  Sig: TAKE 2 PUFFS BY MOUTH EVERY 6 HOURS AS NEEDED FOR WHEEZE OR SHORTNESS OF BREATH  ?  ? Pulmonology:  Beta Agonists 2 Passed - 01/01/2022  2:03 PM  ?  ?  Passed - Last BP in normal range  ?  BP Readings from Last 1 Encounters:  ?11/12/21 123/75  ?  ?  ?  ?  Passed - Last Heart Rate in normal range  ?  Pulse Readings from Last 1 Encounters:  ?11/12/21 67  ?  ?  ?  ?  Passed - Valid encounter within last 12 months  ?  Recent Outpatient Visits   ? ?      ? 7 months ago Thyroid disease  ? [facility] Caryn Section, Kirstie Peri, MD  ? 10 months ago Abnormal mammogram of left breast  ? [facility] Birdie Sons, MD  ? 1 year ago Atherosclerosis of abdominal aorta [facility])  ? [place], [place]  ? 1 year ago Non-seasonal allergic rhinitis due to pollen  ? [place], [place]  ? 1 year ago Vaginal atrophy  ? [facility], [place] M, [place]  ? ?  ?  ? ?  ?  ?  ? ?

## 2022-01-01 NOTE — Telephone Encounter (Signed)
Copied from [place] [phone]. Topic: Quick Communication - Rx Refill/Question ?>> Jan 01, 2022 12:45 PM Lenon Curt, Everette A wrote: ?Medication: albuterol (VENTOLIN HFA) 108 (90 Base) MCG/ACT inhaler [[id number]]  ? ?Has the patient contacted their pharmacy? Yes.  The patient has had previous issues with their prescription being filled  ?(Agent: If no, request that the patient contact the pharmacy for the refill. If patient does not wish to contact the pharmacy document the reason why and proceed with request.) ?(Agent: If yes, when and what did the pharmacy advise?) ? ?Preferred Pharmacy (with phone number or street name): [facility] [address] [address] ?Phone: [phone] Fax: [phone] ?Hours: Not open 24 hours ? ?Has the patient been seen for an appointment in the last year OR does the patient have an upcoming appointment? Yes.   ? ?Agent: Please be advised that RX refills may take up to 3 business days. We ask that you follow-up with your pharmacy. ?

## 2022-01-02 MED ORDER — ALBUTEROL SULFATE HFA 108 (90 BASE) MCG/ACT IN AERS: INHALATION_SPRAY | RESPIRATORY_TRACT | 3 refills | Status: DC

## 2022-01-11 DIAGNOSIS — H2513 Age-related nuclear cataract, bilateral: Secondary | ICD-10-CM | POA: Diagnosis not present

## 2022-01-11 DIAGNOSIS — H40023 Open angle with borderline findings, high risk, bilateral: Secondary | ICD-10-CM | POA: Diagnosis not present

## 2022-01-23 ENCOUNTER — Ambulatory Visit (INDEPENDENT_AMBULATORY_CARE_PROVIDER_SITE_OTHER): Payer: Medicare Other

## 2022-01-23 VITALS — Wt 134.0 lb

## 2022-01-23 DIAGNOSIS — Z0000 Encounter for general adult medical examination without abnormal findings: Secondary | ICD-10-CM | POA: Diagnosis not present

## 2022-01-23 NOTE — Patient Instructions (Addendum)
Kimberly Mercer , ?Thank you for taking time to come for your Medicare Wellness Visit. I appreciate your ongoing commitment to your health goals. Please review the following plan we discussed and let me know if I can assist you in the future.  ? ?Screening recommendations/referrals: ?Colonoscopy: declined referral ?Mammogram: 12/10/21 ?Bone Density:n/d ?Recommended yearly ophthalmology/optometry visit for glaucoma screening and checkup ?Recommended yearly dental visit for hygiene and checkup ? ?Vaccinations: ?Influenza vaccine: n/d ?Pneumococcal vaccine: 11/21/20 ?Tdap vaccine: 12/24/10, due ?Shingles vaccine: Zostavax 05/01/13   ?Covid-19:11/27/19, 12/20/19, 07/25/20  ? ?Advanced directives: no ? ?Conditions/risks identified: none ? ?Next appointment: Follow up in one year for your annual wellness visit - 01/29/23 @ 1pm by phone ? ? ?Preventive Care 74 Years and Older, Female ?Preventive care refers to lifestyle choices and visits with your health care provider that can promote health and wellness. ?What does preventive care include? ?A yearly physical exam. This is also called an annual well check. ?Dental exams once or twice a year. ?Routine eye exams. Ask your health care provider how often you should have your eyes checked. ?Personal lifestyle choices, including: ?Daily care of your teeth and gums. ?Regular physical activity. ?Eating a healthy diet. ?Avoiding tobacco and drug use. ?Limiting alcohol use. ?Practicing safe sex. ?Taking low-dose aspirin every day. ?Taking vitamin and mineral supplements as recommended by your health care provider. ?What happens during an annual well check? ?The services and screenings done by your health care provider during your annual well check will depend on your age, overall health, lifestyle risk factors, and family history of disease. ?Counseling  ?Your health care provider may ask you questions about your: ?Alcohol use. ?Tobacco use. ?Drug use. ?Emotional well-being. ?Home and relationship  well-being. ?Sexual activity. ?Eating habits. ?History of falls. ?Memory and ability to understand (cognition). ?Work and work Statistician. ?Reproductive health. ?Screening  ?You may have the following tests or measurements: ?Height, weight, and BMI. ?Blood pressure. ?Lipid and cholesterol levels. These may be checked every 5 years, or more frequently if you are over 35 years old. ?Skin check. ?Lung cancer screening. You may have this screening every year starting at age 12 if you have a 30-pack-year history of smoking and currently smoke or have quit within the past 15 years. ?Fecal occult blood test (FOBT) of the stool. You may have this test every year starting at age 45. ?Flexible sigmoidoscopy or colonoscopy. You may have a sigmoidoscopy every 5 years or a colonoscopy every 10 years starting at age 34. ?Hepatitis C blood test. ?Hepatitis B blood test. ?Sexually transmitted disease (STD) testing. ?Diabetes screening. This is done by checking your blood sugar (glucose) after you have not eaten for a while (fasting). You may have this done every 1-3 years. ?Bone density scan. This is done to screen for osteoporosis. You may have this done starting at age 40. ?Mammogram. This may be done every 1-2 years. Talk to your health care provider about how often you should have regular mammograms. ?Talk with your health care provider about your test results, treatment options, and if necessary, the need for more tests. ?Vaccines  ?Your health care provider may recommend certain vaccines, such as: ?Influenza vaccine. This is recommended every year. ?Tetanus, diphtheria, and acellular pertussis (Tdap, Td) vaccine. You may need a Td booster every 10 years. ?Zoster vaccine. You may need this after age 72. ?Pneumococcal 13-valent conjugate (PCV13) vaccine. One dose is recommended after age 75. ?Pneumococcal polysaccharide (PPSV23) vaccine. One dose is recommended after age 34. ?Talk to  your health care provider about which  screenings and vaccines you need and how often you need them. ?This information is not intended to replace advice given to you by your health care provider. Make sure you discuss any questions you have with your health care provider. ?Document Released: 11/03/2015 Document Revised: 06/26/2016 Document Reviewed: 08/08/2015 ?Elsevier Interactive Patient Education ? 2017 [employer]. ? ?Fall Prevention in the Home ?Falls can cause injuries. They can happen to people of all ages. There are many things you can do to make your home safe and to help prevent falls. ?What can I do on the outside of my home? ?Regularly fix the edges of walkways and driveways and fix any cracks. ?Remove anything that might make you trip as you walk through a door, such as a raised step or threshold. ?Trim any bushes or trees on the path to your home. ?Use bright outdoor lighting. ?Clear any walking paths of anything that might make someone trip, such as rocks or tools. ?Regularly check to see if handrails are loose or broken. Make sure that both sides of any steps have handrails. ?Any raised decks and porches should have guardrails on the edges. ?Have any leaves, snow, or ice cleared regularly. ?Use sand or salt on walking paths during winter. ?Clean up any spills in your garage right away. This includes oil or grease spills. ?What can I do in the bathroom? ?Use night lights. ?Install grab bars by the toilet and in the tub and shower. Do not use towel bars as grab bars. ?Use non-skid mats or decals in the tub or shower. ?If you need to sit down in the shower, use a plastic, non-slip stool. ?Keep the floor dry. Clean up any water that spills on the floor as soon as it happens. ?Remove soap buildup in the tub or shower regularly. ?Attach bath mats securely with double-sided non-slip rug tape. ?Do not have throw rugs and other things on the floor that can make you trip. ?What can I do in the bedroom? ?Use night lights. ?Make sure that you have a  light by your bed that is easy to reach. ?Do not use any sheets or blankets that are too big for your bed. They should not hang down onto the floor. ?Have a firm chair that has side arms. You can use this for support while you get dressed. ?Do not have throw rugs and other things on the floor that can make you trip. ?What can I do in the kitchen? ?Clean up any spills right away. ?Avoid walking on wet floors. ?Keep items that you use a lot in easy-to-reach places. ?If you need to reach something above you, use a strong step stool that has a grab bar. ?Keep electrical cords out of the way. ?Do not use floor polish or wax that makes floors slippery. If you must use wax, use non-skid floor wax. ?Do not have throw rugs and other things on the floor that can make you trip. ?What can I do with my stairs? ?Do not leave any items on the stairs. ?Make sure that there are handrails on both sides of the stairs and use them. Fix handrails that are broken or loose. Make sure that handrails are as long as the stairways. ?Check any carpeting to make sure that it is firmly attached to the stairs. Fix any carpet that is loose or worn. ?Avoid having throw rugs at the top or bottom of the stairs. If you do have throw rugs, attach  them to the floor with carpet tape. ?Make sure that you have a light switch at the top of the stairs and the bottom of the stairs. If you do not have them, ask someone to add them for you. ?What else can I do to help prevent falls? ?Wear shoes that: ?Do not have high heels. ?Have rubber bottoms. ?Are comfortable and fit you well. ?Are closed at the toe. Do not wear sandals. ?If you use a stepladder: ?Make sure that it is fully opened. Do not climb a closed stepladder. ?Make sure that both sides of the stepladder are locked into place. ?Ask someone to hold it for you, if possible. ?Clearly mark and make sure that you can see: ?Any grab bars or handrails. ?First and last steps. ?Where the edge of each step  is. ?Use tools that help you move around (mobility aids) if they are needed. These include: ?Canes. ?Walkers. ?Scooters. ?Crutches. ?Turn on the lights when you go into a dark area. Replace any light bulbs as so

## 2022-01-23 NOTE — Progress Notes (Signed)
?Virtual Visit via Telephone Note ? ?I connected with  Kimberly Mercer on 01/23/22 at  3:00 PM EDT by telephone and verified that I am speaking with the correct person using two identifiers. ? ?Location: ?Patient: home ?Provider: [facility] ?Persons participating in the virtual visit: patient/Nurse Health Advisor ?  ?I discussed the limitations, risks, security and privacy concerns of performing an evaluation and management service by telephone and the availability of in person appointments. The patient expressed understanding and agreed to proceed. ? ?Interactive audio and video telecommunications were attempted between this nurse and patient, however failed, due to patient having technical difficulties OR patient did not have access to video capability.  We continued and completed visit with audio only. ? ?Some vital signs may be absent or patient reported.  ? ?Dionisio David, LPN ? ?Subjective:  ? Kimberly Mercer is a 76 y.o. female who presents for Medicare Annual (Subsequent) preventive examination. ? ?Review of Systems    ? ?  ? ?   ?Objective:  ?  ?There were no vitals filed for this visit. ?There is no height or weight on file to calculate BMI. ? ? ?  12/07/2020  ?  8:21 AM 11/14/2020  ?  7:52 AM 08/22/2020  ?  2:49 PM 07/14/2019  ?  2:29 PM 04/30/2018  ?  4:46 PM  ?Advanced Directives  ?Does Patient Have a Medical Advance Directive? No No No No No  ?Would patient like information on creating a medical advance directive? No - Patient declined    No - Patient declined  ? ? ?Current Medications (verified) ?Outpatient Encounter Medications as of 01/23/2022  ?Medication Sig  ? acetaminophen (TYLENOL) 650 MG CR tablet Take 650 mg by mouth every 8 (eight) hours as needed for pain.   ? albuterol (PROVENTIL) (2.5 MG/3ML) 0.083% nebulizer solution Take 3 mLs (2.5 mg total) by nebulization every 6 (six) hours as needed for wheezing or shortness of breath. Dx:J45.909  ? albuterol (VENTOLIN HFA) 108 (90 Base) MCG/ACT inhaler TAKE 2  PUFFS BY MOUTH EVERY 6 HOURS AS NEEDED FOR WHEEZE OR SHORTNESS OF BREATH  ? aspirin EC 81 MG tablet Take 81 mg by mouth every evening. Swallow whole.  ? azelastine (ASTELIN) 0.1 % nasal spray Place 1 spray into both nostrils 2 (two) times daily.  ? Calcium-Magnesium-Zinc (CAL-MAG-ZINC PO) Take 1 tablet by mouth daily.  ? CANNABIDIOL PO Take 30 mg by mouth daily. Capsule  ? cetirizine (ZYRTEC ALLERGY) 10 MG tablet Take 1 tablet (10 mg total) by mouth daily. (Patient taking differently: Take 5 mg by mouth at bedtime.)  ? cholecalciferol (VITAMIN D3) 25 MCG (1000 UNIT) tablet Take 1,000 Units by mouth daily.  ? diclofenac sodium (VOLTAREN) 1 % GEL Apply 2 g topically 4 (four) times daily as needed (pain.).  ? diphenhydrAMINE (BENADRYL) 25 MG tablet Take 25 mg by mouth every 6 (six) hours as needed (allergies.).  ? levothyroxine (SYNTHROID) 75 MCG tablet TAKE 1 TABLET BY MOUTH EVERY DAY ON AN EMPTY STOMACH  ? montelukast (SINGULAIR) 10 MG tablet TAKE 1 TABLET BY MOUTH EVERY DAY IN THE EVENING  ? Multiple Vitamin (MULTIVITAMIN WITH MINERALS) TABS tablet Take 1 tablet by mouth daily.  ? NONFORMULARY OR COMPOUNDED ITEM Estriole '1mg'$ /gm vaginal cream Apply 0.5gm vaginally daily  ? omeprazole (PRILOSEC) 40 MG capsule TAKE 1 CAPSULE (40 MG TOTAL) BY MOUTH 2 (TWO) TIMES DAILY BEFORE A MEAL.  ? pseudoephedrine (SUDAFED) 60 MG tablet Take 30 mg by mouth every morning.  ?  Spacer/Aero-Holding Chambers (AEROCHAMBER MV) inhaler Use as instructed  ? vitamin E 180 MG (400 UNITS) capsule Take 400 Units by mouth daily.   ? ?No facility-administered encounter medications on file as of 01/23/2022.  ? ? ?Allergies (verified) ?Claritin [loratadine], Fructose, Gluten meal, Ipratropium, Levaquin [levofloxacin], Sulfa drugs cross reactors, and Tilactase  ? ?History: ?Past Medical History:  ?Diagnosis Date  ? Allergic rhinitis, cause unspecified   ? Allergy   ? Asthma   ? Benign neoplasm of colon   ? Cancer [facility])   ? Candidiasis of the esophagus    ? Chicken pox   ? Diverticulitis of colon (without mention of hemorrhage)(562.11)   ? E coli enteritis   ? GERD (gastroesophageal reflux disease)   ? Hemorrhoids   ? internal  ? Measles   ? Osteoarthrosis, unspecified whether generalized or localized, unspecified site   ? Symptomatic menopausal or female climacteric states   ? Unspecified asthma(493.90)   ? Unspecified hypothyroidism   ? ?Past Surgical History:  ?Procedure Laterality Date  ? ABDOMINAL HYSTERECTOMY    ? cardiac catherization  12/09/2008  ? normal coranaries,normal EF.  ? CARDIAC CATHETERIZATION    ? dilatation and curettage    ? due to SAB x 2  ? DILATION AND CURETTAGE OF UTERUS    ? ESOPHAGOGASTRODUODENOSCOPY (EGD) WITH PROPOFOL N/A 11/14/2020  ? Procedure: ESOPHAGOGASTRODUODENOSCOPY (EGD) WITH PROPOFOL;  Surgeon: Lin Landsman, MD;  Location: [facility] ENDOSCOPY;  Service: Gastroenterology;  Laterality: N/A;  ? LEFT HEART CATH AND CORONARY ANGIOGRAPHY N/A 08/25/2020  ? Procedure: LEFT HEART CATH AND CORONARY ANGIOGRAPHY;  Surgeon: Yolonda Kida, MD;  Location: [place] [facility];  Service: Cardiovascular;  Laterality: N/A;  ? thyroid nodule  1990  ? benign  ? TOTAL ABDOMINAL HYSTERECTOMY W/ BILATERAL SALPINGOOPHORECTOMY  2003  ? complex endometrial hyperplasia, DUB, fibroids  ? WISDOM TOOTH EXTRACTION    ? ?Family History  ?Problem Relation Age of Onset  ? Heart disease Mother   ?     CAD  ? Stroke Mother   ?     x 2  ? Mental illness Mother   ?     not diagnosed  ? Irritable bowel syndrome Mother   ? Heart disease Father   ?     CAD  ? Parkinsonism Father   ? Cancer Sister   ?     Breast  ? Breast cancer Sister   ? Cancer Other   ?     Breast and Ovarian  ? Breast cancer Other   ? Heart disease Brother   ? Schizophrenia Sister   ? Lung disease Sister   ? ?Social History  ? ?Socioeconomic History  ? Marital status: Married  ?  Spouse name: Not on file  ? Number of children: 2  ? Years of education: masters  ? Highest education level:  Master's degree (e.g., MA, MS, MEng, MEd, MSW, MBA)  ?Occupational History  ? 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 Smoking status: Never  ? Smokeless tobacco: Never  ?Vaping Use  ? Vaping Use: Never used  ?Substance and Sexual Activity  ? Alcohol use: No  ? Drug use: No  ? Sexual activity: Not on file  ?Other Topics Concern  ? Not on file  ?Social History Narrative  ? Always uses seat belts. Smoke alarm in the home. No caffeine use. 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 Married x 15  years;3rd marriage. Happily married. Husband with bladder cancer and possible gastric cancer 2012-2013. Has 2 children, 2 grandchildren.  ? ?Social Determinants of Health  ? ?Financial Resource Strain: Not on file  ?Food Insecurity: Not on file  ?Transportation Needs: Not on file  ?Physical Activity: Not on file  ?Stress: Not on file  ?Social Connections: Not on file  ? ? ?Tobacco Counseling ?Counseling given: Not Answered ? ? ?Clinical Intake: ? ?  ? ?Pain : No/denies pain ? ?  ? ?Nutritional Risks: None ?Diabetes: No ? ?How often do you need to have someone help you when you read instructions, pamphlets, or other written materials from your doctor or pharmacy?: 1 - Never ? ?Diabetic?no ? ?Interpreter Needed?: No ? ?Information entered by :: Kirke Shaggy, LPN ? ? ?Activities of Daily Living ? ?  02/07/2021  ? 11:04 AM  ?In your present state of health, do you have any difficulty performing the following activities:  ?Hearing? 0  ?Vision? 0  ?Difficulty concentrating or making decisions? 1  ?Walking or climbing stairs? 0  ?Dressing or bathing? 0  ?Doing errands, shopping? 0  ? ? ?Patient Care Team: ?Birdie Sons, MD as PCP - General (Family Medicine) ?Yolonda Kida, MD as Consulting Physician (Cardiology) ?Lin Landsman, MD as Consulting Physician (Gastroenterology) ?Anell Barr, OD (Optometry) ?Tyler Pita, MD as Consulting  Physician (Pulmonary Disease) ?Benjaman Kindler, MD as Consulting Physician (Obstetrics and Gynecology) ? ?Indicate any recent Medical Services you may have received from other than [facility] providers in the past yea

## 2022-01-28 ENCOUNTER — Other Ambulatory Visit: Payer: Self-pay | Admitting: Family Medicine

## 2022-01-28 DIAGNOSIS — E079 Disorder of thyroid, unspecified: Secondary | ICD-10-CM

## 2022-02-19 DIAGNOSIS — Z20822 Contact with and (suspected) exposure to covid-19: Secondary | ICD-10-CM | POA: Diagnosis not present

## 2022-02-21 DIAGNOSIS — Z20822 Contact with and (suspected) exposure to covid-19: Secondary | ICD-10-CM | POA: Diagnosis not present

## 2022-02-22 DIAGNOSIS — H40021 Open angle with borderline findings, high risk, right eye: Secondary | ICD-10-CM | POA: Diagnosis not present

## 2022-02-22 DIAGNOSIS — H401222 Low-tension glaucoma, left eye, moderate stage: Secondary | ICD-10-CM | POA: Diagnosis not present

## 2022-02-28 DIAGNOSIS — H538 Other visual disturbances: Secondary | ICD-10-CM | POA: Diagnosis not present

## 2022-03-28 DIAGNOSIS — E039 Hypothyroidism, unspecified: Secondary | ICD-10-CM | POA: Diagnosis not present

## 2022-03-28 DIAGNOSIS — Z8249 Family history of ischemic heart disease and other diseases of the circulatory system: Secondary | ICD-10-CM | POA: Diagnosis not present

## 2022-03-28 DIAGNOSIS — J45909 Unspecified asthma, uncomplicated: Secondary | ICD-10-CM | POA: Diagnosis not present

## 2022-03-28 DIAGNOSIS — Z79890 Hormone replacement therapy (postmenopausal): Secondary | ICD-10-CM | POA: Diagnosis not present

## 2022-03-28 DIAGNOSIS — H2511 Age-related nuclear cataract, right eye: Secondary | ICD-10-CM | POA: Diagnosis not present

## 2022-03-28 DIAGNOSIS — Z823 Family history of stroke: Secondary | ICD-10-CM | POA: Diagnosis not present

## 2022-03-28 DIAGNOSIS — M180 Bilateral primary osteoarthritis of first carpometacarpal joints: Secondary | ICD-10-CM | POA: Diagnosis not present

## 2022-03-28 DIAGNOSIS — M19041 Primary osteoarthritis, right hand: Secondary | ICD-10-CM | POA: Diagnosis not present

## 2022-03-28 DIAGNOSIS — K219 Gastro-esophageal reflux disease without esophagitis: Secondary | ICD-10-CM | POA: Diagnosis not present

## 2022-03-28 DIAGNOSIS — Z79899 Other long term (current) drug therapy: Secondary | ICD-10-CM | POA: Diagnosis not present

## 2022-03-28 DIAGNOSIS — Z8261 Family history of arthritis: Secondary | ICD-10-CM | POA: Diagnosis not present

## 2022-03-28 DIAGNOSIS — M19042 Primary osteoarthritis, left hand: Secondary | ICD-10-CM | POA: Diagnosis not present

## 2022-04-04 DIAGNOSIS — R000 Tachycardia, unspecified: Secondary | ICD-10-CM | POA: Diagnosis not present

## 2022-04-04 DIAGNOSIS — K219 Gastro-esophageal reflux disease without esophagitis: Secondary | ICD-10-CM | POA: Diagnosis not present

## 2022-04-04 DIAGNOSIS — R251 Tremor, unspecified: Secondary | ICD-10-CM | POA: Diagnosis not present

## 2022-04-04 DIAGNOSIS — R002 Palpitations: Secondary | ICD-10-CM | POA: Diagnosis not present

## 2022-04-04 DIAGNOSIS — J4520 Mild intermittent asthma, uncomplicated: Secondary | ICD-10-CM | POA: Diagnosis not present

## 2022-04-04 DIAGNOSIS — R0602 Shortness of breath: Secondary | ICD-10-CM | POA: Diagnosis not present

## 2022-06-25 ENCOUNTER — Encounter: Payer: Self-pay | Admitting: Gastroenterology

## 2022-06-25 MED ORDER — FLUCONAZOLE 200 MG PO TABS: 200.0000 mg | ORAL_TABLET | Freq: Two times a day (BID) | ORAL | 0 refills | Status: AC

## 2022-06-28 DIAGNOSIS — H401222 Low-tension glaucoma, left eye, moderate stage: Secondary | ICD-10-CM | POA: Diagnosis not present

## 2022-07-01 ENCOUNTER — Telehealth: Payer: Self-pay | Admitting: Family Medicine

## 2022-07-01 DIAGNOSIS — N952 Postmenopausal atrophic vaginitis: Secondary | ICD-10-CM

## 2022-07-01 NOTE — Telephone Encounter (Signed)
Patient has appointment tomorrow 07/02/2022.   Medication is not listed on active medication  list.

## 2022-07-01 NOTE — Telephone Encounter (Signed)
[facility] faxed refill request for the following medications:  estradiol (ESTRACE) 0.1 MG/GM vaginal cream   Please advise.

## 2022-07-02 ENCOUNTER — Encounter: Payer: Self-pay | Admitting: Family Medicine

## 2022-07-02 ENCOUNTER — Other Ambulatory Visit: Payer: Self-pay

## 2022-07-02 ENCOUNTER — Ambulatory Visit (INDEPENDENT_AMBULATORY_CARE_PROVIDER_SITE_OTHER): Payer: Medicare Other | Admitting: Gastroenterology

## 2022-07-02 ENCOUNTER — Ambulatory Visit (INDEPENDENT_AMBULATORY_CARE_PROVIDER_SITE_OTHER): Payer: Medicare Other | Admitting: Family Medicine

## 2022-07-02 ENCOUNTER — Encounter: Payer: Self-pay | Admitting: Gastroenterology

## 2022-07-02 VITALS — BP 102/61 | HR 62 | Temp 98.0°F | Ht 67.0 in | Wt 135.5 lb

## 2022-07-02 VITALS — BP 108/63 | HR 74 | Resp 16 | Ht 67.0 in | Wt 135.0 lb

## 2022-07-02 DIAGNOSIS — K219 Gastro-esophageal reflux disease without esophagitis: Secondary | ICD-10-CM | POA: Diagnosis not present

## 2022-07-02 DIAGNOSIS — R194 Change in bowel habit: Secondary | ICD-10-CM | POA: Diagnosis not present

## 2022-07-02 DIAGNOSIS — J45909 Unspecified asthma, uncomplicated: Secondary | ICD-10-CM | POA: Diagnosis not present

## 2022-07-02 DIAGNOSIS — E079 Disorder of thyroid, unspecified: Secondary | ICD-10-CM

## 2022-07-02 DIAGNOSIS — K909 Intestinal malabsorption, unspecified: Secondary | ICD-10-CM | POA: Diagnosis not present

## 2022-07-02 DIAGNOSIS — R4182 Altered mental status, unspecified: Secondary | ICD-10-CM | POA: Diagnosis not present

## 2022-07-02 DIAGNOSIS — R1310 Dysphagia, unspecified: Secondary | ICD-10-CM | POA: Diagnosis not present

## 2022-07-02 MED ORDER — SPIRIVA RESPIMAT 1.25 MCG/ACT IN AERS: 2.0000 | INHALATION_SPRAY | Freq: Every day | RESPIRATORY_TRACT | 0 refills | Status: DC

## 2022-07-02 MED ORDER — ESTRADIOL 0.1 MG/GM VA CREA: 1.0000 | TOPICAL_CREAM | VAGINAL | 12 refills | Status: DC

## 2022-07-02 NOTE — Progress Notes (Signed)
I,Tiffany J Bragg,acting as a scribe for Lelon Huh, MD.,have documented all relevant documentation on the behalf of Lelon Huh, MD,as directed by  Lelon Huh, MD while in the presence of Lelon Huh, MD.  Established patient visit   Patient: Kimberly Mercer   DOB: 1946/04/01   76 y.o. Female  MRN: [id number] Visit Date: 07/02/2022  Today's healthcare provider: Lelon Huh, MD   Chief Complaint  Patient presents with   Hypothyroidism   Subjective    HPI  Hypothyroid, follow-up  Lab Results  Component Value Date   TSH 1.450 07/09/2021   TSH 1.130 03/21/2020   TSH 1.050 07/22/2019   FREET4 1.25 07/09/2021   T4TOTAL 7.1 03/21/2020    Wt Readings from Last 3 Encounters:  07/02/22 135 lb (61.2 kg)  01/23/22 134 lb (60.8 kg)  11/12/21 134 lb 8 oz (61 kg)    She was last seen for hypothyroid 1 years ago.  Management since that visit includes continue medication. She reports excellent compliance with treatment. She is not having side effects.   Symptoms: Yes change in energy level Yes constipation  Yes diarrhea Yes heat / cold intolerance  Yes nervousness No palpitations  Yes weight changes    -----------------------------------------------------------------------------------------  She also reports episode of diarrhea and upset stomach she and her husband both experienced a while back. Her husband's symptoms resolved, but hers continue to wax and wane. At one point she developed difficulty swallowing similar to previous episodes of esophageal candidiasis and started on oral fluconazole prescribed by Dr. Marius Ditch on 06-25-22. Swallowing difficulties rapidly improved, but a few days into course of fluconazole she started experiencing profound brain fog, which has slowing improved but not complete resolved.   She has also been having much more trouble with asthma this summer which she feels is related to heat and smoke. Albuterol nebulizer helps for awhile, but it  doesn't seem to be working as well as it used. She continues on montelukast every day which is definitely helping, but not controlling her symptoms. She cannot use steroid inhalers due to predilection for oral and esophageal yeast infections. She was tried on several inhalers by her pulmonologist but was intolerant to all the maintenance inhalers. She does have ipratropium nebules that she uses occasionally, which are effective but make her shaky.   Medications: Outpatient Medications Prior to Visit  Medication Sig   acetaminophen (TYLENOL) 325 MG tablet Take by mouth.   albuterol (PROVENTIL) (2.5 MG/3ML) 0.083% nebulizer solution Take 3 mLs (2.5 mg total) by nebulization every 6 (six) hours as needed for wheezing or shortness of breath. Dx:J45.909   albuterol (VENTOLIN HFA) 108 (90 Base) MCG/ACT inhaler TAKE 2 PUFFS BY MOUTH EVERY 6 HOURS AS NEEDED FOR WHEEZE OR SHORTNESS OF BREATH   aspirin EC 81 MG tablet Take 81 mg by mouth every evening. Swallow whole.   Calcium-Magnesium-Zinc (CAL-MAG-ZINC PO) Take 1 tablet by mouth daily.   CANNABIDIOL PO Take 30 mg by mouth daily. Capsule   cholecalciferol (VITAMIN D3) 25 MCG (1000 UNIT) tablet Take 1,000 Units by mouth daily.   diphenhydrAMINE (BENADRYL) 25 MG tablet Take 25 mg by mouth every 6 (six) hours as needed (allergies.).   famotidine (PEPCID) 20 MG tablet Take by mouth.   fluconazole (DIFLUCAN) 200 MG tablet Take 1 tablet (200 mg total) by mouth 2 (two) times daily for 14 days.   latanoprost (XALATAN) 0.005 % ophthalmic solution 1 drop at bedtime.   levothyroxine (SYNTHROID) 75 MCG  tablet TAKE 1 TABLET BY MOUTH EVERY DAY ON AN EMPTY STOMACH   montelukast (SINGULAIR) 10 MG tablet Take 1 tablet by mouth every evening.   Multiple Vitamin (MULTIVITAMIN WITH MINERALS) TABS tablet Take 1 tablet by mouth daily.   NONFORMULARY OR COMPOUNDED ITEM Estriole '1mg'$ /gm vaginal cream Apply 0.5gm vaginally daily   omeprazole (PRILOSEC) 40 MG capsule Take by  mouth.   pseudoephedrine (SUDAFED) 60 MG tablet Take 30 mg by mouth every morning.   Spacer/Aero-Holding Chambers (AEROCHAMBER MV) inhaler Use as instructed   vitamin E 180 MG (400 UNITS) capsule Take 400 Units by mouth daily.    azelastine (ASTELIN) 0.1 % nasal spray Place 1 spray into both nostrils 2 (two) times daily. (Patient not taking: Reported on 07/02/2022)   brimonidine (ALPHAGAN) 0.2 % ophthalmic solution Place 1 drop into the left eye 2 (two) times daily. (Patient not taking: Reported on 07/02/2022)   cetirizine (ZYRTEC ALLERGY) 10 MG tablet Take 1 tablet (10 mg total) by mouth daily. (Patient not taking: Reported on 07/02/2022)   diclofenac sodium (VOLTAREN) 1 % GEL Apply 2 g topically 4 (four) times daily as needed (pain.). (Patient not taking: Reported on 07/02/2022)   No facility-administered medications prior to visit.         Objective    BP 108/63 (BP Location: Left Arm, Patient Position: Sitting, Cuff Size: Normal)   Pulse 74   Resp 16   Ht '5\' 7"'$  (1.702 m)   Wt 135 lb (61.2 kg)   SpO2 98%   BMI 21.14 kg/m    Physical Exam    General: Appearance:    Well developed, well nourished female in no acute distress  Eyes:    PERRL, conjunctiva/corneas clear, EOM's intact       Lungs:     Clear to auscultation bilaterally, respirations unlabored  Heart:    Normal heart rate. Normal rhythm. No murmurs, rubs, or gallops.    MS:   All extremities are intact.    Neurologic:   Awake, alert, oriented x 3. No apparent focal neurological defect.         Assessment & Plan     1. Altered mental status, unspecified altered mental status type Onset shortly into recent course of fluconazole. She has never this type of reaction to fluconazole, however she has also had quite a bit of digestive issues recently that may be affecting nutritional and fluid intake  - Comprehensive metabolic panel - VITAMIN D 25 Hydroxy (Vit-D Deficiency, Fractures) - CBC with Differential/Platelet  2.  Gastroesophageal reflux disease, unspecified whether esophagitis present Well controlled on long term pantoprazole. Need to rule out any nutritional deficiency given her other symptoms  - VITAMIN D 25 Hydroxy (Vit-D Deficiency, Fractures) - Magnesium  3. Persistent asthma without complication, unspecified asthma severity Much worse due to weather and outside smoke exposure this summer. She cannot take steroid inhaler or long acting beta agonist inhalers. Try low dose Tiotropium Bromide Monohydrate (SPIRIVA RESPIMAT) 1.25 MCG/ACT AERS; advised to start with 1 puff and day, and if increase to 2 puffs after a week, if tolerating   4. Thyroid disease  - TSH - T4, free  5. Bowel habit changes  - CBC with Differential/Platelet  6. Intestinal malabsorption, unspecified type  - VITAMIN D 25 Hydroxy (Vit-D Deficiency, Fractures)      The entirety of the information documented in the History of Present Illness, Review of Systems and Physical Exam were personally obtained by me. Portions of this information  were initially documented by the CMA and reviewed by me for thoroughness and accuracy.     Lelon Huh, MD  [facility] [phone] (phone) [phone] (fax)  [place]

## 2022-07-02 NOTE — Patient Instructions (Signed)
Please review the attached list of medications and notify my office if there are any errors.   Start Spiriva by taking 1 puff once a day for a week. If you're not having any side effects after a week, then increase to 2 puffs once a day

## 2022-07-02 NOTE — Addendum Note (Signed)
Addended by: Birdie Sons on: 07/02/2022 12:34 PM   Modules accepted: Orders
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
## 2022-07-03 ENCOUNTER — Encounter: Payer: Self-pay | Admitting: Family Medicine

## 2022-07-03 ENCOUNTER — Encounter: Payer: Self-pay | Admitting: Gastroenterology

## 2022-07-03 DIAGNOSIS — E079 Disorder of thyroid, unspecified: Secondary | ICD-10-CM

## 2022-07-03 LAB — COMPREHENSIVE METABOLIC PANEL
ALT: 20 IU/L (ref 0–32)
AST: 27 IU/L (ref 0–40)
Albumin/Globulin Ratio: 2.3 — ABNORMAL HIGH (ref 1.2–2.2)
Albumin: 4.6 g/dL (ref 3.8–4.8)
Alkaline Phosphatase: 65 IU/L (ref 44–121)
BUN/Creatinine Ratio: 31 — ABNORMAL HIGH (ref 12–28)
BUN: 24 mg/dL (ref 8–27)
Bilirubin Total: 0.3 mg/dL (ref 0.0–1.2)
CO2: 24 mmol/L (ref 20–29)
Calcium: 9.1 mg/dL (ref 8.7–10.3)
Chloride: 103 mmol/L (ref 96–106)
Creatinine, Ser: 0.78 mg/dL (ref 0.57–1.00)
Globulin, Total: 2 g/dL (ref 1.5–4.5)
Glucose: 115 mg/dL — ABNORMAL HIGH (ref 70–99)
Potassium: 4.5 mmol/L (ref 3.5–5.2)
Sodium: 140 mmol/L (ref 134–144)
Total Protein: 6.6 g/dL (ref 6.0–8.5)
eGFR: 80 mL/min/{1.73_m2} (ref >59)

## 2022-07-03 LAB — T4, FREE: Free T4: 1.55 ng/dL (ref 0.82–1.77)

## 2022-07-03 LAB — CBC WITH DIFFERENTIAL/PLATELET
Basophils Absolute: 0 10*3/uL (ref 0.0–0.2)
Basos: 1 %
EOS (ABSOLUTE): 0 10*3/uL (ref 0.0–0.4)
Eos: 1 %
Hematocrit: 43.9 % (ref 34.0–46.6)
Hemoglobin: 14.9 g/dL (ref 11.1–15.9)
Immature Grans (Abs): 0 10*3/uL (ref 0.0–0.1)
Immature Granulocytes: 0 %
Lymphocytes Absolute: 1.2 10*3/uL (ref 0.7–3.1)
Lymphs: 33 %
MCH: 32.2 pg (ref 26.6–33.0)
MCHC: 33.9 g/dL (ref 31.5–35.7)
MCV: 95 fL (ref 79–97)
Monocytes Absolute: 0.4 10*3/uL (ref 0.1–0.9)
Monocytes: 11 %
Neutrophils Absolute: 2 10*3/uL (ref 1.4–7.0)
Neutrophils: 54 %
Platelets: 297 10*3/uL (ref 150–450)
RBC: 4.63 x10E6/uL (ref 3.77–5.28)
RDW: 11.9 % (ref 11.7–15.4)
WBC: 3.6 10*3/uL (ref 3.4–10.8)

## 2022-07-03 LAB — VITAMIN D 25 HYDROXY (VIT D DEFICIENCY, FRACTURES): Vit D, 25-Hydroxy: 41.8 ng/mL (ref 30.0–100.0)

## 2022-07-03 LAB — MAGNESIUM: Magnesium: 2.1 mg/dL (ref 1.6–2.3)

## 2022-07-03 LAB — TSH: TSH: 0.371 u[IU]/mL — ABNORMAL LOW (ref 0.450–4.500)

## 2022-07-03 MED ORDER — LEVOTHYROXINE SODIUM 50 MCG PO TABS: 50.0000 ug | ORAL_TABLET | Freq: Every day | ORAL | 1 refills | Status: DC

## 2022-07-05 ENCOUNTER — Other Ambulatory Visit: Payer: Self-pay | Admitting: Family Medicine

## 2022-07-05 DIAGNOSIS — N952 Postmenopausal atrophic vaginitis: Secondary | ICD-10-CM

## 2022-07-05 NOTE — Telephone Encounter (Signed)
Medication Refill - Medication: NONFORMULARY OR COMPOUNDED ITEM  Has the patient contacted their pharmacy? Yes.    (Agent: If yes, when and what did the pharmacy advise?) Wrong rx sent   Preferred Pharmacy (with phone number or street name):  [facility], [place] Phone:  [phone]  Fax:  [phone]     Has the patient been seen for an appointment in the last year OR does the patient have an upcoming appointment? Yes.    Agent: Please be advised that RX refills may take up to 3 business days. We ask that you follow-up with your pharmacy.

## 2022-07-05 NOTE — Telephone Encounter (Signed)
Kinsey from the pharmacy called to request refills of this, please advise

## 2022-07-08 NOTE — Telephone Encounter (Signed)
Requested medication (s) are due for refill today: yes  Requested medication (s) are on the active medication list: yes  Last refill:  06/22/2021  Future visit scheduled: no, just seen 07/02/22  Notes to clinic:  There is not protocol to follow, please assess.      Requested Prescriptions  Pending Prescriptions Disp Refills   NONFORMULARY OR COMPOUNDED ITEM 30 each 12    Sig: Estriole '1mg'$ /gm vaginal cream Apply 0.5gm vaginally daily     There is no refill protocol information for this order

## 2022-07-11 ENCOUNTER — Encounter: Payer: Self-pay | Admitting: Gastroenterology

## 2022-07-19 ENCOUNTER — Other Ambulatory Visit: Payer: Self-pay | Admitting: Family Medicine

## 2022-07-19 DIAGNOSIS — J45909 Unspecified asthma, uncomplicated: Secondary | ICD-10-CM

## 2022-07-19 MED ORDER — SPIRIVA RESPIMAT 1.25 MCG/ACT IN AERS: 2.0000 | INHALATION_SPRAY | Freq: Every day | RESPIRATORY_TRACT | 4 refills | Status: DC

## 2022-08-09 ENCOUNTER — Encounter: Payer: Self-pay | Admitting: Gastroenterology

## 2022-08-09 DIAGNOSIS — R197 Diarrhea, unspecified: Secondary | ICD-10-CM

## 2022-08-20 DIAGNOSIS — Z23 Encounter for immunization: Secondary | ICD-10-CM | POA: Diagnosis not present

## 2022-08-21 DIAGNOSIS — R197 Diarrhea, unspecified: Secondary | ICD-10-CM | POA: Diagnosis not present

## 2022-08-24 LAB — GI PROFILE, STOOL, PCR

## 2022-09-27 ENCOUNTER — Other Ambulatory Visit: Payer: Self-pay | Admitting: Gastroenterology

## 2022-09-27 ENCOUNTER — Other Ambulatory Visit: Payer: Self-pay | Admitting: Family Medicine

## 2022-09-27 NOTE — Telephone Encounter (Signed)
Requested medications are due for refill today.  Unsure  Requested medications are on the active medications list.  yes  Last refill. 07/02/2022  Future visit scheduled.   no  Notes to clinic.  Medication is historical.    Requested Prescriptions  Pending Prescriptions Disp Refills   montelukast (SINGULAIR) 10 MG tablet [Pharmacy Med Name: MONTELUKAST SOD 10 MG TABLET] 90 tablet 2    Sig: TAKE 1 TABLET BY MOUTH EVERY DAY IN THE EVENING     Pulmonology:  Leukotriene Inhibitors Passed - 09/27/2022  1:55 AM      Passed - Valid encounter within last 12 months    Recent Outpatient Visits           2 months ago Altered mental status, unspecified altered mental status type   [facility] Birdie Sons, MD   1 year ago Thyroid disease   [facility] Birdie Sons, MD   1 year ago Abnormal mammogram of left breast   [facility] Birdie Sons, MD   1 year ago Atherosclerosis of abdominal aorta [facility])   [place], [place], [place]   2 years ago Non-seasonal allergic rhinitis due to pollen   [facility], [name], [place]

## 2022-10-15 ENCOUNTER — Other Ambulatory Visit: Payer: Self-pay | Admitting: Family Medicine

## 2022-10-15 DIAGNOSIS — N952 Postmenopausal atrophic vaginitis: Secondary | ICD-10-CM

## 2022-10-15 DIAGNOSIS — E079 Disorder of thyroid, unspecified: Secondary | ICD-10-CM

## 2022-10-17 ENCOUNTER — Other Ambulatory Visit: Payer: Self-pay | Admitting: Family Medicine

## 2022-10-17 DIAGNOSIS — N952 Postmenopausal atrophic vaginitis: Secondary | ICD-10-CM

## 2022-10-21 ENCOUNTER — Encounter: Payer: Self-pay | Admitting: Gastroenterology

## 2022-10-21 DIAGNOSIS — R197 Diarrhea, unspecified: Secondary | ICD-10-CM

## 2022-10-22 NOTE — Telephone Encounter (Signed)
Order the lab test and ultrasound. Got patient schedule for ultrasound for 10/24/2021 at 9:30. Need to arrive at 9:00am and nothing to eat or drink after midnight. Called and patient verbalized understanding of instructions will come for labs tomorrow

## 2022-10-23 DIAGNOSIS — R197 Diarrhea, unspecified: Secondary | ICD-10-CM | POA: Diagnosis not present

## 2022-10-24 ENCOUNTER — Ambulatory Visit
Admission: RE | Admit: 2022-10-24 | Discharge: 2022-10-24 | Disposition: A | Discharge status: Home / Self Care | Payer: Medicare Other | Source: Ambulatory Visit | Attending: Gastroenterology | Admitting: Gastroenterology

## 2022-10-24 DIAGNOSIS — R197 Diarrhea, unspecified: Secondary | ICD-10-CM | POA: Diagnosis not present

## 2022-10-25 DIAGNOSIS — H25812 Combined forms of age-related cataract, left eye: Secondary | ICD-10-CM | POA: Diagnosis not present

## 2022-10-25 DIAGNOSIS — H401222 Low-tension glaucoma, left eye, moderate stage: Secondary | ICD-10-CM | POA: Diagnosis not present

## 2022-10-26 LAB — FOOD ALLERGY PROFILE
Allergen Corn, IgE: <0.1 kU/L
Clam IgE: <0.1 kU/L
Codfish IgE: <0.1 kU/L
Egg White IgE: <0.1 kU/L
Milk IgE: <0.1 kU/L
Peanut IgE: <0.1 kU/L
Scallop IgE: <0.1 kU/L
Sesame Seed IgE: <0.1 kU/L
Shrimp IgE: <0.1 kU/L
Soybean IgE: <0.1 kU/L
Walnut IgE: <0.1 kU/L
Wheat IgE: <0.1 kU/L

## 2022-10-26 LAB — ALPHA-GAL PANEL
Allergen Lamb IgE: <0.1 kU/L
Beef IgE: <0.1 kU/L
IgE (Immunoglobulin E), Serum: 13 IU/mL (ref 6–495)
O215-IgE Alpha-Gal: <0.1 kU/L
Pork IgE: <0.1 kU/L

## 2022-10-29 ENCOUNTER — Telehealth (INDEPENDENT_AMBULATORY_CARE_PROVIDER_SITE_OTHER): Payer: Medicare Other | Admitting: Gastroenterology

## 2022-10-29 ENCOUNTER — Encounter: Payer: Self-pay | Admitting: Gastroenterology

## 2022-10-29 DIAGNOSIS — K909 Intestinal malabsorption, unspecified: Secondary | ICD-10-CM

## 2022-10-29 DIAGNOSIS — R634 Abnormal weight loss: Secondary | ICD-10-CM | POA: Diagnosis not present

## 2022-10-29 NOTE — Progress Notes (Signed)
[name], MD [address]  [place]  [place], [place] [address]  Main: [phone]  Fax: [phone]    Gastroenterology Consultation Video Visit  Referring Provider:     Birdie Sons, MD Primary Care Physician:  Birdie Sons, MD Primary Gastroenterologist:  Dr. [name] Reason for Consultation: Weight loss and        HPI:   Kimberly Mercer is a 77 y.o. female referred by Dr. Birdie Sons, MD  for consultation & management of weight loss and fatty stools.  Virtual Visit Video Note  I connected with Kimberly Mercer on 10/29/22 at 11:30 AM EST by video and verified that I am speaking with the correct person using two identifiers.   I discussed the limitations, risks, security and privacy concerns of performing an evaluation and management service by video and the availability of in person appointments. I also discussed with the patient that there may be a patient responsible charge related to this service. The patient expressed understanding and agreed to proceed.  Location of the Patient: Home  Location of the provider: Home office  Persons participating in the visit: Patient and provider only   History of Present Illness: Kimberly Mercer is a 77 year old pleasant female with history of esophageal candidiasis, history of E. coli in the past.  I saw her last in September 2023 for atypical chest pain, she has history of GERD.  She was empirically treated for esophageal candidiasis and her symptoms resolved.  Patient has eliminated refined carbs due to recurrent esophageal candidiasis since then.  Her weight was down from 135 pounds to 127 pounds until before Christmas.  She reports that she and her husband both had acute diarrheal illness that lasted for 1 day this was right after Christmas.  Since then, her stools have been of variable consistency, loose.  She weighs between 125 and 126 pounds, has been stable for last 2 weeks.  She is concerned  about greasy stools whenever she tried to eat animal fat and that is when she reached out to me via MyChart.  She had a chicken patty last night for dinner and she tolerated it well.  She is able to tolerate plant-based foods.  She has severe lactose intolerance as well.  She does report feeling bloated when she tries to have any dairy products including yogurt.  She has not tried any probiotics.  This morning, she had 2 soft bowel movements.  She denies any abdominal pain.  She has noticed done achy itching in her back between right shoulder blade and back whenever she has any fatty foods.  Right upper quadrant ultrasound was unremarkable, food allergy profile and alpha gal panel were negative    Past Medical History:  Diagnosis Date   Allergic rhinitis, cause unspecified    Allergy    Asthma    Benign neoplasm of colon    Cancer ([place])    Candidiasis of the esophagus    Chicken pox    Diverticulitis of colon (without mention of hemorrhage)(562.11)    E coli enteritis    GERD (gastroesophageal reflux disease)    Hemorrhoids    internal   Measles    Osteoarthrosis, unspecified whether generalized or localized, unspecified site    Symptomatic menopausal or female climacteric states    Unspecified asthma(493.90)    Unspecified hypothyroidism     Past Surgical History:  Procedure Laterality Date   ABDOMINAL HYSTERECTOMY  cardiac catherization  12/09/2008   normal coranaries,normal EF.   CARDIAC CATHETERIZATION     dilatation and curettage     due to SAB x 2   DILATION AND CURETTAGE OF UTERUS     ESOPHAGOGASTRODUODENOSCOPY (EGD) WITH PROPOFOL N/A 11/14/2020   Procedure: ESOPHAGOGASTRODUODENOSCOPY (EGD) WITH PROPOFOL;  Surgeon: Lin Landsman, MD;  Location: [facility];  Service: Gastroenterology;  Laterality: N/A;   LEFT HEART CATH AND CORONARY ANGIOGRAPHY N/A 08/25/2020   Procedure: LEFT HEART CATH AND CORONARY ANGIOGRAPHY;  Surgeon: Yolonda Kida, MD;  Location: [facility];  Service: Cardiovascular;  Laterality: N/A;   thyroid nodule  1990   benign   TOTAL ABDOMINAL HYSTERECTOMY W/ BILATERAL SALPINGOOPHORECTOMY  2003   complex endometrial hyperplasia, DUB, fibroids   WISDOM TOOTH EXTRACTION       Current Outpatient Medications:    acetaminophen (TYLENOL) 325 MG tablet, Take by mouth., Disp: , Rfl:    albuterol (PROVENTIL) (2.5 MG/3ML) 0.083% nebulizer solution, Take 3 mLs (2.5 mg total) by nebulization every 6 (six) hours as needed for wheezing or shortness of breath. Dx:J45.909, Disp: 75 mL, Rfl: 12   albuterol (VENTOLIN HFA) 108 (90 Base) MCG/ACT inhaler, TAKE 2 PUFFS BY MOUTH EVERY 6 HOURS AS NEEDED FOR WHEEZE OR SHORTNESS OF BREATH, Disp: 18 each, Rfl: 3   aspirin EC 81 MG tablet, Take 81 mg by mouth every evening. Swallow whole., Disp: , Rfl:    Calcium-Magnesium-Zinc (CAL-MAG-ZINC PO), Take 1 tablet by mouth daily., Disp: , Rfl:    CANNABIDIOL PO, Take 30 mg by mouth daily. Capsule, Disp: , Rfl:    cetirizine (ZYRTEC ALLERGY) 10 MG tablet, Take 1 tablet (10 mg total) by mouth daily. (Patient not taking: Reported on 07/02/2022), Disp: 30 tablet, Rfl: 6   cholecalciferol (VITAMIN D3) 25 MCG (1000 UNIT) tablet, Take 1,000 Units by mouth daily., Disp: , Rfl:    diclofenac sodium (VOLTAREN) 1 % GEL, Apply 2 g topically 4 (four) times daily as needed (pain.)., Disp: , Rfl:    diphenhydrAMINE (BENADRYL) 25 MG tablet, Take 25 mg by mouth every 6 (six) hours as needed (allergies.)., Disp: , Rfl:    estradiol (ESTRACE) 0.1 MG/GM vaginal cream, PLACE 1 APPLICATORFUL VAGINALLY 3 (THREE) TIMES A WEEK., Disp: 42.5 g, Rfl: 12   famotidine (PEPCID) 20 MG tablet, Take by mouth., Disp: , Rfl:    latanoprost (XALATAN) 0.005 % ophthalmic solution, 1 drop at bedtime., Disp: , Rfl:    levothyroxine (SYNTHROID) 50 MCG tablet, Take 1 tablet (50 mcg total) by mouth daily before breakfast., Disp: 90 tablet, Rfl: 1   montelukast (SINGULAIR) 10 MG tablet, TAKE 1  TABLET BY MOUTH EVERY DAY IN THE EVENING, Disp: 90 tablet, Rfl: 4   Multiple Vitamin (MULTIVITAMIN WITH MINERALS) TABS tablet, Take 1 tablet by mouth daily., Disp: , Rfl:    NONFORMULARY OR COMPOUNDED ITEM, Estriole '1mg'$ /gm vaginal cream Apply 0.5gm vaginally daily, Disp: 30 each, Rfl: 12   omeprazole (PRILOSEC) 40 MG capsule, TAKE 1 CAPSULE (40 MG TOTAL) BY MOUTH 2 (TWO) TIMES DAILY BEFORE A MEAL., Disp: 180 capsule, Rfl: 1   pseudoephedrine (SUDAFED) 60 MG tablet, Take 30 mg by mouth every morning., Disp: , Rfl:    Spacer/Aero-Holding Chambers (AEROCHAMBER MV) inhaler, Use as instructed, Disp: 1 each, Rfl: 0   Tiotropium Bromide Monohydrate (SPIRIVA RESPIMAT) 1.25 MCG/ACT AERS, Inhale 2 puffs into the lungs daily., Disp: 12 g, Rfl: 4   vitamin E 180 MG (400 UNITS) capsule, Take  400 Units by mouth daily. , Disp: , Rfl:    Family History  Problem Relation Age of Onset   Heart disease Mother        CAD   Stroke Mother        x 2   Mental illness Mother        not diagnosed   Irritable bowel syndrome Mother    Heart disease Father        CAD   Parkinsonism Father    Cancer Sister        Breast   Breast cancer Sister    Cancer Other        Breast and Ovarian   Breast cancer Other    Heart disease Brother    Schizophrenia Sister    Lung disease Sister      Social History   Tobacco Use   Smoking status: Never   Smokeless tobacco: Never  Vaping Use   Vaping Use: Never used  Substance Use Topics   Alcohol use: No   Drug use: No    Allergies as of 10/29/2022 - Review Complete 07/02/2022  Allergen Reaction Noted   Claritin [loratadine] Other (See Comments) 07/04/2012   Fructose Diarrhea 03/12/2016   Gluten meal Other (See Comments) 09/23/2012   Ipratropium Nausea And Vomiting 08/20/2020   Lactose Other (See Comments) 03/28/2022   Levaquin [levofloxacin] Nausea Only 07/04/2012   Sulfa drugs cross reactors Hives 07/04/2012   Tilactase Other (See Comments) 02/23/2014      Imaging Studies: Reviewed  Assessment and Plan:   Kimberly Mercer is a 77 y.o. pleasant Caucasian female with history of chronic GERD, esophageal candidiasis, E. coli enteritis, hypothyroidism is seen in consultation for new onset of weight loss and change in bowel habits, fatty stools.  Acute diarrheal illness that lasted for 1 day about 2 weeks ago.  Patient lost about 5 to 7 pounds since cutting back refined carbs within last 3 months.  Currently, weight loss has stabilized.  Noticing greasy/fatty stools with consumption of animal fat only.  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 The patient was provided an opportunity to ask questions and all were answered. The patient agreed with the plan and demonstrated an understanding of the instructions.   The patient was advised to call back or seek an in-person evaluation if the symptoms worsen or if the condition fails to improve as anticipated.  I provided 39 minutes of face-to-face time during this encounter.   Follow up as needed   [name], MD

## 2022-11-28 ENCOUNTER — Telehealth: Payer: Self-pay

## 2022-11-28 DIAGNOSIS — R928 Other abnormal and inconclusive findings on diagnostic imaging of breast: Secondary | ICD-10-CM

## 2022-11-28 NOTE — Telephone Encounter (Signed)
Copied from [place] [id number]. Topic: General - Other >> Nov 28, 2022  3:14 PM Chapman Fitch wrote: Reason for CRM: pt needs orders for Bilateral mammogram and a diagnostic order for the left breast / please advise

## 2022-11-29 NOTE — Addendum Note (Signed)
Addended by: Lelon Huh E on: 11/29/2022 06:00 PM   Modules accepted: Orders

## 2022-11-29 NOTE — Addendum Note (Signed)
Addended by: Birdie Sons on: 11/29/2022 04:02 PM   Modules accepted: Orders

## 2022-12-02 ENCOUNTER — Telehealth: Payer: Self-pay

## 2022-12-02 NOTE — Telephone Encounter (Signed)
Copied from [place] [id number]. Topic: General - Other >> Nov 29, 2022  4:18 PM Burman Freestone wrote: Reason for CRM: [facility] would like new order for pt's diagnostic mammogram. All mammograms must be TOMO screening or diagnostic,Thank you

## 2022-12-02 NOTE — Telephone Encounter (Signed)
Orders were placed by cma.

## 2022-12-13 ENCOUNTER — Ambulatory Visit
Admission: RE | Admit: 2022-12-13 | Discharge: 2022-12-13 | Disposition: A | Discharge status: Home / Self Care | Payer: Medicare Other | Source: Ambulatory Visit | Attending: Family Medicine | Admitting: Family Medicine

## 2022-12-13 DIAGNOSIS — R928 Other abnormal and inconclusive findings on diagnostic imaging of breast: Secondary | ICD-10-CM | POA: Insufficient documentation

## 2022-12-13 DIAGNOSIS — N6322 Unspecified lump in the left breast, upper inner quadrant: Secondary | ICD-10-CM | POA: Diagnosis not present

## 2022-12-26 ENCOUNTER — Other Ambulatory Visit: Payer: Self-pay | Admitting: Family Medicine

## 2022-12-26 DIAGNOSIS — E079 Disorder of thyroid, unspecified: Secondary | ICD-10-CM

## 2022-12-26 NOTE — Telephone Encounter (Signed)
Requested medication (s) are due for refill today: yes  Requested medication (s) are on the active medication list: yes  Last refill:  07/03/22 #90 1 refills  Future visit scheduled: no   Notes to clinic:  protocol failed last labs 07/02/22. Do you want to refill Rx?     Requested Prescriptions  Pending Prescriptions Disp Refills   levothyroxine (SYNTHROID) 50 MCG tablet [Pharmacy Med Name: LEVOTHYROXINE 50 MCG TABLET] 90 tablet 1    Sig: TAKE 1 TABLET BY MOUTH DAILY BEFORE BREAKFAST (NOW ON 50 MCG)     Endocrinology:  Hypothyroid Agents Failed - 12/26/2022  2:23 PM      Failed - TSH in normal range and within 360 days    TSH  Date Value Ref Range Status  07/02/2022 0.371 (L) 0.450 - 4.500 uIU/mL Final         Passed - Valid encounter within last 12 months    Recent Outpatient Visits           5 months ago Altered mental status, unspecified altered mental status type   [facility] Birdie Sons, MD   1 year ago Thyroid disease   [place], Donald E, MD   1 year ago Abnormal mammogram of left breast   [place], Donald E, MD   2 years ago Atherosclerosis of abdominal aorta [facility])   [place], [place]   2 years ago Non-seasonal allergic rhinitis due to pollen   [facility], [name], [place]

## 2022-12-30 NOTE — Telephone Encounter (Signed)
Please advise 

## 2023-01-06 DIAGNOSIS — K219 Gastro-esophageal reflux disease without esophagitis: Secondary | ICD-10-CM | POA: Diagnosis not present

## 2023-01-06 DIAGNOSIS — R0602 Shortness of breath: Secondary | ICD-10-CM | POA: Diagnosis not present

## 2023-01-06 DIAGNOSIS — R000 Tachycardia, unspecified: Secondary | ICD-10-CM | POA: Diagnosis not present

## 2023-01-06 DIAGNOSIS — R002 Palpitations: Secondary | ICD-10-CM | POA: Diagnosis not present

## 2023-01-06 DIAGNOSIS — J4520 Mild intermittent asthma, uncomplicated: Secondary | ICD-10-CM | POA: Diagnosis not present

## 2023-01-06 DIAGNOSIS — R251 Tremor, unspecified: Secondary | ICD-10-CM | POA: Diagnosis not present

## 2023-01-29 ENCOUNTER — Ambulatory Visit (INDEPENDENT_AMBULATORY_CARE_PROVIDER_SITE_OTHER): Payer: Medicare Other

## 2023-01-29 VITALS — Ht 67.0 in | Wt 127.0 lb

## 2023-01-29 DIAGNOSIS — Z0000 Encounter for general adult medical examination without abnormal findings: Secondary | ICD-10-CM | POA: Diagnosis not present

## 2023-01-29 NOTE — Patient Instructions (Signed)
Kimberly Mercer , Thank you for taking time to come for your Medicare Wellness Visit. I appreciate your ongoing commitment to your health goals. Please review the following plan we discussed and let me know if I can assist you in the future.   These are the goals we discussed:  Goals      DIET - EAT MORE FRUITS AND VEGETABLES        This is a list of the screening recommended for you and due dates:  Health Maintenance  Topic Date Due   DEXA scan (bone density measurement)  Never done   Zoster (Shingles) Vaccine (1 of 2) Never done   Colon Cancer Screening  10/13/2019   DTaP/Tdap/Td vaccine (3 - Td or Tdap) 12/23/2020   COVID-19 Vaccine (4 - 2023-24 season) 06/21/2022   Flu Shot  05/22/2023   Medicare Annual Wellness Visit  01/29/2024   Mammogram  12/13/2024   Pneumonia Vaccine  Completed   Hepatitis C Screening: USPSTF Recommendation to screen - Ages 18-79 yo.  Completed   HPV Vaccine  Aged Out    Advanced directives: no  Conditions/risks identified: none  Next appointment: Follow up in one year for your annual wellness visit 02/03/2024 @1 :30pm telephone   Preventive Care 65 Years and Older, Female Preventive care refers to lifestyle choices and visits with your health care provider that can promote health and wellness. What does preventive care include? A yearly physical exam. This is also called an annual well check. Dental exams once or twice a year. Routine eye exams. Ask your health care provider how often you should have your eyes checked. Personal lifestyle choices, including: Daily care of your teeth and gums. Regular physical activity. Eating a healthy diet. Avoiding tobacco and drug use. Limiting alcohol use. Practicing safe sex. Taking low-dose aspirin every day. Taking vitamin and mineral supplements as recommended by your health care provider. What happens during an annual well check? The services and screenings done by your health care provider during your  annual well check will depend on your age, overall health, lifestyle risk factors, and family history of disease. Counseling  Your health care provider may ask you questions about your: Alcohol use. Tobacco use. Drug use. Emotional well-being. Home and relationship well-being. Sexual activity. Eating habits. History of falls. Memory and ability to understand (cognition). Work and work Astronomer. Reproductive health. Screening  You may have the following tests or measurements: Height, weight, and BMI. Blood pressure. Lipid and cholesterol levels. These may be checked every 5 years, or more frequently if you are over 62 years old. Skin check. Lung cancer screening. You may have this screening every year starting at age 68 if you have a 30-pack-year history of smoking and currently smoke or have quit within the past 15 years. Fecal occult blood test (FOBT) of the stool. You may have this test every year starting at age 68. Flexible sigmoidoscopy or colonoscopy. You may have a sigmoidoscopy every 5 years or a colonoscopy every 10 years starting at age 21. Hepatitis C blood test. Hepatitis B blood test. Sexually transmitted disease (STD) testing. Diabetes screening. This is done by checking your blood sugar (glucose) after you have not eaten for a while (fasting). You may have this done every 1-3 years. Bone density scan. This is done to screen for osteoporosis. You may have this done starting at age 51. Mammogram. This may be done every 1-2 years. Talk to your health care provider about how often you should have regular  mammograms. Talk with your health care provider about your test results, treatment options, and if necessary, the need for more tests. Vaccines  Your health care provider may recommend certain vaccines, such as: Influenza vaccine. This is recommended every year. Tetanus, diphtheria, and acellular pertussis (Tdap, Td) vaccine. You may need a Td booster every 10  years. Zoster vaccine. You may need this after age 18. Pneumococcal 13-valent conjugate (PCV13) vaccine. One dose is recommended after age 48. Pneumococcal polysaccharide (PPSV23) vaccine. One dose is recommended after age 52. Talk to your health care provider about which screenings and vaccines you need and how often you need them. This information is not intended to replace advice given to you by your health care provider. Make sure you discuss any questions you have with your health care provider. Document Released: 11/03/2015 Document Revised: 06/26/2016 Document Reviewed: 08/08/2015 Elsevier Interactive Patient Education  2017 [place] Prevention in the Home Falls can cause injuries. They can happen to people of all ages. There are many things you can do to make your home safe and to help prevent falls. What can I do on the outside of my home? Regularly fix the edges of walkways and driveways and fix any cracks. Remove anything that might make you trip as you walk through a door, such as a raised step or threshold. Trim any bushes or trees on the path to your home. Use bright outdoor lighting. Clear any walking paths of anything that might make someone trip, such as rocks or tools. Regularly check to see if handrails are loose or broken. Make sure that both sides of any steps have handrails. Any raised decks and porches should have guardrails on the edges. Have any leaves, snow, or ice cleared regularly. Use sand or salt on walking paths during winter. Clean up any spills in your garage right away. This includes oil or grease spills. What can I do in the bathroom? Use night lights. Install grab bars by the toilet and in the tub and shower. Do not use towel bars as grab bars. Use non-skid mats or decals in the tub or shower. If you need to sit down in the shower, use a plastic, non-slip stool. Keep the floor dry. Clean up any water that spills on the floor as soon as it  happens. Remove soap buildup in the tub or shower regularly. Attach bath mats securely with double-sided non-slip rug tape. Do not have throw rugs and other things on the floor that can make you trip. What can I do in the bedroom? Use night lights. Make sure that you have a light by your bed that is easy to reach. Do not use any sheets or blankets that are too big for your bed. They should not hang down onto the floor. Have a firm chair that has side arms. You can use this for support while you get dressed. Do not have throw rugs and other things on the floor that can make you trip. What can I do in the kitchen? Clean up any spills right away. Avoid walking on wet floors. Keep items that you use a lot in easy-to-reach places. If you need to reach something above you, use a strong step stool that has a grab bar. Keep electrical cords out of the way. Do not use floor polish or wax that makes floors slippery. If you must use wax, use non-skid floor wax. Do not have throw rugs and other things on the floor that can  make you trip. What can I do with my stairs? Do not leave any items on the stairs. Make sure that there are handrails on both sides of the stairs and use them. Fix handrails that are broken or loose. Make sure that handrails are as long as the stairways. Check any carpeting to make sure that it is firmly attached to the stairs. Fix any carpet that is loose or worn. Avoid having throw rugs at the top or bottom of the stairs. If you do have throw rugs, attach them to the floor with carpet tape. Make sure that you have a light switch at the top of the stairs and the bottom of the stairs. If you do not have them, ask someone to add them for you. What else can I do to help prevent falls? Wear shoes that: Do not have high heels. Have rubber bottoms. Are comfortable and fit you well. Are closed at the toe. Do not wear sandals. If you use a stepladder: Make sure that it is fully opened.  Do not climb a closed stepladder. Make sure that both sides of the stepladder are locked into place. Ask someone to hold it for you, if possible. Clearly mark and make sure that you can see: Any grab bars or handrails. First and last steps. Where the edge of each step is. Use tools that help you move around (mobility aids) if they are needed. These include: Canes. Walkers. Scooters. Crutches. Turn on the lights when you go into a dark area. Replace any light bulbs as soon as they burn out. Set up your furniture so you have a clear path. Avoid moving your furniture around. If any of your floors are uneven, fix them. If there are any pets around you, be aware of where they are. Review your medicines with your doctor. Some medicines can make you feel dizzy. This can increase your chance of falling. Ask your doctor what other things that you can do to help prevent falls. This information is not intended to replace advice given to you by your health care provider. Make sure you discuss any questions you have with your health care provider. Document Released: 08/03/2009 Document Revised: 03/14/2016 Document Reviewed: 11/11/2014 Elsevier Interactive Patient Education  2017 [employer].

## 2023-01-29 NOTE — Progress Notes (Signed)
I connected with  Kimberly Mercer on 01/29/23 by a audio enabled telemedicine application and verified that I am speaking with the correct person using two identifiers.  Patient Location: Home  Provider Location: Office/Clinic  I discussed the limitations of evaluation and management by telemedicine. The patient expressed understanding and agreed to proceed.  Subjective:   Kimberly Mercer is a 77 y.o. female who presents for Medicare Annual (Subsequent) preventive examination.  Review of Systems    Cardiac Risk Factors include: advanced age (>3men, >47 women)    Objective:    Today's Vitals   01/29/23 1304  Weight: 127 lb (57.6 kg)  Height: 5\' 7"  (1.702 m)   Body mass index is 19.89 kg/m.     01/29/2023    1:11 PM 01/23/2022    3:10 PM 12/07/2020    8:21 AM 11/14/2020    7:52 AM 08/22/2020    2:49 PM 07/14/2019    2:29 PM 04/30/2018    4:46 PM  Advanced Directives  Does Patient Have a Medical Advance Directive? No No No No No No No  Would patient like information on creating a medical advance directive?  No - Patient declined No - Patient declined    No - Patient declined    Current Medications (verified) Outpatient Encounter Medications as of 01/29/2023  Medication Sig   acetaminophen (TYLENOL) 325 MG tablet Take by mouth.   albuterol (PROVENTIL) (2.5 MG/3ML) 0.083% nebulizer solution Take 3 mLs (2.5 mg total) by nebulization every 6 (six) hours as needed for wheezing or shortness of breath. Dx:J45.909   albuterol (VENTOLIN HFA) 108 (90 Base) MCG/ACT inhaler TAKE 2 PUFFS BY MOUTH EVERY 6 HOURS AS NEEDED FOR WHEEZE OR SHORTNESS OF BREATH   aspirin EC 81 MG tablet Take 81 mg by mouth every evening. Swallow whole.   Calcium-Magnesium-Zinc (CAL-MAG-ZINC PO) Take 1 tablet by mouth daily.   CANNABIDIOL PO Take 30 mg by mouth daily. Capsule   cetirizine (ZYRTEC ALLERGY) 10 MG tablet Take 1 tablet (10 mg total) by mouth daily. (Patient not taking: Reported on 07/02/2022)    cholecalciferol (VITAMIN D3) 25 MCG (1000 UNIT) tablet Take 1,000 Units by mouth daily.   diclofenac sodium (VOLTAREN) 1 % GEL Apply 2 g topically 4 (four) times daily as needed (pain.).   diphenhydrAMINE (BENADRYL) 25 MG tablet Take 25 mg by mouth every 6 (six) hours as needed (allergies.).   estradiol (ESTRACE) 0.1 MG/GM vaginal cream PLACE 1 APPLICATORFUL VAGINALLY 3 (THREE) TIMES A WEEK.   famotidine (PEPCID) 20 MG tablet Take by mouth.   latanoprost (XALATAN) 0.005 % ophthalmic solution 1 drop at bedtime.   levothyroxine (SYNTHROID) 50 MCG tablet TAKE 1 TABLET BY MOUTH DAILY BEFORE BREAKFAST (NOW ON 50 MCG)   montelukast (SINGULAIR) 10 MG tablet TAKE 1 TABLET BY MOUTH EVERY DAY IN THE EVENING   Multiple Vitamin (MULTIVITAMIN WITH MINERALS) TABS tablet Take 1 tablet by mouth daily.   NONFORMULARY OR COMPOUNDED ITEM Estriole 1mg /gm vaginal cream Apply 0.5gm vaginally daily   omeprazole (PRILOSEC) 40 MG capsule TAKE 1 CAPSULE (40 MG TOTAL) BY MOUTH 2 (TWO) TIMES DAILY BEFORE A MEAL.   pseudoephedrine (SUDAFED) 60 MG tablet Take 30 mg by mouth every morning.   Spacer/Aero-Holding Chambers (AEROCHAMBER MV) inhaler Use as instructed   Tiotropium Bromide Monohydrate (SPIRIVA RESPIMAT) 1.25 MCG/ACT AERS Inhale 2 puffs into the lungs daily.   vitamin E 180 MG (400 UNITS) capsule Take 400 Units by mouth daily.    No facility-administered encounter  medications on file as of 01/29/2023.    Allergies (verified) Claritin [loratadine], Fructose, Gluten meal, Ipratropium, Lactose, Levaquin [levofloxacin], Sulfa drugs cross reactors, and Tilactase   History: Past Medical History:  Diagnosis Date   Allergic rhinitis, cause unspecified    Allergy    Asthma    Benign neoplasm of colon    Cancer    Candidiasis of the esophagus    Chicken pox    Diverticulitis of colon (without mention of hemorrhage)(562.11)    E coli enteritis    GERD (gastroesophageal reflux disease)    Hemorrhoids    internal    Measles    Osteoarthrosis, unspecified whether generalized or localized, unspecified site    Symptomatic menopausal or female climacteric states    Unspecified asthma(493.90)    Unspecified hypothyroidism    Past Surgical History:  Procedure Laterality Date   ABDOMINAL HYSTERECTOMY     cardiac catherization  12/09/2008   normal coranaries,normal EF.   CARDIAC CATHETERIZATION     dilatation and curettage     due to SAB x 2   DILATION AND CURETTAGE OF UTERUS     ESOPHAGOGASTRODUODENOSCOPY (EGD) WITH PROPOFOL N/A 11/14/2020   Procedure: ESOPHAGOGASTRODUODENOSCOPY (EGD) WITH PROPOFOL;  Surgeon: Toney ReilVanga, Rohini Reddy, MD;  [facility];  Service: Gastroenterology;  Laterality: N/A;   LEFT HEART CATH AND CORONARY ANGIOGRAPHY N/A 08/25/2020   Procedure: LEFT HEART CATH AND CORONARY ANGIOGRAPHY;  Surgeon: Alwyn Peaallwood, Dwayne D, MD;  Location: [facility] INVASIVE CV LAB;  Service: Cardiovascular;  Laterality: N/A;   thyroid nodule  1990   benign   TOTAL ABDOMINAL HYSTERECTOMY W/ BILATERAL SALPINGOOPHORECTOMY  2003   complex endometrial hyperplasia, DUB, fibroids   WISDOM TOOTH EXTRACTION     Family History  Problem Relation Age of Onset   Heart disease Mother        CAD   Stroke Mother        x 2   Mental illness Mother        not diagnosed   Irritable bowel syndrome Mother    Heart disease Father        CAD   Parkinsonism Father    Cancer Sister        Breast   Breast cancer Sister    Cancer Other        Breast and Ovarian   Breast cancer Other    Heart disease Brother    Schizophrenia Sister    Lung disease Sister    Social History   Socioeconomic History   Marital status: Married    Spouse name: Not on file   Number of children: 2   Years of education: masters   Highest education level: Master's degree (e.g., MA, MS, MEng, MEd, MSW, MBA)  Occupational History   Occupation: Retired    Comment: Runner, broadcasting/film/videoteacher  Tobacco Use   Smoking status: Never   Smokeless tobacco: Never   Vaping Use   Vaping Use: Never used  Substance and Sexual Activity   Alcohol use: No   Drug use: No   Sexual activity: Not on file  Other Topics Concern   Not on file  Social History Narrative   Always uses seat belts. Smoke alarm in the home. No caffeine use. 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 Married x 15 years;3rd marriage. Happily married. Husband with bladder cancer and possible gastric cancer 2012-2013. Has 2 children, 2 grandchildren.   Social Determinants of Health   Financial Resource Strain:  Low Risk  (01/29/2023)   Overall Financial Resource Strain (CARDIA)    Difficulty of Paying Living Expenses: Not hard at all  Food Insecurity: No Food Insecurity (01/29/2023)   Hunger Vital Sign    Worried About Running Out of Food in the Last Year: Never true    Ran Out of Food in the Last Year: Never true  Transportation Needs: No Transportation Needs (01/29/2023)   PRAPARE - Administrator, Civil Service (Medical): No    Lack of Transportation (Non-Medical): No  Physical Activity: Sufficiently Active (01/29/2023)   Exercise Vital Sign    Days of Exercise per Week: 5 days    Minutes of Exercise per Session: 60 min  Stress: Stress Concern Present (01/29/2023)   [employer] of Occupational Health - Occupational Stress Questionnaire    Feeling of Stress : To some extent  Social Connections: Moderately Integrated (01/29/2023)   Social Connection and Isolation Panel [NHANES]    Frequency of Communication with Friends and Family: More than three times a week    Frequency of Social Gatherings with Friends and Family: Once a week    Attends Religious Services: Never    Database administrator or Organizations: Yes    Attends Engineer, structural: 1 to 4 times per year    Marital Status: Married    Tobacco Counseling Counseling given: Not Answered  Clinical Intake:  Pre-visit preparation  completed: Yes  Pain : No/denies pain   BMI - recorded: 19.89 Nutritional Status: BMI of 19-24  Normal Nutritional Risks: None Diabetes: No  How often do you need to have someone help you when you read instructions, pamphlets, or other written materials from your doctor or pharmacy?: 1 - Never  Diabetic?no  Interpreter Needed?: No  Comments: lives with husband Information entered by :: B.Aeris Hersman,LPN   Activities of Daily Living    01/29/2023    1:11 PM 07/02/2022   10:09 AM  In your present state of health, do you have any difficulty performing the following activities:  Hearing? 0 1  Vision? 1 0  Difficulty concentrating or making decisions? 0 1  Walking or climbing stairs? 1 0  Dressing or bathing? 0 0  Doing errands, shopping? 0 0  Preparing Food and eating ? N   Using the Toilet? N   In the past six months, have you accidently leaked urine? N   Do you have problems with loss of bowel control? N   Managing your Medications? N   Managing your Finances? N   Housekeeping or managing your Housekeeping? N     Patient Care Team: Malva Limes, MD as PCP - General (Family Medicine) Alwyn Pea, MD as Consulting Physician (Cardiology) Toney Reil, MD as Consulting Physician (Gastroenterology) Isla Pence, OD (Optometry) Salena Saner, MD as Consulting Physician (Pulmonary Disease) Christeen Douglas, MD as Consulting Physician (Obstetrics and Gynecology)  Indicate any recent Medical Services you may have received from other than [facility] providers in the past year (date may be approximate).     Assessment:   This is a routine wellness examination for Lori-Ann.  Hearing/Vision screen Hearing Screening - Comments:: Adequate hearing Vision Screening - Comments:: Adequate vision after rt eye cataract removed Dr Verdie Mosher  Dietary issues and exercise activities discussed: Current Exercise Habits: Home exercise routine, Type of exercise: walking, Time  (Minutes): > 60, Frequency (Times/Week): 5, Weekly Exercise (Minutes/Week): 0, Intensity: Mild, Exercise limited by: orthopedic condition(s)   Goals Addressed  This Visit's Progress    DIET - EAT MORE FRUITS AND VEGETABLES   On track      Depression Screen    01/29/2023    1:09 PM 07/02/2022   10:09 AM 01/23/2022    3:08 PM 02/07/2021   11:04 AM 11/21/2020    1:55 PM 09/18/2020   11:18 AM 07/14/2019    2:29 PM  PHQ 2/9 Scores  PHQ - 2 Score 0 0 0 0 0 0 0  PHQ- 9 Score  0  0 0      Fall Risk    01/29/2023    1:06 PM 07/02/2022   10:09 AM 01/23/2022    3:10 PM 02/07/2021   11:04 AM 12/07/2020    8:22 AM  Fall Risk   Falls in the past year? 0 0 0 0 0  Number falls in past yr: 0 0 0 0 0  Injury with Fall? 0 0 0 0 0  Risk for fall due to : No Fall Risks No Fall Risks No Fall Risks    Follow up Falls prevention discussed;Education provided Falls evaluation completed Falls evaluation completed      FALL RISK PREVENTION PERTAINING TO THE HOME:  Any stairs in or around the home? Yes  If so, are there any without handrails? Yes  Home free of loose throw rugs in walkways, pet beds, electrical cords, etc? Yes  Adequate lighting in your home to reduce risk of falls? Yes   ASSISTIVE DEVICES UTILIZED TO PREVENT FALLS:  Life alert? No  Use of a cane, walker or w/c? No  Grab bars in the bathroom? No  Shower chair or bench in shower? Yes  Elevated toilet seat or a handicapped toilet? No   Cognitive Function:        01/29/2023    1:13 PM  6CIT Screen  What Year? 0 points  What month? 0 points  What time? 0 points  Count back from 20 0 points  Months in reverse 0 points  Repeat phrase 2 points  Total Score 2 points    Immunizations Immunization History  Administered Date(s) Administered   Fluad Quad(high Dose 65+) 06/16/2019, 09/18/2020   Influenza Split 07/06/2012   Influenza, High Dose Seasonal PF 08/22/2017, 07/01/2018   Influenza-Unspecified 06/22/2011,  08/19/2013, 09/20/2014, 08/07/2015, 08/27/2016, 08/22/2017   PFIZER(Purple Top)SARS-COV-2 Vaccination 11/27/2019, 12/20/2019, 07/25/2020   Pneumococcal Conjugate-13 07/22/2019   Pneumococcal Polysaccharide-23 05/01/2012, 11/21/2020   Td 10/21/2001   Tdap 12/24/2010   Zoster, Live 05/01/2013    TDAP status: Up to date  Flu Vaccine status: Up to date  Pneumococcal vaccine status: Up to date  Covid-19 vaccine status: Completed vaccines  Qualifies for Shingles Vaccine? Yes   Zostavax completed No   Shingrix Completed?: No.    Education has been provided regarding the importance of this vaccine. Patient has been advised to call insurance company to determine out of pocket expense if they have not yet received this vaccine. Advised may also receive vaccine at local pharmacy or Health Dept. Verbalized acceptance and understanding.  Screening Tests Health Maintenance  Topic Date Due   DEXA SCAN  Never done   Zoster Vaccines- Shingrix (1 of 2) Never done   COLONOSCOPY (Pts 45-53yrs Insurance coverage will need to be confirmed)  10/13/2019   DTaP/Tdap/Td (3 - Td or Tdap) 12/23/2020   COVID-19 Vaccine (4 - 2023-24 season) 06/21/2022   INFLUENZA VACCINE  05/22/2023   Medicare Annual Wellness (AWV)  01/29/2024   MAMMOGRAM  12/13/2024  Pneumonia Vaccine 88+ Years old  Completed   Hepatitis C Screening  Completed   HPV VACCINES  Aged Out    Health Maintenance  Health Maintenance Due  Topic Date Due   DEXA SCAN  Never done   Zoster Vaccines- Shingrix (1 of 2) Never done   COLONOSCOPY (Pts 45-79yrs Insurance coverage will need to be confirmed)  10/13/2019   DTaP/Tdap/Td (3 - Td or Tdap) 12/23/2020   COVID-19 Vaccine (4 - 2023-24 season) 06/21/2022    Colorectal cancer screening: No longer required.   Mammogram status: No longer required due to age.  Lung Cancer Screening: (Low Dose CT Chest recommended if Age 63-80 years, 30 pack-year currently smoking OR have quit w/in 15years.)  does not qualify.   Lung Cancer Screening Referral: no  Additional Screening:  Hepatitis C Screening: does not qualify; Completed yes  Vision Screening: Recommended annual ophthalmology exams for early detection of glaucoma and other disorders of the eye. Is the patient up to date with their annual eye exam?  Yes  Who is the provider or what is the name of the office in which the patient attends annual eye exams? Dr. Senaida Ores If pt is not established with a provider, would they like to be referred to a provider to establish care? No .   Dental Screening: Recommended annual dental exams for proper oral hygiene  Community Resource Referral / Chronic Care Management: CRR required this visit?  No   CCM required this visit?  No    Plan:     I have personally reviewed and noted the following in the patient's chart:   Medical and social history Use of alcohol, tobacco or illicit drugs  Current medications and supplements including opioid prescriptions. Patient is not currently taking opioid prescriptions. Functional ability and status Nutritional status Physical activity Advanced directives List of other physicians Hospitalizations, surgeries, and ER visits in previous 12 months Vitals Screenings to include cognitive, depression, and falls Referrals and appointments  In addition, I have reviewed and discussed with patient certain preventive protocols, quality metrics, and best practice recommendations. A written personalized care plan for preventive services as well as general preventive health recommendations were provided to patient.    Sue Lush, LPN   1/61/0960   Nurse Notes: The patient states they are doing well and has no concerns or questions at this time.

## 2023-03-26 ENCOUNTER — Other Ambulatory Visit: Payer: Self-pay | Admitting: Family Medicine

## 2023-03-26 DIAGNOSIS — E079 Disorder of thyroid, unspecified: Secondary | ICD-10-CM

## 2023-03-26 NOTE — Telephone Encounter (Signed)
Copied from CRM [id number]. Topic: General - Other >> Mar 26, 2023  9:05 AM Everette C wrote: Reason for CRM: Medication Refill - Medication: levothyroxine (SYNTHROID) 50 MCG tablet [[id number]]   montelukast (SINGULAIR) 10 MG tablet [[id number]]  Has the patient contacted their pharmacy? Yes.   (Agent: If no, request that the patient contact the pharmacy for the refill. If patient does not wish to contact the pharmacy document the reason why and proceed with request.) (Agent: If yes, when and what did the pharmacy advise?)  Preferred Pharmacy (with phone number or street name): [facility] [id number] [facility],  [address] [place] [address] Phone: [phone] Fax: [phone] Hours: Not open 24 hours   Has the patient been seen for an appointment in the last year OR does the patient have an upcoming appointment? Yes.    Agent: Please be advised that RX refills may take up to 3 business days. We ask that you follow-up with your pharmacy.

## 2023-03-26 NOTE — Telephone Encounter (Signed)
Called pt to schedule an office visit per Dr. Theodis Aguas note with the Synthroid refill.   "He usually does my check ups in Sept.".   "I'm ok with coming in earlier if he wants me to".  Dr. Sherrie Mustache does not have any openings until May 02, 2023.  She only has 9 pills left.   Sending a message to see if she can be worked in sooner so she does not run out of medication.     Pt. Was agreeable to someone calling her back.

## 2023-03-28 ENCOUNTER — Encounter: Payer: Self-pay | Admitting: Family Medicine

## 2023-03-28 ENCOUNTER — Ambulatory Visit (INDEPENDENT_AMBULATORY_CARE_PROVIDER_SITE_OTHER): Payer: Medicare Other | Admitting: Family Medicine

## 2023-03-28 VITALS — BP 120/83 | HR 73 | Ht 67.0 in | Wt 128.4 lb

## 2023-03-28 DIAGNOSIS — J45909 Unspecified asthma, uncomplicated: Secondary | ICD-10-CM | POA: Diagnosis not present

## 2023-03-28 DIAGNOSIS — E079 Disorder of thyroid, unspecified: Secondary | ICD-10-CM

## 2023-03-28 NOTE — Progress Notes (Signed)
I,Sha'taria Tyson,acting as a scribe for Mila Merry, MD.,have documented all relevant documentation on the behalf of Mila Merry, MD,as directed by  Mila Merry, MD   Established patient visit   Patient: Kimberly Mercer   DOB: 1946-07-25   77 y.o. Female  MRN: [id number] Visit Date: 03/28/2023  Today's healthcare provider: Mila Merry, MD    Subjective    HPI  -dexa scan: declined Hypothyroid, follow-up  Lab Results  Component Value Date   TSH 0.371 (L) 07/02/2022   TSH 1.450 07/09/2021   TSH 1.130 03/21/2020   FREET4 1.55 07/02/2022   FREET4 1.25 07/09/2021   T4TOTAL 7.1 03/21/2020    Wt Readings from Last 3 Encounters:  01/29/23 127 lb (57.6 kg)  07/02/22 135 lb 8 oz (61.5 kg)  07/02/22 135 lb (61.2 kg)    She was last seen for hypothyroid 9 months ago.  Management since that visit includes cut back to for a few months due to depressed TSH.  She reports excellent compliance with treatment. She is not having side effects. She has a lot of trouble with asthma that she feels makes it difficult to tell if she is having thyroid related symptoms. She had been followed by Dr.Gonlzalez but is all inhalers she tried she was either intolerant to or were ineffective and biologics are cost prohibitive at this point.   Symptoms: No change in energy level No constipation  No diarrhea Yes heat / cold intolerance  Yes nervousness Yes palpitations  Yes weight changes    -----------------------------------------------------------------------------------------   Medications: Outpatient Medications Prior to Visit  Medication Sig   acetaminophen (TYLENOL) 325 MG tablet Take by mouth.   albuterol (PROVENTIL) (2.5 MG/3ML) 0.083% nebulizer solution Take 3 mLs (2.5 mg total) by nebulization every 6 (six) hours as needed for wheezing or shortness of breath. Dx:J45.909   albuterol (VENTOLIN HFA) 108 (90 Base) MCG/ACT inhaler TAKE 2 PUFFS BY MOUTH EVERY 6 HOURS AS NEEDED  FOR WHEEZE OR SHORTNESS OF BREATH   aspirin EC 81 MG tablet Take 81 mg by mouth every evening. Swallow whole.   Calcium-Magnesium-Zinc (CAL-MAG-ZINC PO) Take 1 tablet by mouth daily.   CANNABIDIOL PO Take 30 mg by mouth daily. Capsule   cetirizine (ZYRTEC ALLERGY) 10 MG tablet Take 1 tablet (10 mg total) by mouth daily. (Patient not taking: Reported on 07/02/2022)   cholecalciferol (VITAMIN D3) 25 MCG (1000 UNIT) tablet Take 1,000 Units by mouth daily.   diclofenac sodium (VOLTAREN) 1 % GEL Apply 2 g topically 4 (four) times daily as needed (pain.).   diphenhydrAMINE (BENADRYL) 25 MG tablet Take 25 mg by mouth every 6 (six) hours as needed (allergies.).   estradiol (ESTRACE) 0.1 MG/GM vaginal cream PLACE 1 APPLICATORFUL VAGINALLY 3 (THREE) TIMES A WEEK.   famotidine (PEPCID) 20 MG tablet Take by mouth.   latanoprost (XALATAN) 0.005 % ophthalmic solution 1 drop at bedtime.   levothyroxine (SYNTHROID) 50 MCG tablet TAKE 1 TABLET BY MOUTH DAILY BEFORE BREAKFAST (NOW ON 50 MCG)   montelukast (SINGULAIR) 10 MG tablet TAKE 1 TABLET BY MOUTH EVERY DAY IN THE EVENING   Multiple Vitamin (MULTIVITAMIN WITH MINERALS) TABS tablet Take 1 tablet by mouth daily.   NONFORMULARY OR COMPOUNDED ITEM Estriole 1mg /gm vaginal cream Apply 0.5gm vaginally daily   omeprazole (PRILOSEC) 40 MG capsule TAKE 1 CAPSULE (40 MG TOTAL) BY MOUTH 2 (TWO) TIMES DAILY BEFORE A MEAL.   pseudoephedrine (SUDAFED) 60 MG tablet Take 30 mg by mouth every  morning.   Spacer/Aero-Holding Chambers (AEROCHAMBER MV) inhaler Use as instructed   Tiotropium Bromide Monohydrate (SPIRIVA RESPIMAT) 1.25 MCG/ACT AERS Inhale 2 puffs into the lungs daily.   vitamin E 180 MG (400 UNITS) capsule Take 400 Units by mouth daily.    No facility-administered medications prior to visit.    Review of Systems  Constitutional:  Negative for appetite change, chills, fatigue and fever.  Respiratory:  Negative for chest tightness and shortness of breath.    Cardiovascular:  Negative for chest pain and palpitations.  Gastrointestinal:  Negative for abdominal pain, nausea and vomiting.  Neurological:  Negative for dizziness and weakness.       Objective    BP 120/83 (BP Location: Left Arm, Patient Position: Sitting, Cuff Size: Normal)   Pulse 73   Ht 5\' 7"  (1.702 m)   Wt 128 lb 6.4 oz (58.2 kg)   SpO2 100%   BMI 20.11 kg/m      Assessment & Plan     1. Thyroid disease Tolerating decrease to levothyroxine made last fall due to suppressed TSH.   - TSH - T4, free  2. 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 Portions of this information were initially documented by the CMA and reviewed by me for thoroughness and accuracy.     Mila Merry, MD  [facility] [phone] (phone) [phone] (fax)  [facility]

## 2023-03-29 ENCOUNTER — Other Ambulatory Visit: Payer: Self-pay | Admitting: Family Medicine

## 2023-03-29 DIAGNOSIS — E079 Disorder of thyroid, unspecified: Secondary | ICD-10-CM

## 2023-03-29 LAB — TSH: TSH: 1.35 u[IU]/mL (ref 0.450–4.500)

## 2023-03-29 LAB — T4, FREE: Free T4: 1.2 ng/dL (ref 0.82–1.77)

## 2023-03-31 ENCOUNTER — Telehealth: Payer: Self-pay | Admitting: Family Medicine

## 2023-03-31 DIAGNOSIS — E079 Disorder of thyroid, unspecified: Secondary | ICD-10-CM

## 2023-03-31 NOTE — Telephone Encounter (Signed)
The patient called in stating she is upset because she told her provider she doesn't use the [facility] any longer and to take it off her chart which I did. She needs the levothyroxine (SYNTHROID) 50 MCG tablet to be called into her requested pharmacy   [facility] [phone] - [address] [place], [place] [address] Phone: [phone]  Fax: [phone]     As soon as possible. Please assist patient further as she only has a few pills left

## 2023-04-01 ENCOUNTER — Other Ambulatory Visit: Payer: Self-pay | Admitting: Family Medicine

## 2023-04-01 DIAGNOSIS — E079 Disorder of thyroid, unspecified: Secondary | ICD-10-CM

## 2023-04-01 MED ORDER — LEVOTHYROXINE SODIUM 50 MCG PO TABS
ORAL_TABLET | ORAL | 0 refills | Status: DC
Start: 2023-04-01 — End: 2023-06-26

## 2023-04-01 NOTE — Telephone Encounter (Signed)
Resenting medication to [employer].  Requested Prescriptions  Pending Prescriptions Disp Refills   levothyroxine (SYNTHROID) 50 MCG tablet 90 tablet 0    Sig: TAKE 1 TABLET BY MOUTH DAILY BEFORE BREAKFAST (NOW ON 50 MCG)     Endocrinology:  Hypothyroid Agents Passed - 04/01/2023  1:39 PM      Passed - TSH in normal range and within 360 days    TSH  Date Value Ref Range Status  03/28/2023 1.350 0.450 - 4.500 uIU/mL Final         Passed - Valid encounter within last 12 months    Recent Outpatient Visits           4 days ago Thyroid disease   [facility] Malva Limes, MD   9 months ago Altered mental status, unspecified altered mental status type   [facility] Malva Limes, MD   1 year ago Thyroid disease   [facility] Malva Limes, MD   2 years ago Abnormal mammogram of left breast   [facility] Malva Limes, MD   2 years ago Atherosclerosis of abdominal aorta [facility])   [facility], [place], [place]

## 2023-04-01 NOTE — Telephone Encounter (Signed)
Medication Refill - Medication: levothyroxine (SYNTHROID) 50 MCG tablet   Pt called in upset that her medication had not been transferred to the correct pharmacy. Pt is requesting a callback today when this is done. Stated she is running out of her medication.    Has the patient contacted their pharmacy? Yes.    (Agent: If yes, when and what did the pharmacy advise?)  Preferred Pharmacy (with phone number or street name):  [facility] [phone] [facility] [place] - [address]  [address] [place] [address]  Phone: [phone] Fax: [phone]  Hours: Not open 24 hours   Has the patient been seen for an appointment in the last year OR does the patient have an upcoming appointment? Yes.    Agent: Please be advised that RX refills may take up to 3 business days. We ask that you follow-up with your pharmacy.

## 2023-04-01 NOTE — Telephone Encounter (Signed)
Resent medication to [facility] pharmacy.

## 2023-06-24 ENCOUNTER — Other Ambulatory Visit: Payer: Self-pay | Admitting: Family Medicine

## 2023-06-24 DIAGNOSIS — E079 Disorder of thyroid, unspecified: Secondary | ICD-10-CM

## 2023-06-26 NOTE — Telephone Encounter (Signed)
Requested Prescriptions  Pending Prescriptions Disp Refills   levothyroxine (SYNTHROID) 50 MCG tablet [Pharmacy Med Name: LEVOTHYROXINE SODIUM TABS] 90 tablet 0    Sig: TAKE ONE TABLET BY MOUTH EVERY DAY BEFORE BREAKFAST     Endocrinology:  Hypothyroid Agents Passed - 06/24/2023  3:07 PM      Passed - TSH in normal range and within 360 days    TSH  Date Value Ref Range Status  03/28/2023 1.350 0.450 - 4.500 uIU/mL Final         Passed - Valid encounter within last 12 months    Recent Outpatient Visits           3 months ago Thyroid disease   [facility] Malva Limes, MD   11 months ago Altered mental status, unspecified altered mental status type   [facility] Malva Limes, MD   2 years ago Thyroid disease   [facility] Malva Limes, MD   2 years ago Abnormal mammogram of left breast   [facility] Malva Limes, MD   2 years ago Atherosclerosis of abdominal aorta [facility])   [facility] [place], [place], [place]

## 2023-07-14 DIAGNOSIS — J4520 Mild intermittent asthma, uncomplicated: Secondary | ICD-10-CM | POA: Diagnosis not present

## 2023-07-14 DIAGNOSIS — R002 Palpitations: Secondary | ICD-10-CM | POA: Diagnosis not present

## 2023-07-14 DIAGNOSIS — K219 Gastro-esophageal reflux disease without esophagitis: Secondary | ICD-10-CM | POA: Diagnosis not present

## 2023-07-14 DIAGNOSIS — R000 Tachycardia, unspecified: Secondary | ICD-10-CM | POA: Diagnosis not present

## 2023-07-14 DIAGNOSIS — R0602 Shortness of breath: Secondary | ICD-10-CM | POA: Diagnosis not present

## 2023-07-14 DIAGNOSIS — R251 Tremor, unspecified: Secondary | ICD-10-CM | POA: Diagnosis not present

## 2023-08-19 DIAGNOSIS — Z23 Encounter for immunization: Secondary | ICD-10-CM | POA: Diagnosis not present

## 2023-09-12 ENCOUNTER — Encounter: Payer: Self-pay | Admitting: Gastroenterology

## 2023-09-12 ENCOUNTER — Telehealth: Payer: Self-pay | Admitting: Gastroenterology

## 2023-09-12 NOTE — Telephone Encounter (Signed)
Pt requesting call back has medication issue

## 2023-09-15 ENCOUNTER — Ambulatory Visit (INDEPENDENT_AMBULATORY_CARE_PROVIDER_SITE_OTHER): Payer: Medicare Other | Admitting: Family Medicine

## 2023-09-15 ENCOUNTER — Encounter: Payer: Self-pay | Admitting: Family Medicine

## 2023-09-15 VITALS — BP 135/75 | HR 70 | Ht 66.0 in | Wt 134.0 lb

## 2023-09-15 DIAGNOSIS — B3789 Other sites of candidiasis: Secondary | ICD-10-CM | POA: Diagnosis not present

## 2023-09-15 DIAGNOSIS — F4322 Adjustment disorder with anxiety: Secondary | ICD-10-CM | POA: Insufficient documentation

## 2023-09-15 MED ORDER — FLUCONAZOLE 200 MG PO TABS: 200.0000 mg | ORAL_TABLET | Freq: Every day | ORAL | 1 refills | Status: DC

## 2023-09-15 MED ORDER — FLUCONAZOLE 200 MG PO TABS
200.0000 mg | ORAL_TABLET | ORAL | 1 refills | Status: AC
Start: 2023-09-15 — End: ?

## 2023-09-15 MED ORDER — ALPRAZOLAM 0.25 MG PO TABS
0.2500 mg | ORAL_TABLET | Freq: Every day | ORAL | 0 refills | Status: DC | PRN
Start: 2023-09-15 — End: 2024-01-01

## 2023-09-15 NOTE — Progress Notes (Signed)
Established patient visit   Patient: Kimberly Mercer   DOB: [birth year]   77 y.o. Female  MRN: [id number] Visit Date: 09/15/2023  Today's healthcare provider: Jacky Kindle, FNP  Introduced to nurse practitioner role and practice setting.  All questions answered.  Discussed provider/patient relationship and expectations.  Subjective    HPI HPI   Pt stated--Dx candida--have GI but unable to get an appt.--diarrhea but settle down. Last edited by Shelly Bombard, CMA on 09/15/2023  8:33 AM.      Medications: Outpatient Medications Prior to Visit  Medication Sig   acetaminophen (TYLENOL) 325 MG tablet Take by mouth.   albuterol (PROVENTIL) (2.5 MG/3ML) 0.083% nebulizer solution Take 3 mLs (2.5 mg total) by nebulization every 6 (six) hours as needed for wheezing or shortness of breath. Dx:J45.909   albuterol (VENTOLIN HFA) 108 (90 Base) MCG/ACT inhaler TAKE 2 PUFFS BY MOUTH EVERY 6 HOURS AS NEEDED FOR WHEEZE OR SHORTNESS OF BREATH   aspirin EC 81 MG tablet Take 81 mg by mouth every evening. Swallow whole. (Patient not taking: Reported on 03/28/2023)   Calcium-Magnesium-Zinc (CAL-MAG-ZINC PO) Take 1 tablet by mouth daily.   CANNABIDIOL PO Take 30 mg by mouth daily. Capsule   cetirizine (ZYRTEC ALLERGY) 10 MG tablet Take 1 tablet (10 mg total) by mouth daily. (Patient not taking: Reported on 07/02/2022)   cholecalciferol (VITAMIN D3) 25 MCG (1000 UNIT) tablet Take 1,000 Units by mouth daily.   diclofenac sodium (VOLTAREN) 1 % GEL Apply 2 g topically 4 (four) times daily as needed (pain.).   diphenhydrAMINE (BENADRYL) 25 MG tablet Take 25 mg by mouth every 6 (six) hours as needed (allergies.).   estradiol (ESTRACE) 0.1 MG/GM vaginal cream PLACE 1 APPLICATORFUL VAGINALLY 3 (THREE) TIMES A WEEK.   famotidine (PEPCID) 20 MG tablet Take by mouth.   latanoprost (XALATAN) 0.005 % ophthalmic solution 1 drop at bedtime.   levothyroxine (SYNTHROID) 50 MCG tablet TAKE ONE TABLET BY MOUTH EVERY DAY  BEFORE BREAKFAST   montelukast (SINGULAIR) 10 MG tablet TAKE 1 TABLET BY MOUTH EVERY DAY IN THE EVENING   Multiple Vitamin (MULTIVITAMIN WITH MINERALS) TABS tablet Take 1 tablet by mouth daily.   NONFORMULARY OR COMPOUNDED ITEM Estriole 1mg /gm vaginal cream Apply 0.5gm vaginally daily   omeprazole (PRILOSEC) 40 MG capsule TAKE 1 CAPSULE (40 MG TOTAL) BY MOUTH 2 (TWO) TIMES DAILY BEFORE A MEAL.   pseudoephedrine (SUDAFED) 60 MG tablet Take 30 mg by mouth every morning.   Spacer/Aero-Holding Chambers (AEROCHAMBER MV) inhaler Use as instructed   vitamin E 180 MG (400 UNITS) capsule Take 400 Units by mouth daily.    No facility-administered medications prior to visit.     Objective    BP 135/75   Pulse 70   Ht 5\' 6"  (1.676 m)   Wt 134 lb (60.8 kg)   SpO2 97%   BMI 21.63 kg/m   Physical Exam Vitals and nursing note reviewed.  Constitutional:      General: She is not in acute distress.    Appearance: Normal appearance. She is normal weight. She is not ill-appearing, toxic-appearing or diaphoretic.  HENT:     Head: Normocephalic and atraumatic.  Cardiovascular:     Rate and Rhythm: Normal rate and regular rhythm.     Pulses: Normal pulses.     Heart sounds: Normal heart sounds. No murmur heard.    No friction rub. No gallop.  Pulmonary:     Effort: Pulmonary effort is  normal. No respiratory distress.     Breath sounds: Normal breath sounds. No stridor. No wheezing, rhonchi or rales.  Chest:     Chest wall: No tenderness.  Abdominal:     Comments: No GI complaints   Musculoskeletal:        General: No swelling, tenderness, deformity or signs of injury. Normal range of motion.     Right lower leg: No edema.     Left lower leg: No edema.  Skin:    General: Skin is warm and dry.     Capillary Refill: Capillary refill takes less than 2 seconds.     Coloration: Skin is not jaundiced or pale.     Findings: No bruising, erythema, lesion or rash.  Neurological:     General: No focal  deficit present.     Mental Status: She is alert and oriented to person, place, and time. Mental status is at baseline.     Cranial Nerves: No cranial nerve deficit.     Sensory: No sensory deficit.     Motor: No weakness.     Coordination: Coordination normal.  Psychiatric:        Mood and Affect: Mood is anxious. Affect is tearful.        Behavior: Behavior normal.        Thought Content: Thought content normal. Thought content does not include homicidal or suicidal ideation. Thought content does not include homicidal or suicidal plan.        Judgment: Judgment normal.     No results found for any visits on 09/15/23.  Assessment & Plan     Problem List Items Addressed This Visit       Digestive   Gastrointestinal candidiasis - Primary    C/o GERD, bloating  C/o stool incontinence r/t diarrhea  Ongoing yeast- oral, vaginal/genital concerns Reports spouse has yeast/bladder infection and has upcoming nephrectomy  Following candida (high) diet and low sugar, no lactose diet  Well appearing; no current GI complaints       Relevant Medications   fluconazole (DIFLUCAN) 200 MG tablet     Other   Adjustment disorder with anxiety    Ongoing patient and SO health issues; his treatment plan is ongoing Reports use of OTC Nyquil to assist sleep/anxiety at this time given caregiver burden and chronic caregiver fatigue  Will send in PRN low dose Xanax #30 r0 to assist with acute on chronic stress  Patient denies use of daily SSRI or SNRI at this time to assist adjustment disorder       Relevant Medications   ALPRAZolam (XANAX) 0.25 MG tablet   Return if symptoms worsen or fail to improve.     Leilani Merl, FNP, have reviewed all documentation for this visit. The documentation on 09/15/23 for the exam, diagnosis, procedures, and orders are all accurate and complete.  Jacky Kindle, FNP  [facility] [phone] (phone) [phone] (fax)  [facility]

## 2023-09-15 NOTE — Assessment & Plan Note (Signed)
C/o GERD, bloating  C/o stool incontinence r/t diarrhea  Ongoing yeast- oral, vaginal/genital concerns Reports spouse has yeast/bladder infection and has upcoming nephrectomy  Following candida (high) diet and low sugar, no lactose diet  Well appearing; no current GI complaints

## 2023-09-15 NOTE — Assessment & Plan Note (Signed)
Ongoing patient and SO health issues; his treatment plan is ongoing Reports use of OTC Nyquil to assist sleep/anxiety at this time given caregiver burden and chronic caregiver fatigue  Will send in PRN low dose Xanax #30 r0 to assist with acute on chronic stress  Patient denies use of daily SSRI or SNRI at this time to assist adjustment disorder

## 2023-09-20 ENCOUNTER — Other Ambulatory Visit: Payer: Self-pay | Admitting: Family Medicine

## 2023-09-20 DIAGNOSIS — E079 Disorder of thyroid, unspecified: Secondary | ICD-10-CM

## 2023-10-01 DIAGNOSIS — H401222 Low-tension glaucoma, left eye, moderate stage: Secondary | ICD-10-CM | POA: Diagnosis not present

## 2023-10-01 DIAGNOSIS — H401111 Primary open-angle glaucoma, right eye, mild stage: Secondary | ICD-10-CM | POA: Diagnosis not present

## 2023-11-03 ENCOUNTER — Telehealth: Payer: Self-pay | Admitting: Gastroenterology

## 2023-11-03 MED ORDER — OMEPRAZOLE 40 MG PO CPDR: 40.0000 mg | DELAYED_RELEASE_CAPSULE | Freq: Two times a day (BID) | ORAL | 0 refills | Status: DC

## 2023-11-03 NOTE — Addendum Note (Signed)
 Addended by: Radene Knee L on: 11/03/2023 10:58 AM   Modules accepted: Orders

## 2023-11-03 NOTE — Telephone Encounter (Signed)
 Patient is due for a appointment. Called and got patient schedule for 11/10/2023

## 2023-11-03 NOTE — Telephone Encounter (Signed)
 The patient left a voicemail for a refill for her medication (Prilosec) 40 MG. The needs her refill to go to her new pharmacy at [facility] on [address] [place], [place] [address].

## 2023-11-05 ENCOUNTER — Other Ambulatory Visit: Payer: Self-pay | Admitting: Family Medicine

## 2023-11-05 DIAGNOSIS — N952 Postmenopausal atrophic vaginitis: Secondary | ICD-10-CM

## 2023-11-05 NOTE — Telephone Encounter (Signed)
 Medication Refill -  Most Recent Primary Care Visit:  Provider: PAYNE, ELISE T  Department: [facility]  Visit Type: OFFICE VISIT  Date: 09/15/2023  Medication:   NONFORMULARY OR COMPOUNDED ITEM [[id number]]   Order Details Dose, Route, Frequency: As Directed  Dispense Quantity: 30 each Refills: 12        Sig: Estriole 1mg /gm vaginal cream Apply 0.5gm vaginally daily    Has the patient contacted their pharmacy? Yes   Is this the correct pharmacy for this prescription? Yes  This is the patient's preferred pharmacy:   [facility] [address] [place] [address]  PHONE [phone] Elenore Griffon [phone]  Has the prescription been filled recently? Yes  Is the patient out of the medication? Yes  Has the patient been seen for an appointment in the last year OR does the patient have an upcoming appointment? Yes  Can we respond through MyChart? Yes  Agent: Please be advised that Rx refills may take up to 3 business days. We ask that you follow-up with your pharmacy.

## 2023-11-06 NOTE — Telephone Encounter (Signed)
Requested medication (s) are due for refill today:   Not sure  Requested medication (s) are on the active medication list:   Yes from 2022  Future visit scheduled:   No   LOV 09/15/2023 with Merita Norton, FNP   Last ordered: 2022  Unable to refill.   No protocol or information with this request.   Compounded medication.     Requested Prescriptions  Pending Prescriptions Disp Refills   NONFORMULARY OR COMPOUNDED ITEM 30 each 12    Sig: Estriole 1mg /gm vaginal cream Apply 0.5gm vaginally daily     There is no refill protocol information for this order

## 2023-11-07 ENCOUNTER — Telehealth: Payer: Self-pay | Admitting: Family Medicine

## 2023-11-07 NOTE — Telephone Encounter (Signed)
Patient called stated she needs the refill for Estriole 1mg /gm vaginal cream expedited as she really needs the med.

## 2023-11-09 MED ORDER — NONFORMULARY OR COMPOUNDED ITEM: 12 refills | Status: DC

## 2023-11-09 NOTE — Addendum Note (Signed)
Addended by: Malva Limes on: 11/09/2023 09:18 AM   Modules accepted: Orders

## 2023-11-10 ENCOUNTER — Ambulatory Visit (INDEPENDENT_AMBULATORY_CARE_PROVIDER_SITE_OTHER): Payer: Medicare Other | Admitting: Gastroenterology

## 2023-11-10 ENCOUNTER — Encounter: Payer: Self-pay | Admitting: Gastroenterology

## 2023-11-10 ENCOUNTER — Other Ambulatory Visit: Payer: Self-pay | Admitting: Family Medicine

## 2023-11-10 VITALS — BP 111/64 | HR 74 | Temp 97.5°F | Ht 66.0 in | Wt 134.5 lb

## 2023-11-10 DIAGNOSIS — K589 Irritable bowel syndrome without diarrhea: Secondary | ICD-10-CM | POA: Diagnosis not present

## 2023-11-10 DIAGNOSIS — N952 Postmenopausal atrophic vaginitis: Secondary | ICD-10-CM

## 2023-11-10 DIAGNOSIS — K580 Irritable bowel syndrome with diarrhea: Secondary | ICD-10-CM

## 2023-11-10 MED ORDER — OMEPRAZOLE 40 MG PO CPDR: 40.0000 mg | DELAYED_RELEASE_CAPSULE | Freq: Two times a day (BID) | ORAL | 1 refills | Status: DC

## 2023-11-10 NOTE — Progress Notes (Signed)
Kimberly Repress, MD [address]  [address]  [place], [place] [address]  Main: [phone]  Fax: [phone]    Gastroenterology Consultation  Referring Provider:     Malva Limes, MD Primary Care Physician:  Malva Limes, MD Primary Gastroenterologist:  Dr. Marva Panda Reason for Consultation: Difficulty swallowing, atypical chest pain     HPI:   Kimberly Mercer is a 78 y.o. female referred by Dr. Malva Limes, MD  for consultation & management of irritable bowel syndrome.  Patient reports that she has about 30+ years history of irritable bowel syndrome with alternating episodes of constipation and diarrhea, abdominal bloating. Over the years, she has been maintaining gluten-free diet, lactose-free diet and low FODMAPs which have been helping her controlling IBS symptoms.  She also had Candida esophagitis, with several rounds of fluconazole.  Today, she has been asymptomatic and she is thrilled to share that she has been taking B12 supplement 500 mcg daily along with probiotic which has tremendously helped with her energy levels, improve muscle mass as well as appetite.  She has regained her weight.  She states that 2024 has been very challenging and stressful with her husband going through several hospitalizations, with heart issues, bleeding into bladder as well as diagnosis of renal cell carcinoma and open heart surgery.  She had flareup of esophagitis, required another course of fluconazole.  She is currently taking omeprazole or Pepcid as needed only and requesting refill.  She does not have any concerns today  NSAIDs: occasional ibuprofen use  Antiplts/Anticoagulants/Anti thrombotics: none  GI Procedures:  Upper endoscopy 11/14/2020 - Normal duodenal bulb and second portion of the duodenum. Biopsied. - Normal stomach. Biopsied. - Esophagogastric landmarks identified. - White nummular lesions in esophageal mucosa. Cells for cytology obtained. Biopsied. YEAST WITH  PSEUDOHYPHAE   DIAGNOSIS:  A. DUODENUM; COLD BIOPSY:  - UNREMARKABLE DUODENAL MUCOSA.  - NEGATIVE FOR DYSPLASIA AND MALIGNANCY.   B.  STOMACH, RANDOM; COLD BIOPSY:  - GASTRIC MUCOSA WITH CHANGES CONSISTENT WITH PROTON PUMP INHIBITOR  EFFECT.  - NEGATIVE FOR H. PYLORI, DYSPLASIA, AND MALIGNANCY.   C.  ESOPHAGUS, RANDOM; COLD BIOPSY:  - ESOPHAGEAL CANDIDIASIS.  - NEGATIVE FOR DYSPLASIA AND MALIGNANCY.   EGD and colonoscopy by Dr. Norma Fredrickson in 11/2017    Colonoscopy 12/18/2017    Colonoscopy in 09/2009 Diverticulosis and internal hemorrhoids  Past Medical History:  Diagnosis Date   Allergic rhinitis, cause unspecified    Allergy    Asthma    Benign neoplasm of colon    Cancer (HCC)    Candidiasis of the esophagus    Chicken pox    Diverticulitis of colon (without mention of hemorrhage)(562.11)    E coli enteritis    GERD (gastroesophageal reflux disease)    Hemorrhoids    internal   Measles    Osteoarthrosis, unspecified whether generalized or localized, unspecified site    Symptomatic menopausal or female climacteric states    Unspecified asthma(493.90)    Unspecified hypothyroidism     Past Surgical History:  Procedure Laterality Date   ABDOMINAL HYSTERECTOMY     cardiac catherization  12/09/2008   normal coranaries,normal EF.   CARDIAC CATHETERIZATION     dilatation and curettage     due to SAB x 2   DILATION AND CURETTAGE OF UTERUS     ESOPHAGOGASTRODUODENOSCOPY (EGD) WITH PROPOFOL N/A 11/14/2020   Procedure: ESOPHAGOGASTRODUODENOSCOPY (EGD) WITH PROPOFOL;  Surgeon: Toney Reil, MD;  Location: [facility];  Service:  Gastroenterology;  Laterality: N/A;   LEFT HEART CATH AND CORONARY ANGIOGRAPHY N/A 08/25/2020   Procedure: LEFT HEART CATH AND CORONARY ANGIOGRAPHY;  Surgeon: Alwyn Pea, MD;  Location: [facility] INVASIVE CV LAB;  Service: Cardiovascular;  Laterality: N/A;   thyroid nodule  1990   benign   TOTAL ABDOMINAL HYSTERECTOMY W/ BILATERAL  SALPINGOOPHORECTOMY  2003   complex endometrial hyperplasia, DUB, fibroids   WISDOM TOOTH EXTRACTION      Current Outpatient Medications:    acetaminophen (TYLENOL) 325 MG tablet, Take by mouth., Disp: , Rfl:    albuterol (PROVENTIL) (2.5 MG/3ML) 0.083% nebulizer solution, Take 3 mLs (2.5 mg total) by nebulization every 6 (six) hours as needed for wheezing or shortness of breath. Dx:J45.909, Disp: 75 mL, Rfl: 12   albuterol (VENTOLIN HFA) 108 (90 Base) MCG/ACT inhaler, TAKE 2 PUFFS BY MOUTH EVERY 6 HOURS AS NEEDED FOR WHEEZE OR SHORTNESS OF BREATH, Disp: 18 each, Rfl: 3   ALPRAZolam (XANAX) 0.25 MG tablet, Take 1 tablet (0.25 mg total) by mouth daily as needed for anxiety., Disp: 30 tablet, Rfl: 0   aspirin EC 81 MG tablet, Take 81 mg by mouth every evening. Swallow whole., Disp: , Rfl:    Calcium-Magnesium-Zinc (CAL-MAG-ZINC PO), Take 1 tablet by mouth daily., Disp: , Rfl:    CANNABIDIOL PO, Take 30 mg by mouth daily. Capsule, Disp: , Rfl:    cetirizine (ZYRTEC ALLERGY) 10 MG tablet, Take 1 tablet (10 mg total) by mouth daily., Disp: 30 tablet, Rfl: 6   cholecalciferol (VITAMIN D3) 25 MCG (1000 UNIT) tablet, Take 1,000 Units by mouth daily., Disp: , Rfl:    diclofenac sodium (VOLTAREN) 1 % GEL, Apply 2 g topically 4 (four) times daily as needed (pain.)., Disp: , Rfl:    diphenhydrAMINE (BENADRYL) 25 MG tablet, Take 25 mg by mouth every 6 (six) hours as needed (allergies.)., Disp: , Rfl:    estradiol (ESTRACE) 0.1 MG/GM vaginal cream, PLACE 1 APPLICATORFUL VAGINALLY 3 (THREE) TIMES A WEEK., Disp: 42.5 g, Rfl: 12   famotidine (PEPCID) 20 MG tablet, Take by mouth., Disp: , Rfl:    fluconazole (DIFLUCAN) 200 MG tablet, Take 1 tablet (200 mg total) by mouth every other day., Disp: 7 tablet, Rfl: 1   latanoprost (XALATAN) 0.005 % ophthalmic solution, 1 drop at bedtime., Disp: , Rfl:    levothyroxine (SYNTHROID) 50 MCG tablet, TAKE ONE TABLET BY MOUTH EVERY DAY BEFORE BREAKFAST, Disp: 90 tablet, Rfl:  0   montelukast (SINGULAIR) 10 MG tablet, TAKE 1 TABLET BY MOUTH EVERY DAY IN THE EVENING, Disp: 90 tablet, Rfl: 4   Multiple Vitamin (MULTIVITAMIN WITH MINERALS) TABS tablet, Take 1 tablet by mouth daily., Disp: , Rfl:    NONFORMULARY OR COMPOUNDED ITEM, Estriole 1mg /gm vaginal cream Apply 0.5gm vaginally daily, Disp: 30 each, Rfl: 12   pseudoephedrine (SUDAFED) 60 MG tablet, Take 30 mg by mouth every morning., Disp: , Rfl:    Spacer/Aero-Holding Chambers (AEROCHAMBER MV) inhaler, Use as instructed, Disp: 1 each, Rfl: 0   vitamin E 180 MG (400 UNITS) capsule, Take 400 Units by mouth daily. , Disp: , Rfl:    omeprazole (PRILOSEC) 40 MG capsule, Take 1 capsule (40 mg total) by mouth 2 (two) times daily before a meal., Disp: 60 capsule, Rfl: 1    Family History  Problem Relation Age of Onset   Heart disease Mother        CAD   Stroke Mother        x 2  Mental illness Mother        not diagnosed   Irritable bowel syndrome Mother    Heart disease Father        CAD   Parkinsonism Father    Cancer Sister        Breast   Breast cancer Sister    Cancer Other        Breast and Ovarian   Breast cancer Other    Heart disease Brother    Schizophrenia Sister    Lung disease Sister      Social History   Tobacco Use   Smoking status: Never   Smokeless tobacco: Never  Vaping Use   Vaping status: Never Used  Substance Use Topics   Alcohol use: No   Drug use: No    Allergies as of 11/10/2023 - Review Complete 11/10/2023  Allergen Reaction Noted   Claritin [loratadine] Other (See Comments) 07/04/2012   Fructose Diarrhea 03/12/2016   Gluten meal Other (See Comments) 09/23/2012   Ipratropium Nausea And Vomiting 08/20/2020   Lactose Other (See Comments) 03/28/2022   Levaquin [levofloxacin] Nausea Only 07/04/2012   Sulfa drugs cross reactors Hives 07/04/2012   Tilactase Other (See Comments) 02/23/2014    Review of Systems:    All systems reviewed and negative except where noted  in HPI.   Physical Exam:  BP 111/64 (BP Location: Left Arm, Patient Position: Sitting, Cuff Size: Normal)   Pulse 74   Temp (!) 97.5 F (36.4 C) (Oral)   Ht 5\' 6"  (1.676 m)   Wt 134 lb 8 oz (61 kg)   BMI 21.71 kg/m  No LMP recorded. Patient has had a hysterectomy.  General:   Alert,  Well-developed, well-nourished, pleasant and cooperative in NAD Head:  Normocephalic and atraumatic. Eyes:  Sclera clear, no icterus.   Conjunctiva pink. Ears:  Normal auditory acuity. Nose:  No deformity, discharge, or lesions. Mouth:  No deformity or lesions,oropharynx pink & moist. Neck:  Supple; no masses or thyromegaly. Lungs:  Respirations even and unlabored.  Clear throughout to auscultation.   No wheezes, crackles, or rhonchi. No acute distress. Heart:  Regular rate and rhythm; no murmurs, clicks, rubs, or gallops. Abdomen:  Normal bowel sounds. Soft, non-tender and non-distended without masses, hepatosplenomegaly or hernias noted.  No guarding or rebound tenderness.   Rectal: Not performed Msk:  Symmetrical, mild redness and deformities in interphalangeal joints of bilateral hands. Good, equal movement & strength bilaterally. Pulses:  Normal pulses noted. Extremities:  No clubbing or edema.  No cyanosis. Neurologic:  Alert and oriented x3;  grossly normal neurologically. Skin:  Intact without significant lesions or rashes. No jaundice. Psych:  Alert and cooperative. Normal mood and affect.  Imaging Studies: Abdominal imaging reviewed  Assessment and Plan:   ARWILDA ZELMER is a 78 y.o. Caucasian female with hypothyroidism, on levothyroxine, chronic GERD, history of Candida esophagitis status posttreatment with fluconazole.  History of IBS symptoms which are currently under control.  She had temporary weight loss in setting of stress and flareup of IBS.  Currently taking B12 supplement as well as probiotic, keeping her symptoms under control Refill omeprazole  Follow up as needed and  contact via MyChart   Kimberly Repress, MD

## 2023-11-11 ENCOUNTER — Other Ambulatory Visit: Payer: Self-pay | Admitting: Family Medicine

## 2023-11-11 ENCOUNTER — Telehealth: Payer: Self-pay | Admitting: Family Medicine

## 2023-11-11 DIAGNOSIS — N952 Postmenopausal atrophic vaginitis: Secondary | ICD-10-CM

## 2023-11-11 DIAGNOSIS — J45909 Unspecified asthma, uncomplicated: Secondary | ICD-10-CM

## 2023-11-11 NOTE — Telephone Encounter (Addendum)
Caller very upset stating [facility] has not received NONFORMULARY OR COMPOUNDED ITEM . She states this happens every time with this medication and would like a follow up call confirming that it has been sent in again. Pharmacy confirmed they did not receive the script requesting a verbal. See Telephone encounter for 11/05/2023 confirming it was faxed.    [facility] - [place], [place] - [address] Phone: [phone]  Fax: [phone]

## 2023-11-12 ENCOUNTER — Telehealth: Payer: Self-pay

## 2023-11-12 NOTE — Telephone Encounter (Signed)
Confirmed with pharmacy and prescription is ready for pick up.

## 2023-11-12 NOTE — Telephone Encounter (Signed)
Copied from CRM [id number]. Topic: Complaint (DO NOT CONVERT) - Care >> Nov 11, 2023 10:04 AM Tiffany B wrote: Patient is very upset and states she takes care of a spouse who has cancer and does not want to deal with a prescription that should have been sent in to the pharmacy. Patient has called numerous of times and would like a follow up call today. Caller states if this is not rectified today she will find a new provider. Please reference 11/11/2023 medication refill encounter.   Research officer, political party; Marland Kitchen

## 2023-11-12 NOTE — Telephone Encounter (Signed)
Copied from CRM [id number]. Topic: General - Other >> Nov 11, 2023 10:44 AM Lynford Humphrey wrote: Reason for CRM:( This is duplicate of telephone encounter that was taken by [facility]) Caller very upset stating [facility] has not received NONFORMULARY OR COMPOUNDED ITEM . She states this happens every time with this medication and would like a follow up call confirming that it has been sent in again. Pharmacy confirmed they did not receive the script requesting a verbal. See Telephone encounter for 11/05/2023 confirming it was faxed.      [facility] - [place], [place] - [address]  Phone: [phone] Fax: [phone]

## 2023-11-13 MED ORDER — NONFORMULARY OR COMPOUNDED ITEM: 12 refills | Status: AC

## 2023-11-13 MED ORDER — NONFORMULARY OR COMPOUNDED ITEM: 12 refills | Status: DC

## 2023-11-13 NOTE — Telephone Encounter (Signed)
Copied from CRM [id number]. Topic: Complaint (DO NOT CONVERT) - Care >> Nov 11, 2023 10:04 AM Tiffany B wrote: Patient is very upset and states she takes care of a spouse who has cancer and does not want to deal with a prescription that should have been sent in to the pharmacy. Patient has called numerous of times and would like a follow up call today. Caller states if this is not rectified today she will find a new provider. Please reference 11/11/2023 medication refill encounter.   Research officer, political party; Marland Kitchen

## 2023-11-13 NOTE — Telephone Encounter (Signed)
Called pharmacy who states patient picked up the compounded prescription yesterday.

## 2023-11-20 ENCOUNTER — Other Ambulatory Visit: Payer: Self-pay | Admitting: Family Medicine

## 2023-11-20 DIAGNOSIS — Z1231 Encounter for screening mammogram for malignant neoplasm of breast: Secondary | ICD-10-CM

## 2023-12-18 ENCOUNTER — Ambulatory Visit
Admission: RE | Admit: 2023-12-18 | Discharge: 2023-12-18 | Disposition: A | Discharge status: Home / Self Care | Payer: Medicare Other | Source: Ambulatory Visit | Attending: Family Medicine | Admitting: Family Medicine

## 2023-12-18 DIAGNOSIS — Z1231 Encounter for screening mammogram for malignant neoplasm of breast: Secondary | ICD-10-CM | POA: Diagnosis not present

## 2023-12-24 ENCOUNTER — Other Ambulatory Visit: Payer: Self-pay | Admitting: Family Medicine

## 2023-12-24 DIAGNOSIS — E079 Disorder of thyroid, unspecified: Secondary | ICD-10-CM

## 2023-12-31 ENCOUNTER — Other Ambulatory Visit: Payer: Self-pay | Admitting: Family Medicine

## 2024-01-01 ENCOUNTER — Ambulatory Visit: Payer: Self-pay | Admitting: Family Medicine

## 2024-01-01 ENCOUNTER — Ambulatory Visit (INDEPENDENT_AMBULATORY_CARE_PROVIDER_SITE_OTHER): Admitting: Nurse Practitioner

## 2024-01-01 ENCOUNTER — Encounter: Payer: Self-pay | Admitting: Nurse Practitioner

## 2024-01-01 VITALS — BP 116/64 | HR 74 | Temp 97.4°F | Resp 16 | Ht 66.0 in | Wt 135.6 lb

## 2024-01-01 DIAGNOSIS — R300 Dysuria: Secondary | ICD-10-CM

## 2024-01-01 LAB — POCT URINALYSIS DIPSTICK
Bilirubin, UA: NEGATIVE
Blood, UA: NEGATIVE
Glucose, UA: NEGATIVE
Ketones, UA: POSITIVE
Leukocytes, UA: NEGATIVE
Nitrite, UA: NEGATIVE
Odor: NORMAL
Protein, UA: NEGATIVE
Spec Grav, UA: 1.025 (ref 1.010–1.025)
Urobilinogen, UA: 0.2 U/dL
pH, UA: 6.5 (ref 5.0–8.0)

## 2024-01-01 MED ORDER — FLUCONAZOLE 150 MG PO TABS: 150.0000 mg | ORAL_TABLET | ORAL | 0 refills | Status: DC | PRN

## 2024-01-01 MED ORDER — CEPHALEXIN 500 MG PO CAPS: 500.0000 mg | ORAL_CAPSULE | Freq: Two times a day (BID) | ORAL | 0 refills | Status: AC

## 2024-01-01 NOTE — Progress Notes (Signed)
 BP 116/64   Pulse 74   Temp (!) 97.4 F (36.3 C)   Resp 16   Ht 5\' 6"  (1.676 m)   Wt 135 lb 9.6 oz (61.5 kg)   SpO2 97%   BMI 21.89 kg/m    Subjective:    Patient ID: Kimberly Mercer, female    DOB: 1946-06-06, 78 y.o.   MRN: [id number]  HPI: Kimberly Mercer is a 78 y.o. female  Chief Complaint  Patient presents with   Urinary Tract Infection    Low abdominal pain, low back pain and burning    Discussed the use of AI scribe software for clinical note transcription with the patient, who gave verbal consent to proceed.  History of Present Illness   The patient is a 78 year old with degenerative disc disease who presents with dysuria, low back pain, and lower abdominal pain.  She has been experiencing dysuria, low back pain, and lower abdominal pain for the past three days. The dysuria is described as a burning sensation that improves with increased water intake but recurs after bladder emptying. No fever or vaginal discharge is present. Her current symptoms are severe enough to have awakened her at night with a crampy, achy pain in the lower abdomen.  She has a history of frequent cystitis approximately forty years ago. However, she has not had a UTI in a very long time.  She has degenerative disc disease, which complicates her ability to distinguish the source of her back pain. The pain is localized to the lower back and is described as crampy and achy.  She is allergic to sulfa drugs and is lactose intolerant, which prevents her from consuming yogurt. She takes a probiotic supplement instead. She has a history of severe gastrointestinal yeast infections, which were previously attributed to corticosteroid use for asthma.       09/15/2023    8:39 AM 03/28/2023   11:34 AM 01/29/2023    1:09 PM  Depression screen PHQ 2/9  Decreased Interest 0 0 0  Down, Depressed, Hopeless 0 0 0  PHQ - 2 Score 0 0 0  Altered sleeping 0 0   Tired, decreased energy 0 0   Change in appetite 0 0    Feeling bad or failure about yourself  0 0   Trouble concentrating 0 0   Moving slowly or fidgety/restless 0 0   Suicidal thoughts 0 0   PHQ-9 Score 0 0   Difficult doing work/chores Not difficult at all      Relevant past medical, surgical, family and social history reviewed and updated as indicated. Interim medical history since our last visit reviewed. Allergies and medications reviewed and updated.  Review of Systems  Ten systems reviewed and is negative except as mentioned in HPI      Objective:    BP 116/64   Pulse 74   Temp (!) 97.4 F (36.3 C)   Resp 16   Ht 5\' 6"  (1.676 m)   Wt 135 lb 9.6 oz (61.5 kg)   SpO2 97%   BMI 21.89 kg/m    Wt Readings from Last 3 Encounters:  01/01/24 135 lb 9.6 oz (61.5 kg)  11/10/23 134 lb 8 oz (61 kg)  09/15/23 134 lb (60.8 kg)    Physical Exam Vitals reviewed.  Constitutional:      Appearance: Normal appearance.  HENT:     Head: Normocephalic.  Cardiovascular:     Rate and Rhythm: Normal rate.  Pulmonary:  Effort: Pulmonary effort is normal.  Abdominal:     Tenderness: There is no abdominal tenderness. There is no right CVA tenderness or left CVA tenderness.  Neurological:     General: No focal deficit present.     Mental Status: She is alert and oriented to person, place, and time. Mental status is at baseline.  Psychiatric:        Mood and Affect: Mood normal.        Behavior: Behavior normal.        Thought Content: Thought content normal.        Judgment: Judgment normal.     Results for orders placed or performed in visit on 01/01/24  POCT Urinalysis Dipstick   Collection Time: 01/01/24  2:10 PM  Result Value Ref Range   Color, UA yellow    Clarity, UA clear    Glucose, UA Negative Negative   Bilirubin, UA neg    Ketones, UA positive    Spec Grav, UA 1.025 1.010 - 1.025   Blood, UA neg    pH, UA 6.5 5.0 - 8.0   Protein, UA Negative Negative   Urobilinogen, UA 0.2 0.2 or 1.0 E.U./dL   Nitrite, UA  neg    Leukocytes, UA Negative Negative   Appearance clear    Odor normal        Assessment & Plan:   Problem List Items Addressed This Visit   None Visit Diagnoses       Burning with urination    -  Primary   Relevant Medications   cephALEXin (KEFLEX) 500 MG capsule   fluconazole (DIFLUCAN) 150 MG tablet   Other Relevant Orders   POCT Urinalysis Dipstick (Completed)   Urine Culture        Assessment and Plan    Dysuria and lower abdominal pain Presents with dysuria, low back pain, and lower abdominal pain for three days. No fever or vaginal discharge. Urinalysis negative for leukocytes and nitrates, but ketones present. Despite negative urinalysis, UTI remains a possibility based on symptoms. History of frequent cystitis, though it has been many years since the last episode. Informed consent provided for starting Keflex, with instructions to discontinue if culture is negative. Discussed potential side effects of Keflex, including diarrhea and stomach upset. Due to history of yeast infections, Diflucan prescribed as a precaution. - Send urine for culture - Prescribe Keflex for possible UTI - Instruct to stop antibiotic if culture is negative - Discuss potential side effects of Keflex, including diarrhea and stomach upset - Prescribe Diflucan to be used if yeast infection develops  Follow-up Will be contacted with urine culture results by Monday. If symptoms persist or worsen, further evaluation may be necessary. - Contact with urine culture results on Monday, if culture negative, consider labs and urology consult - Advise to follow up with PCP if no improvement        Follow up plan: Return for follow up with pcp.

## 2024-01-01 NOTE — Telephone Encounter (Signed)
 Called Kimberly Mercer and spoke with Okey Regal and informed pt wanted to be worked into the schedule.

## 2024-01-01 NOTE — Telephone Encounter (Signed)
 Copied from CRM [id number]. Topic: Clinical - Red Word Triage >> Jan 01, 2024  1:06 PM Kimberly Mercer wrote: Red Word that prompted transfer to Nurse Triage: possible UTI or bladder infection- burning after urination, lower abdominal pain  Chief Complaint: urination pain Symptoms: urinary pain/discomfort, urgency, frequency,  Frequency: x 3 days Pertinent Negatives: Patient denies fever, bloody urine Disposition: [] ED /[] Urgent Care (no appt availability in office) / [x] Appointment(In office/virtual)/ []  [facility]/ [] Home Care/ [] Refused Recommended Disposition /[] [facility]/ []  Follow-up with PCP Additional Notes: pt would like in office appt: no appts available with PCP of in PCP office: will call office to see if pt could be worked into schedule.  Answer Assessment - Initial Assessment Questions 1. SEVERITY: "How bad is the pain?"  (e.g., Scale 1-10; mild, moderate, or severe)   - MILD (1-3): complains slightly about urination hurting   - MODERATE (4-7): interferes with normal activities     - SEVERE (8-10): excruciating, unwilling or unable to urinate because of the pain      Mild to moderate 2. FREQUENCY: "How many times have you had painful urination today?"      several 3. PATTERN: "Is pain present every time you urinate or just sometimes?"      yes 4. ONSET: "When did the painful urination start?"     X 3 days 5. FEVER: "Do you have a fever?" If Yes, ask: "What is your temperature, how was it measured, and when did it start?"     no 6. PAST UTI: "Have you had a urine infection before?" If Yes, ask: "When was the last time?" and "What happened that time?"      yes 7. CAUSE: "What do you think is causing the painful urination?"  (e.g., UTI, scratch, Herpes sore)     UTI 8. OTHER SYMPTOMS: "Do you have any other symptoms?" (e.g., blood in urine, flank pain, genital sores, urgency, vaginal discharge)     No - abd cramping 9. PREGNANCY: "Is there any chance you  are pregnant?" "When was your last menstrual period?"     N/a  Protocols used: Urination Pain - Female-A-AH

## 2024-01-02 LAB — URINE CULTURE
MICRO NUMBER:: 16198490
Result:: NO GROWTH
SPECIMEN QUALITY:: ADEQUATE

## 2024-01-05 ENCOUNTER — Ambulatory Visit (INDEPENDENT_AMBULATORY_CARE_PROVIDER_SITE_OTHER): Admitting: Family Medicine

## 2024-01-05 ENCOUNTER — Encounter: Payer: Self-pay | Admitting: Family Medicine

## 2024-01-05 VITALS — BP 124/65 | HR 61 | Resp 16 | Ht 67.0 in | Wt 133.0 lb

## 2024-01-05 DIAGNOSIS — R739 Hyperglycemia, unspecified: Secondary | ICD-10-CM

## 2024-01-05 DIAGNOSIS — E079 Disorder of thyroid, unspecified: Secondary | ICD-10-CM

## 2024-01-05 DIAGNOSIS — R824 Acetonuria: Secondary | ICD-10-CM

## 2024-01-05 DIAGNOSIS — R5383 Other fatigue: Secondary | ICD-10-CM

## 2024-01-05 LAB — POCT GLYCOSYLATED HEMOGLOBIN (HGB A1C)
Est. average glucose Bld gHb Est-mCnc: 105
Hemoglobin A1C: 5.3 % (ref 4.0–5.6)

## 2024-01-05 NOTE — Progress Notes (Signed)
 Established patient visit   Patient: Kimberly Mercer   DOB: 28-Feb-1946   78 y.o. Female  MRN: [id number] Visit Date: 01/05/2024  Today's healthcare provider: Mila Merry, MD   Chief Complaint  Patient presents with   Elevated Sugar Levels    Patient reports having elevated sugar levels. Reports this morning it was 190 and at noon 119   Subjective    Ambient AI software was used to assist with clinical note transcription.   History of Present Illness   Kimberly Mercer "[name]" is a 78 year old female who presents with elevated blood sugar levels and fatigue.  She has experienced elevated blood sugar levels over the past weekend, with readings of 118 and 119, but a spike to 190 this morning, checked with he husband's glucometer. She felt 'awful' after returning from tending to horses, despite only having a rice cake with peanut butter earlier. After eating breakfast, her blood sugar was 119 at lunchtime. She is unsure of the cause of these fluctuations.  A recent urinalysis showed the presence of ketones, although the specific amount was not provided. She initially suspected a urinary tract infection, but cultures were negative, and she did not take the prescribed Keflex as her symptoms resolved by Saturday morning after increasing her water intake.  She describes experiencing fatigue and low energy levels for the past year and a half, which have not improved significantly. She also recalls a period of excessive thirst, during which she was drinking gallons of water daily, although this symptom has not been consistent.  She has been monitoring her blood sugar intermittently, with readings varying from 98 to 190, and expresses concern about these irregularities.       Medications: Outpatient Medications Prior to Visit  Medication Sig   acetaminophen (TYLENOL) 325 MG tablet Take by mouth.   albuterol (PROVENTIL) (2.5 MG/3ML) 0.083% nebulizer solution Take 3 mLs (2.5 mg total)  by nebulization every 6 (six) hours as needed for wheezing or shortness of breath. 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   CANNABIDIOL PO Take 30 mg by mouth daily. Capsule   cholecalciferol (VITAMIN D3) 25 MCG (1000 UNIT) tablet Take 1,000 Units by mouth daily.   diclofenac sodium (VOLTAREN) 1 % GEL Apply 2 g topically 4 (four) times daily as needed (pain.).   diphenhydrAMINE (BENADRYL) 25 MG tablet Take 25 mg by mouth every 6 (six) hours as needed (allergies.).   famotidine (PEPCID) 20 MG tablet Take by mouth.   latanoprost (XALATAN) 0.005 % ophthalmic solution 1 drop at bedtime.   levothyroxine (SYNTHROID) 50 MCG tablet TAKE ONE TABLET BY MOUTH EVERY DAY BEFORE BREAKFAST   montelukast (SINGULAIR) 10 MG tablet TAKE ONE TABLET BY MOUTH EVERY EVENING   Multiple Vitamin (MULTIVITAMIN WITH MINERALS) TABS tablet Take 1 tablet by mouth daily.   NONFORMULARY OR COMPOUNDED ITEM Estriole 1mg /gm vaginal cream Apply 0.5gm vaginally daily   omeprazole (PRILOSEC) 40 MG capsule Take 1 capsule (40 mg total) by mouth 2 (two) times daily before a meal. (Patient taking differently: Take 40 mg by mouth 2 (two) times daily as needed.)   pseudoephedrine (SUDAFED) 60 MG tablet Take 30 mg by mouth every morning.   Spacer/Aero-Holding Chambers (AEROCHAMBER MV) inhaler Use as instructed   vitamin E 180 MG (400 UNITS)  capsule Take 400 Units by mouth daily.    cephALEXin (KEFLEX) 500 MG capsule Take 1 capsule (500 mg total) by mouth 2 (two) times daily for 5 days.   fluconazole (DIFLUCAN) 150 MG tablet Take 1 tablet (150 mg total) by mouth every 3 (three) days as needed (for vaginal itching/yeast infection sx).   fluconazole (DIFLUCAN) 200 MG tablet Take 1 tablet (200 mg total) by mouth every other day.   [DISCONTINUED] aspirin EC 81 MG  tablet Take 81 mg by mouth every evening. Swallow whole. (Patient not taking: Reported on 01/05/2024)   No facility-administered medications prior to visit.   Review of Systems     Objective    BP 124/65 (BP Location: Left Arm, Patient Position: Sitting, Cuff Size: Normal)   Pulse 61   Resp 16   Ht 5\' 7"  (1.702 m)   Wt 133 lb (60.3 kg)   SpO2 100%   BMI 20.83 kg/m   Physical Exam  General appearance: Well developed, well nourished female, cooperative and in no acute distress Head: Normocephalic, without obvious abnormality, atraumatic Respiratory: Respirations even and unlabored, normal respiratory rate Extremities: All extremities are intact.  Skin: Skin color, texture, turgor normal. No rashes seen  Psych: Appropriate mood and affect. Neurologic: Mental status: Alert, oriented to person, place, and time, thought content appropriate.     Results for orders placed or performed in visit on 01/05/24  POCT glycosylated hemoglobin (Hb A1C)  Result Value Ref Range   Hemoglobin A1C 5.3 4.0 - 5.6 %   Est. average glucose Bld gHb Est-mCnc 105     Assessment & Plan         Hyperglycemia with ketonuria Elevated glucose levels with postprandial peak of 190 mg/dL. Ketonuria noted, on u/a last week. Normal A1c.  - check CBC, met, C magnesium   Fatigue Persistent fatigue possibly related to glucose levels or another condition. - Labs as above  Hypothyroid Check T4/TSH        Mila Merry, MD  [facility] [phone] (phone) [phone] (fax)  [facility]

## 2024-01-05 NOTE — Patient Instructions (Signed)
 Please review the attached list of medications and notify my office if there are any errors.   You can go to any [facility] to get your blood drawn. There is one at [employer] on [address] across from [employer]. There is one on [address] behind [address], and one at  [facility] [employer] near the [address]

## 2024-01-06 ENCOUNTER — Encounter: Payer: Self-pay | Admitting: Nurse Practitioner

## 2024-01-06 DIAGNOSIS — R824 Acetonuria: Secondary | ICD-10-CM | POA: Diagnosis not present

## 2024-01-06 DIAGNOSIS — R5383 Other fatigue: Secondary | ICD-10-CM | POA: Diagnosis not present

## 2024-01-06 DIAGNOSIS — R739 Hyperglycemia, unspecified: Secondary | ICD-10-CM | POA: Diagnosis not present

## 2024-01-06 DIAGNOSIS — E079 Disorder of thyroid, unspecified: Secondary | ICD-10-CM | POA: Diagnosis not present

## 2024-01-07 ENCOUNTER — Encounter: Payer: Self-pay | Admitting: Family Medicine

## 2024-01-07 LAB — COMPREHENSIVE METABOLIC PANEL
ALT: 20 IU/L (ref 0–32)
AST: 27 IU/L (ref 0–40)
Albumin: 4.4 g/dL (ref 3.8–4.8)
Alkaline Phosphatase: 74 IU/L (ref 44–121)
BUN/Creatinine Ratio: 33 — ABNORMAL HIGH (ref 12–28)
BUN: 23 mg/dL (ref 8–27)
Bilirubin Total: 0.2 mg/dL (ref 0.0–1.2)
CO2: 23 mmol/L (ref 20–29)
Calcium: 9.5 mg/dL (ref 8.7–10.3)
Chloride: 104 mmol/L (ref 96–106)
Creatinine, Ser: 0.7 mg/dL (ref 0.57–1.00)
Globulin, Total: 1.9 g/dL (ref 1.5–4.5)
Glucose: 119 mg/dL — ABNORMAL HIGH (ref 70–99)
Potassium: 3.9 mmol/L (ref 3.5–5.2)
Sodium: 143 mmol/L (ref 134–144)
Total Protein: 6.3 g/dL (ref 6.0–8.5)
eGFR: 90 mL/min/{1.73_m2} (ref >59)

## 2024-01-07 LAB — CBC WITH DIFFERENTIAL/PLATELET
Basophils Absolute: 0 10*3/uL (ref 0.0–0.2)
Basos: 1 %
EOS (ABSOLUTE): 0.1 10*3/uL (ref 0.0–0.4)
Eos: 1 %
Hematocrit: 40.7 % (ref 34.0–46.6)
Hemoglobin: 14.1 g/dL (ref 11.1–15.9)
Immature Grans (Abs): 0 10*3/uL (ref 0.0–0.1)
Immature Granulocytes: 0 %
Lymphocytes Absolute: 1.1 10*3/uL (ref 0.7–3.1)
Lymphs: 28 %
MCH: 32.6 pg (ref 26.6–33.0)
MCHC: 34.6 g/dL (ref 31.5–35.7)
MCV: 94 fL (ref 79–97)
Monocytes Absolute: 0.4 10*3/uL (ref 0.1–0.9)
Monocytes: 11 %
Neutrophils Absolute: 2.3 10*3/uL (ref 1.4–7.0)
Neutrophils: 59 %
Platelets: 259 10*3/uL (ref 150–450)
RBC: 4.32 x10E6/uL (ref 3.77–5.28)
RDW: 11.8 % (ref 11.7–15.4)
WBC: 3.9 10*3/uL (ref 3.4–10.8)

## 2024-01-07 LAB — TSH: TSH: 0.968 u[IU]/mL (ref 0.450–4.500)

## 2024-01-07 LAB — T4, FREE: Free T4: 1.23 ng/dL (ref 0.82–1.77)

## 2024-01-07 LAB — MAGNESIUM: Magnesium: 2.2 mg/dL (ref 1.6–2.3)

## 2024-01-23 DIAGNOSIS — J4520 Mild intermittent asthma, uncomplicated: Secondary | ICD-10-CM | POA: Diagnosis not present

## 2024-01-23 DIAGNOSIS — K219 Gastro-esophageal reflux disease without esophagitis: Secondary | ICD-10-CM | POA: Diagnosis not present

## 2024-01-23 DIAGNOSIS — R002 Palpitations: Secondary | ICD-10-CM | POA: Diagnosis not present

## 2024-01-23 DIAGNOSIS — R000 Tachycardia, unspecified: Secondary | ICD-10-CM | POA: Diagnosis not present

## 2024-01-23 DIAGNOSIS — R0602 Shortness of breath: Secondary | ICD-10-CM | POA: Diagnosis not present

## 2024-01-23 DIAGNOSIS — R251 Tremor, unspecified: Secondary | ICD-10-CM | POA: Diagnosis not present

## 2024-02-04 DIAGNOSIS — H401222 Low-tension glaucoma, left eye, moderate stage: Secondary | ICD-10-CM | POA: Diagnosis not present

## 2024-02-04 DIAGNOSIS — H2512 Age-related nuclear cataract, left eye: Secondary | ICD-10-CM | POA: Diagnosis not present

## 2024-02-04 DIAGNOSIS — H401211 Low-tension glaucoma, right eye, mild stage: Secondary | ICD-10-CM | POA: Diagnosis not present

## 2024-02-11 ENCOUNTER — Ambulatory Visit (INDEPENDENT_AMBULATORY_CARE_PROVIDER_SITE_OTHER): Payer: Self-pay

## 2024-02-11 DIAGNOSIS — Z0000 Encounter for general adult medical examination without abnormal findings: Secondary | ICD-10-CM

## 2024-02-11 NOTE — Progress Notes (Signed)
 Subjective:   Elinora Z Baccari is a 78 y.o. who presents for a Medicare Wellness preventive visit.  Visit Complete: Virtual I connected with  Lylia Sand on 02/11/24 by a audio enabled telemedicine application and verified that I am speaking with the correct person using two identifiers.  Patient Location: Home  Provider Location: Home Office  I discussed the limitations of evaluation and management by telemedicine. The patient expressed understanding and agreed to proceed.  Vital Signs: Because this visit was a virtual/telehealth visit, some criteria may be missing or patient reported. Any vitals not documented were not able to be obtained and vitals that have been documented are patient reported.  VideoDeclined- This patient declined Librarian, academic. Therefore the visit was completed with audio only.  Persons Participating in Visit: Patient.  AWV Questionnaire: No: Patient Medicare AWV questionnaire was not completed prior to this visit.  Cardiac Risk Factors include: advanced age (>46men, >66 women)     Objective:    There were no vitals filed for this visit. There is no height or weight on file to calculate BMI.     02/11/2024    9:38 AM 01/29/2023    1:11 PM 01/23/2022    3:10 PM 12/07/2020    8:21 AM 11/14/2020    7:52 AM 08/22/2020    2:49 PM 07/14/2019    2:29 PM  Advanced Directives  Does Patient Have a Medical Advance Directive? No No No No No No No  Would patient like information on creating a medical advance directive? No - Patient declined  No - Patient declined No - Patient declined       Current Medications (verified) Outpatient Encounter Medications as of 02/11/2024  Medication Sig   acetaminophen  (TYLENOL ) 325 MG tablet Take by mouth.   albuterol  (PROVENTIL ) (2.5 MG/3ML) 0.083% nebulizer solution Take 3 mLs (2.5 mg total) by nebulization every 6 (six) hours as needed for wheezing or shortness of breath. Dx:J45.909   albuterol   (VENTOLIN  HFA) 108 (90 Base) MCG/ACT inhaler INHALE 2 PUFFS BY MOUTH EVERY 6 HOURS AS NEEDED FOR WHEEZING OR SHORTNESS OF BREATH   Calcium -Magnesium-Zinc (CAL-MAG-ZINC PO) Take 1 tablet by mouth daily.   CANNABIDIOL PO Take 30 mg by mouth daily. Capsule   cholecalciferol  (VITAMIN D3) 25 MCG (1000 UNIT) tablet Take 1,000 Units by mouth daily.   diclofenac sodium (VOLTAREN) 1 % GEL Apply 2 g topically 4 (four) times daily as needed (pain.).   diphenhydrAMINE (BENADRYL) 25 MG tablet Take 25 mg by mouth every 6 (six) hours as needed (allergies.).   famotidine  (PEPCID ) 20 MG tablet Take by mouth.   fluconazole  (DIFLUCAN ) 150 MG tablet Take 1 tablet (150 mg total) by mouth every 3 (three) days as needed (for vaginal itching/yeast infection sx).   fluconazole  (DIFLUCAN ) 200 MG tablet Take 1 tablet (200 mg total) by mouth every other day.   latanoprost (XALATAN) 0.005 % ophthalmic solution 1 drop at bedtime.   levothyroxine  (SYNTHROID ) 50 MCG tablet TAKE ONE TABLET BY MOUTH EVERY DAY BEFORE BREAKFAST   montelukast (SINGULAIR) 10 MG tablet TAKE ONE TABLET BY MOUTH EVERY EVENING   Multiple Vitamin (MULTIVITAMIN WITH MINERALS) TABS tablet Take 1 tablet by mouth daily.   NONFORMULARY OR COMPOUNDED ITEM Estriole 1mg /gm vaginal cream Apply 0.5gm vaginally daily   omeprazole  (PRILOSEC) 40 MG capsule Take 1 capsule (40 mg total) by mouth 2 (two) times daily before a meal. (Patient taking differently: Take 40 mg by mouth 2 (two) times daily  as needed.)   pseudoephedrine (SUDAFED) 60 MG tablet Take 30 mg by mouth every morning.   Spacer/Aero-Holding Chambers (AEROCHAMBER MV) inhaler Use as instructed   vitamin E 180 MG (400 UNITS) capsule Take 400 Units by mouth daily.    No facility-administered encounter medications on file as of 02/11/2024.    Allergies (verified) Claritin [loratadine], Fructose, Gluten meal, Ipratropium, Lactose, Levaquin [levofloxacin], Sulfa drugs cross reactors, and Tilactase    History: Past Medical History:  Diagnosis Date   Allergic rhinitis, cause unspecified    Allergy     Asthma    Benign neoplasm of colon    Cancer (HCC)    Candidiasis of the esophagus    Chicken pox    Diverticulitis of colon (without mention of hemorrhage)(562.11)    E coli enteritis    GERD (gastroesophageal reflux disease)    Hemorrhoids    internal   Measles    Osteoarthrosis, unspecified whether generalized or localized, unspecified site    Symptomatic menopausal or female climacteric states    Unspecified asthma(493.90)    Unspecified hypothyroidism    Past Surgical History:  Procedure Laterality Date   ABDOMINAL HYSTERECTOMY     cardiac catherization  12/09/2008   normal coranaries,normal EF.   CARDIAC CATHETERIZATION     dilatation and curettage     due to SAB x 2   DILATION AND CURETTAGE OF UTERUS     ESOPHAGOGASTRODUODENOSCOPY (EGD) WITH PROPOFOL  N/A 11/14/2020   Procedure: ESOPHAGOGASTRODUODENOSCOPY (EGD) WITH PROPOFOL ;  Surgeon: Selena Daily, MD;  Location: [facility] ENDOSCOPY;  Service: Gastroenterology;  Laterality: N/A;   LEFT HEART CATH AND CORONARY ANGIOGRAPHY N/A 08/25/2020   Procedure: LEFT HEART CATH AND CORONARY ANGIOGRAPHY;  Surgeon: Antonette Batters, MD;  Location: [facility] INVASIVE CV LAB;  Service: Cardiovascular;  Laterality: N/A;   thyroid  nodule  1990   benign   TOTAL ABDOMINAL HYSTERECTOMY W/ BILATERAL SALPINGOOPHORECTOMY  2003   complex endometrial hyperplasia, DUB, fibroids   WISDOM TOOTH EXTRACTION     Family History  Problem Relation Age of Onset   Heart disease Mother        CAD   Stroke Mother        x 2   Mental illness Mother        not diagnosed   Irritable bowel syndrome Mother    Heart disease Father        CAD   Parkinsonism Father    Cancer Sister        Breast   Breast cancer Sister    Cancer Other        Breast and Ovarian   Breast cancer Other    Heart disease Brother    Schizophrenia Sister    Lung disease  Sister    Social History   Socioeconomic History   Marital status: Married    Spouse name: Not on file   Number of children: 2   Years of education: masters   Highest education level: Master's degree (e.g., MA, MS, MEng, MEd, MSW, MBA)  Occupational History   Occupation: Retired    Comment: Runner, broadcasting/film/video  Tobacco Use   Smoking status: Never   Smokeless tobacco: Never  Vaping Use   Vaping status: Never Used  Substance and Sexual Activity   Alcohol use: No   Drug use: No   Sexual activity: Not on file  Other Topics Concern   Not on file  Social History Narrative   Always uses seat belts. Smoke alarm in the home.  No caffeine use. 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 Married x 15 years;3rd marriage. Happily married. Husband with bladder cancer and possible gastric cancer 2012-2013. Has 2 children, 2 grandchildren.   Social Drivers of Corporate investment banker Strain: Low Risk  (02/11/2024)   Overall Financial Resource Strain (CARDIA)    Difficulty of Paying Living Expenses: Not hard at all  Food Insecurity: No Food Insecurity (02/11/2024)   Hunger Vital Sign    Worried About Running Out of Food in the Last Year: Never true    Ran Out of Food in the Last Year: Never true  Transportation Needs: No Transportation Needs (02/11/2024)   PRAPARE - Administrator, Civil Service (Medical): No    Lack of Transportation (Non-Medical): No  Physical Activity: Sufficiently Active (02/11/2024)   Exercise Vital Sign    Days of Exercise per Week: 7 days    Minutes of Exercise per Session: 60 min  Stress: No Stress Concern Present (02/11/2024)   [employer] of Occupational Health - Occupational Stress Questionnaire    Feeling of Stress : Only a little  Social Connections: Moderately Isolated (02/11/2024)   Social Connection and Isolation Panel [NHANES]    Frequency of Communication with Friends and Family: Once a week     Frequency of Social Gatherings with Friends and Family: Once a week    Attends Religious Services: Never    Database administrator or Organizations: Yes    Attends Engineer, structural: 1 to 4 times per year    Marital Status: Married    Tobacco Counseling Counseling given: Not Answered    Clinical Intake:  Pre-visit preparation completed: Yes  Pain : No/denies pain     BMI - recorded: 20.8 Nutritional Status: BMI of 19-24  Normal Nutritional Risks: None Diabetes: No  Lab Results  Component Value Date   HGBA1C 5.3 01/05/2024   HGBA1C 5.5 12/26/2011     How often do you need to have someone help you when you read instructions, pamphlets, or other written materials from your doctor or pharmacy?: 1 - Never  Interpreter Needed?: No  Information entered by :: Dellie Fergusson, LPN   Activities of Daily Living    02/11/2024    9:39 AM 03/28/2023   11:34 AM  In your present state of health, do you have any difficulty performing the following activities:  Hearing? 1 1  Vision? 1 1  Difficulty concentrating or making decisions? 0 0  Walking or climbing stairs? 1 0  Comment ARTHRITIS   Dressing or bathing? 0 0  Doing errands, shopping? 0 0  Preparing Food and eating ? N   Using the Toilet? N   In the past six months, have you accidently leaked urine? N   Do you have problems with loss of bowel control? N   Managing your Medications? N   Managing your Finances? N   Housekeeping or managing your Housekeeping? N     Patient Care Team: Lamon Pillow, MD as PCP - General (Family Medicine) Antonette Batters, MD as Consulting Physician (Cardiology) Selena Daily, MD as Consulting Physician (Gastroenterology) Julia Oats, OD (Optometry) Marc Senior, MD as Consulting Physician (Pulmonary Disease) Prescilla Brod, MD as Consulting Physician (Obstetrics and Gynecology)  Indicate any recent Medical Services you may have received from other than  [facility] providers in the past year (date may be approximate).     Assessment:  This is a routine wellness examination for Londyn.  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 0 0 0 0 0  Number falls in past yr: 0 0 0 0 0  Injury with Fall? 0 0 0 0 0  Risk for fall due to : No Fall Risks   No Fall Risks No Fall Risks  Follow up Falls prevention discussed;Falls evaluation completed Falls evaluation completed  Falls evaluation completed Falls prevention discussed;Education provided    MEDICARE RISK AT HOME:  Medicare Risk at Home Any stairs in or around the home?: Yes If so, are there any without handrails?: No Home free of loose throw rugs in walkways, pet beds, electrical cords, etc?: Yes Adequate lighting in your home to reduce risk of falls?: Yes Life alert?: No Use of a cane, walker or w/c?: No Grab bars in the bathroom?: No Shower chair or bench in shower?: Yes Elevated toilet seat or a handicapped toilet?: No (HAS A TOILET RISER)  TIMED UP AND GO:  Was the test performed?  No  Cognitive Function: 6CIT completed        02/11/2024    9:41 AM 01/29/2023    1:13 PM  6CIT Screen  What Year? 0 points 0 points  What month? 0 points 0 points  What time? 0 points 0 points  Count back from 20 0 points 0 points  Months  in reverse 0 points 0 points  Repeat phrase 0 points 2 points  Total Score 0 points 2 points    Immunizations Immunization History  Administered Date(s) Administered   Fluad Quad(high Dose 65+) 06/16/2019, 09/18/2020   Fluad Trivalent(High Dose 65+) 08/19/2023   Influenza Split 07/06/2012   Influenza, High Dose Seasonal PF 08/22/2017, 07/01/2018   Influenza-Unspecified 06/22/2011, 08/19/2013, 09/20/2014, 08/07/2015, 08/27/2016, 08/22/2017   PFIZER(Purple Top)SARS-COV-2 Vaccination 11/27/2019, 12/20/2019, 07/25/2020   Pneumococcal Conjugate-13 07/22/2019   Pneumococcal Polysaccharide-23 05/01/2012, 11/21/2020   Td 10/21/2001   Tdap 12/24/2010   Zoster, Live 05/01/2013    Screening Tests Health Maintenance  Topic Date Due   DEXA SCAN  Never done   Zoster Vaccines- Shingrix (1 of 2) 03/27/1967   Colonoscopy  10/13/2019   DTaP/Tdap/Td (3 - Td or Tdap) 12/23/2020   COVID-19 Vaccine (4 - 2024-25 season) 06/22/2023   INFLUENZA VACCINE  05/21/2024   MAMMOGRAM  12/17/2024   Medicare Annual Wellness (AWV)  02/10/2025   Pneumonia Vaccine 58+ Years old  Completed   Hepatitis C Screening  Completed   HPV VACCINES  Aged Out   Meningococcal B Vaccine  Aged Out    Health Maintenance  Health Maintenance Due  Topic Date Due   DEXA SCAN  Never done   Zoster Vaccines- Shingrix (1 of 2) 03/27/1967   Colonoscopy  10/13/2019   DTaP/Tdap/Td (3 - Td or Tdap) 12/23/2020   COVID-19 Vaccine (4 - 2024-25 season) 06/22/2023   Health Maintenance Items Addressed: MAMMOGRAM UP TO DATE, DECLINES COLONOSCOPY, DECLINES BONE DENSITY SCAN; SHOTS UP TO DATE  Additional Screening:  Vision Screening: Recommended annual ophthalmology exams for early detection of glaucoma and other disorders of the eye.  Dental Screening: Recommended annual dental exams for proper oral hygiene  Community Resource Referral / Chronic Care Management: CRR required this visit?  No   CCM required this visit?  No      Plan:     I have personally reviewed and noted the following in the patient's chart:   Medical and social history Use of alcohol, tobacco or illicit drugs  Current medications and supplements including opioid prescriptions. Patient is not currently taking opioid prescriptions. Functional ability and status Nutritional status Physical activity Advanced directives List of other physicians Hospitalizations, surgeries, and ER visits in previous 12 months Vitals Screenings to include cognitive, depression, and falls Referrals and appointments  In addition, I have reviewed and discussed with patient certain preventive protocols, quality metrics, and best practice recommendations. A written personalized care plan for preventive services as well as general preventive health recommendations were provided to patient.     Pinky Bright, LPN   1/61/0960   After Visit Summary: (MyChart) Due to this being a telephonic visit, the after visit summary with patients personalized plan was offered to patient via MyChart   Notes: Nothing significant to report at this time.

## 2024-02-11 NOTE — Patient Instructions (Addendum)
 Kimberly Mercer , Thank you for taking time to come for your Medicare Wellness Visit. I appreciate your ongoing commitment to your health goals. Please review the following plan we discussed and let me know if I can assist you in the future.   Referrals/Orders/Follow-Ups/Clinician Recommendations: NONE  This is a list of the screening recommended for you and due dates:  Health Maintenance  Topic Date Due   DEXA scan (bone density measurement)  Never done   Zoster (Shingles) Vaccine (1 of 2) 03/27/1967   Colon Cancer Screening  10/13/2019   DTaP/Tdap/Td vaccine (3 - Td or Tdap) 12/23/2020   COVID-19 Vaccine (4 - 2024-25 season) 06/22/2023   Flu Shot  05/21/2024   Mammogram  12/17/2024   Medicare Annual Wellness Visit  02/10/2025   Pneumonia Vaccine  Completed   Hepatitis C Screening  Completed   HPV Vaccine  Aged Out   Meningitis B Vaccine  Aged Out    Advanced directives: (ACP Link)Information on Advanced Care Planning can be found at [employer] Advance Health Care Directives Advance Health Care Directives. [email or web address]   Next Medicare Annual Wellness Visit scheduled for next year: Yes  02/16/25 @ 9:30 AM BY PHONE

## 2024-02-25 DIAGNOSIS — Z9841 Cataract extraction status, right eye: Secondary | ICD-10-CM | POA: Diagnosis not present

## 2024-02-25 DIAGNOSIS — K219 Gastro-esophageal reflux disease without esophagitis: Secondary | ICD-10-CM | POA: Diagnosis not present

## 2024-02-25 DIAGNOSIS — H401122 Primary open-angle glaucoma, left eye, moderate stage: Secondary | ICD-10-CM | POA: Diagnosis not present

## 2024-02-25 DIAGNOSIS — Z961 Presence of intraocular lens: Secondary | ICD-10-CM | POA: Diagnosis not present

## 2024-02-25 DIAGNOSIS — J45909 Unspecified asthma, uncomplicated: Secondary | ICD-10-CM | POA: Diagnosis not present

## 2024-02-25 DIAGNOSIS — Z79899 Other long term (current) drug therapy: Secondary | ICD-10-CM | POA: Diagnosis not present

## 2024-02-25 DIAGNOSIS — H25812 Combined forms of age-related cataract, left eye: Secondary | ICD-10-CM | POA: Diagnosis not present

## 2024-02-25 DIAGNOSIS — H2512 Age-related nuclear cataract, left eye: Secondary | ICD-10-CM | POA: Diagnosis not present

## 2024-02-25 DIAGNOSIS — Z7982 Long term (current) use of aspirin: Secondary | ICD-10-CM | POA: Diagnosis not present

## 2024-03-25 ENCOUNTER — Other Ambulatory Visit: Payer: Self-pay | Admitting: Family Medicine

## 2024-03-25 DIAGNOSIS — E079 Disorder of thyroid, unspecified: Secondary | ICD-10-CM

## 2024-04-28 ENCOUNTER — Ambulatory Visit (INDEPENDENT_AMBULATORY_CARE_PROVIDER_SITE_OTHER): Admitting: Family Medicine

## 2024-04-28 ENCOUNTER — Encounter: Payer: Self-pay | Admitting: Family Medicine

## 2024-04-28 VITALS — BP 117/69 | HR 71 | Ht 67.0 in | Wt 129.1 lb

## 2024-04-28 DIAGNOSIS — E041 Nontoxic single thyroid nodule: Secondary | ICD-10-CM | POA: Diagnosis not present

## 2024-04-28 DIAGNOSIS — R221 Localized swelling, mass and lump, neck: Secondary | ICD-10-CM | POA: Diagnosis not present

## 2024-04-28 DIAGNOSIS — Z86018 Personal history of other benign neoplasm: Secondary | ICD-10-CM | POA: Diagnosis not present

## 2024-04-28 NOTE — Progress Notes (Signed)
 "     ACUTE VISIT   Patient: Kimberly Mercer   DOB: 1945/12/05   78 y.o. Female  MRN: [id number]   PCP: Gasper Nancyann BRAVO, MD  Chief Complaint  Patient presents with   Mass    Patient is present due to noticing a lump on neck and would like to get it checked, states it is in an area that she already had surgery so she is concerned. Lump was noticed last week when looking in the mirror, pain when touching, swelling and no redness.    Subjective    HPI HPI     Mass    Additional comments: Patient is present due to noticing a lump on neck and would like to get it checked, states it is in an area that she already had surgery so she is concerned. Lump was noticed last week when looking in the mirror, pain when touching, swelling and no redness.       Last edited by Lilian Fitzpatrick, CMA on 04/28/2024  1:42 PM.       Discussed the use of AI scribe software for clinical note transcription with the patient, who gave verbal consent to proceed.  History of Present Illness Kimberly Mercer is a 78 year old female who presents with a new neck mass.  She noticed the neck mass last week while looking in the mirror. It is located in the area of her previous thyroid  surgery approximately 30 years ago. Initially, the mass was tender, but the tenderness has decreased. She experiences pain upon touching the mass, and pressing on it induces coughing. There is no associated redness.  No recent fever, but she experienced chills during a recent cold that lasted a couple of weeks. No swollen glands or current difficulty swallowing, although she had past swallowing difficulties due to postural issues following significant weight loss from an E. coli infection.  Her past medical history includes a benign thyroid  tumor removal, a precancerous uterine polyp removal, and bilateral oophorectomy. She manages asthma and gastrointestinal issues at home and has regular cardiology checkups.  She has a family  history of breast cancer, and her husband has had multiple cancers, including kidney and prostate cancer. 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     Medications: Outpatient Medications Prior to Visit  Medication Sig   acetaminophen  (TYLENOL ) 325 MG tablet Take by mouth.   albuterol  (PROVENTIL ) (2.5 MG/3ML) 0.083% nebulizer solution Take 3 mLs (2.5 mg total) by nebulization every 6 (six) hours as needed for wheezing or shortness of breath. Dx:J45.909   albuterol  (VENTOLIN  HFA) 108 (90 Base) MCG/ACT inhaler INHALE 2 PUFFS BY MOUTH EVERY 6 HOURS AS NEEDED FOR WHEEZING OR SHORTNESS OF BREATH   Calcium -Magnesium-Zinc (CAL-MAG-ZINC PO) Take 1 tablet by mouth daily.   CANNABIDIOL PO Take 30 mg by mouth daily. Capsule   cholecalciferol  (VITAMIN D3) 25 MCG (1000 UNIT) tablet Take 1,000 Units by mouth daily.   diclofenac sodium (VOLTAREN) 1 % GEL Apply 2 g topically 4 (four) times daily as needed (pain.).   diphenhydrAMINE (BENADRYL) 25 MG tablet Take 25 mg by mouth every 6 (six) hours as needed (allergies.).   famotidine  (PEPCID ) 20 MG tablet Take by mouth.   fluconazole  (DIFLUCAN ) 150 MG tablet Take 1 tablet (150 mg total) by mouth every 3 (three) days as needed (for vaginal itching/yeast infection sx).   fluconazole  (DIFLUCAN ) 200 MG tablet Take 1 tablet (200 mg total) by  mouth every other day. (Patient taking differently: Take 200 mg by mouth every other day. Taking as needed)   latanoprost (XALATAN) 0.005 % ophthalmic solution 1 drop at bedtime.   levothyroxine  (SYNTHROID ) 50 MCG tablet TAKE ONE TABLET BY MOUTH EVERY DAY BEFORE BREAKFAST   montelukast (SINGULAIR) 10 MG tablet TAKE ONE TABLET BY MOUTH EVERY EVENING   Multiple Vitamin (MULTIVITAMIN WITH MINERALS) TABS tablet Take 1 tablet by mouth daily.   NONFORMULARY OR COMPOUNDED ITEM Estriole 1mg /gm vaginal cream Apply 0.5gm vaginally daily   omeprazole  (PRILOSEC) 40 MG capsule Take 1 capsule (40  mg total) by mouth 2 (two) times daily before a meal. (Patient taking differently: Take 40 mg by mouth 2 (two) times daily as needed.)   pseudoephedrine (SUDAFED) 60 MG tablet Take 30 mg by mouth every morning.   Spacer/Aero-Holding Chambers (AEROCHAMBER MV) inhaler Use as instructed   vitamin E 180 MG (400 UNITS) capsule Take 400 Units by mouth daily.    No facility-administered medications prior to visit.    Last CBC Lab Results  Component Value Date   WBC 3.9 01/06/2024   HGB 14.1 01/06/2024   HCT 40.7 01/06/2024   MCV 94 01/06/2024   MCH 32.6 01/06/2024   RDW 11.8 01/06/2024   PLT 259 01/06/2024   Last thyroid  functions Lab Results  Component Value Date   TSH 0.968 01/06/2024   T4TOTAL 7.1 03/21/2020        Objective    BP 117/69 (BP Location: Left Arm, Patient Position: Sitting, Cuff Size: Normal)   Pulse 71   Ht 5' 7 (1.702 m)   Wt 129 lb 1.6 oz (58.6 kg)   SpO2 98%   BMI 20.22 kg/m    Physical Exam   Physical Exam NECK: Right-sided thyroid  tenderness with a 1.5 cm nodule. No lymphadenopathy in anterior, posterior cervical, or supraclavicular chains. No skin changes over the neck nodule.   No results found for any visits on 04/28/24.  Assessment & Plan      Assessment & Plan Neck Mass A 1.5 cm right-sided thyroid  nodule with tenderness, no skin changes, and no palpable lymphadenopathy. Differential diagnosis includes benign thyroid  nodule. No B symptoms reported. Recent viral illness resolved. Given her history and family history of cancer, she is concerned about the mass. Expedited diagnostic evaluation is planned to alleviate concerns and determine if further intervention is necessary. - Order ultrasound of the neck at the outpatient imaging center - Order TSH, T4, and T3 levels - Order CBC - Coordinate with imaging center to expedite ultrasound scheduling  Uterine Polyp Precancerous uterine polyp removed with concurrent oophorectomy due to ovarian  issues. No recurrence of cancer-related problems since surgery. She expressed regret over the oophorectomy due to subsequent struggles without estrogen.        No follow-ups on file.        Rockie Agent, MD  [facility] [phone] (phone) [phone] (fax)  [facility] "

## 2024-04-29 ENCOUNTER — Encounter: Payer: Self-pay | Admitting: Family Medicine

## 2024-04-29 LAB — CBC WITH DIFFERENTIAL/PLATELET
Basophils Absolute: 0 x10E3/uL (ref 0.0–0.2)
Basos: 1 %
EOS (ABSOLUTE): 0.1 x10E3/uL (ref 0.0–0.4)
Eos: 2 %
Hematocrit: 43 % (ref 34.0–46.6)
Hemoglobin: 14 g/dL (ref 11.1–15.9)
Immature Grans (Abs): 0 x10E3/uL (ref 0.0–0.1)
Immature Granulocytes: 0 %
Lymphocytes Absolute: 1.2 x10E3/uL (ref 0.7–3.1)
Lymphs: 31 %
MCH: 31.3 pg (ref 26.6–33.0)
MCHC: 32.6 g/dL (ref 31.5–35.7)
MCV: 96 fL (ref 79–97)
Monocytes Absolute: 0.3 x10E3/uL (ref 0.1–0.9)
Monocytes: 9 %
Neutrophils Absolute: 2.2 x10E3/uL (ref 1.4–7.0)
Neutrophils: 57 %
Platelets: 297 x10E3/uL (ref 150–450)
RBC: 4.47 x10E6/uL (ref 3.77–5.28)
RDW: 12.1 % (ref 11.7–15.4)
WBC: 3.9 x10E3/uL (ref 3.4–10.8)

## 2024-04-29 LAB — TSH+T4F+T3FREE
Free T4: 1.22 ng/dL (ref 0.82–1.77)
T3, Free: 2.9 pg/mL (ref 2.0–4.4)
TSH: 1.25 u[IU]/mL (ref 0.450–4.500)

## 2024-04-30 ENCOUNTER — Ambulatory Visit
Admission: RE | Admit: 2024-04-30 | Discharge: 2024-04-30 | Disposition: A | Discharge status: Home / Self Care | Source: Ambulatory Visit | Attending: Family Medicine | Admitting: Family Medicine

## 2024-04-30 DIAGNOSIS — Z86018 Personal history of other benign neoplasm: Secondary | ICD-10-CM | POA: Diagnosis not present

## 2024-04-30 DIAGNOSIS — E042 Nontoxic multinodular goiter: Secondary | ICD-10-CM | POA: Diagnosis not present

## 2024-04-30 DIAGNOSIS — R221 Localized swelling, mass and lump, neck: Secondary | ICD-10-CM | POA: Insufficient documentation

## 2024-04-30 DIAGNOSIS — E041 Nontoxic single thyroid nodule: Secondary | ICD-10-CM | POA: Insufficient documentation

## 2024-05-03 ENCOUNTER — Ambulatory Visit: Payer: Self-pay | Admitting: Family Medicine

## 2024-05-03 DIAGNOSIS — E042 Nontoxic multinodular goiter: Secondary | ICD-10-CM

## 2024-05-10 ENCOUNTER — Other Ambulatory Visit: Payer: Self-pay | Admitting: Family Medicine

## 2024-05-10 NOTE — Telephone Encounter (Unsigned)
 Copied from CRM [id number]. Topic: Clinical - Medication Refill >> May 10, 2024 10:58 AM Nathanel BROCKS wrote: Pts dr is out on maternity leave, can you refill it for her  Medication: latanoprost (XALATAN) 0.005 % ophthalmic solution   Has the patient contacted their pharmacy? No  This is the patient's preferred pharmacy:  [facility] [phone] [facility] [place] - [address] [place] [address] Phone: [phone] Fax: [phone]  Is this the correct pharmacy for this prescription? Yes If no, delete pharmacy and type the correct one.   Has the prescription been filled recently? No  Is the patient out of the medication? Yes  Has the patient been seen for an appointment in the last year OR does the patient have an upcoming appointment? Yes  Can we respond through MyChart? No  Agent: Please be advised that Rx refills may take up to 3 business days. We ask that you follow-up with your pharmacy.

## 2024-05-10 NOTE — Telephone Encounter (Signed)
 FYI Only or Action Required?: Action required by provider: medication refill request.  Patient was last seen in primary care on 04/28/2024 by [name], MD.  Called Nurse Triage reporting Medication Refill.  Symptoms began today.  Interventions attempted: Nothing.  Symptoms are: stable.  Triage Disposition: No disposition on file.  Patient/caregiver understands and will follow disposition?:

## 2024-05-12 NOTE — Telephone Encounter (Signed)
 Requested medication (s) are due for refill today: yes  Requested medication (s) are on the active medication list: yes  Last refill:  07/02/22  Future visit scheduled: yes  Notes to clinic:  Unable to refill per protocol, last refill by another provider.      Requested Prescriptions  Pending Prescriptions Disp Refills   latanoprost (XALATAN) 0.005 % ophthalmic solution 2.5 mL     Sig: 1 drop at bedtime.     Ophthalmology:  Glaucoma Passed - 05/12/2024 12:55 PM      Passed - Valid encounter within last 12 months    Recent Outpatient Visits           2 weeks ago Lump in neck   [facility] Simmons-Robinson, Madeline, MD   4 months ago Hyperglycemia   [facility] Gasper Nancyann BRAVO, MD   4 months ago Burning with urination   [facility] Gareth Mliss FALCON, FNP   11 years ago Otitis media   [facility], [place] HERO, MD   11 years ago Need for influenza vaccination   [facility] at [facility], [place] HERO, MD

## 2024-06-09 ENCOUNTER — Ambulatory Visit: Payer: Self-pay

## 2024-06-09 NOTE — Telephone Encounter (Addendum)
 FYI Only or Action Required?: Action required by provider: request for appointment.  Patient was last seen in primary care on 04/28/2024 by [name], MD.  Called Nurse Triage reporting Fatigue.  Symptoms began several months ago.  Interventions attempted: Other: Tylenol  and Ibuprofen, napping.  Symptoms are: unchanged. Pt also having hot and cold flashes, constipation Triage Disposition: See Physician Within 24 Hours  Patient/caregiver understands and will follow disposition?: no appts  Please ask if pt can be booked Friday by [name] or [name]. Offered appt for tomorrow and pt refused. Please call pt. Pt stated not getting appt with [name] is going to make me leave the Practice.          Copied from CRM #[id number]. Topic: Clinical - Red Word Triage >> Jun 09, 2024  9:12 AM Kimberly Mercer wrote: Red Word that prompted transfer to Nurse Triage: extreme fatigue due to thyroid  Reason for Disposition  [1] MODERATE weakness (e.g., interferes with work, school, normal activities) AND [2] persists > 3 days  Answer Assessment - Initial Assessment Questions 1. DESCRIPTION: Describe how you are feeling.     Feeling wooped-  2. SEVERITY: How bad is it?  Can you stand and walk?     Yes  3. ONSET: When did these symptoms begin? (e.g., hours, days, weeks, months)     Several months  4. CAUSE: What do you think is causing the weakness or fatigue? (e.g., not drinking enough fluids, medical problem, trouble sleeping)     Seeing endo - PCP dose adjusted and has been tired since then  5. NEW MEDICINES:  Have you started on any new medicines recently? (e.g., opioid pain medicines, benzodiazepines, muscle relaxants, antidepressants, antihistamines, neuroleptics, beta blockers)     no 6. OTHER SYMPTOMS: Do you have any other symptoms? (e.g., chest pain, fever, cough, SOB, vomiting, diarrhea, bleeding, other areas of pain)     Asthma can have chest  pain (not now)  Protocols used: Weakness (Generalized) and Fatigue-A-AH

## 2024-06-09 NOTE — Telephone Encounter (Signed)
 Called and spoke to the pt she has been scheduled for 8/22 at 8am

## 2024-06-09 NOTE — Telephone Encounter (Signed)
 She can have the 8am or 11:20 on Friday. Or she can have the 1pm on Monday

## 2024-06-10 DIAGNOSIS — E890 Postprocedural hypothyroidism: Secondary | ICD-10-CM | POA: Diagnosis not present

## 2024-06-10 DIAGNOSIS — E042 Nontoxic multinodular goiter: Secondary | ICD-10-CM | POA: Diagnosis not present

## 2024-06-11 ENCOUNTER — Encounter: Payer: Self-pay | Admitting: Family Medicine

## 2024-06-11 ENCOUNTER — Ambulatory Visit
Admission: RE | Admit: 2024-06-11 | Discharge: 2024-06-11 | Disposition: A | Discharge status: Home / Self Care | Source: Ambulatory Visit | Attending: Family Medicine | Admitting: Family Medicine

## 2024-06-11 ENCOUNTER — Ambulatory Visit
Admission: RE | Admit: 2024-06-11 | Discharge: 2024-06-11 | Disposition: A | Discharge status: Home / Self Care | Attending: Family Medicine | Admitting: Family Medicine

## 2024-06-11 ENCOUNTER — Ambulatory Visit (INDEPENDENT_AMBULATORY_CARE_PROVIDER_SITE_OTHER): Admitting: Family Medicine

## 2024-06-11 VITALS — BP 129/62 | HR 64 | Ht 67.0 in | Wt 131.0 lb

## 2024-06-11 DIAGNOSIS — R053 Chronic cough: Secondary | ICD-10-CM

## 2024-06-11 DIAGNOSIS — R0609 Other forms of dyspnea: Secondary | ICD-10-CM

## 2024-06-11 DIAGNOSIS — R5383 Other fatigue: Secondary | ICD-10-CM | POA: Diagnosis not present

## 2024-06-11 DIAGNOSIS — E079 Disorder of thyroid, unspecified: Secondary | ICD-10-CM | POA: Diagnosis not present

## 2024-06-11 DIAGNOSIS — I2510 Atherosclerotic heart disease of native coronary artery without angina pectoris: Secondary | ICD-10-CM | POA: Diagnosis not present

## 2024-06-11 DIAGNOSIS — G4719 Other hypersomnia: Secondary | ICD-10-CM | POA: Diagnosis not present

## 2024-06-11 DIAGNOSIS — J45909 Unspecified asthma, uncomplicated: Secondary | ICD-10-CM | POA: Diagnosis not present

## 2024-06-11 NOTE — Progress Notes (Signed)
 Established patient visit   Patient: Kimberly Mercer   DOB: 11-13-1945   78 y.o. Female  MRN: [id number] Visit Date: 06/11/2024  Today's healthcare provider: Nancyann Perry, MD   Chief Complaint  Patient presents with   Fatigue    She is always tired and dont know why, she has been seen and medication has been done but she is still experiencing it    Subjective    Discussed the use of AI scribe software for clinical note transcription with the patient, who gave verbal consent to proceed.  History of Present Illness   Kimberly Mercer is a 78 year old female who presents with persistent fatigue and respiratory issues.  She experiences persistent fatigue despite feeling like she is sleeping though the nigh. She is feeling tired throughout the day and requiring rest after minimal activity. She describes a lack of energy and experiences temperature fluctuations, with cold hands and a hot face. These symptoms have persisted for several months.  She has recently started evaluation for thyroid  nodules, two of which are scheduled for biopsy in a month. She discovered these nodules while experiencing a severe sore throat and respiratory infection, which she suspects might have been COVID-19. The infection led to prolonged coughing and respiratory issues, which continue to persist.  Her respiratory symptoms include a constant feeling of chest tightness and frequent coughing up of mucus. She uses an albuterol  inhaler, which she finds ineffective and irritating to her mouth. She reports sensitivity to humidity and environmental changes, which exacerbate her symptoms. She has a history of asthma but cannot tolerate steroid medications due to gastrointestinal issues. Has used ipratropium nebulizers in the past which she does not tolerate well.   She takes Benadryl and Tylenol  PM at night to help with symptoms but is cautious with medication due to sensitivity. She experiences dizziness upon  lying down and standing up, and burning sensations in her hands and feet, which are new symptoms.  She has a history of degenerative disc disease, which she associates with occasional headaches. Stretching helps alleviate these headaches. She has also experienced headaches triggered by heat exposure, which she describes as a newer issue.  Her gastrointestinal symptoms have improved after eliminating polysorbate from her diet, leading to better digestion and weight gain. She takes B12 supplements, which have helped with indigestion.       Medications: Outpatient Medications Prior to Visit  Medication Sig   cyanocobalamin (VITAMIN B12) 1000 MCG tablet Take 500 mcg by mouth daily.   acetaminophen  (TYLENOL ) 325 MG tablet Take by mouth.   albuterol  (PROVENTIL ) (2.5 MG/3ML) 0.083% nebulizer solution Take 3 mLs (2.5 mg total) by nebulization every 6 (six) hours as needed for wheezing or shortness of breath. Dx:J45.909   albuterol  (VENTOLIN  HFA) 108 (90 Base) MCG/ACT inhaler INHALE 2 PUFFS BY MOUTH EVERY 6 HOURS AS NEEDED FOR WHEEZING OR SHORTNESS OF BREATH   Calcium -Magnesium-Zinc (CAL-MAG-ZINC PO) Take 1 tablet by mouth daily.   CANNABIDIOL PO Take 30 mg by mouth daily. Capsule   cholecalciferol  (VITAMIN D3) 25 MCG (1000 UNIT) tablet Take 1,000 Units by mouth daily.   diclofenac sodium (VOLTAREN) 1 % GEL Apply 2 g topically 4 (four) times daily as needed (pain.).   diphenhydrAMINE (BENADRYL) 25 MG tablet Take 25 mg by mouth every 6 (six) hours as needed (allergies.).   famotidine  (PEPCID ) 20 MG tablet Take by mouth.   fluconazole  (DIFLUCAN ) 150 MG tablet Take 1 tablet (150 mg total)  by mouth every 3 (three) days as needed (for vaginal itching/yeast infection sx).   fluconazole  (DIFLUCAN ) 200 MG tablet Take 1 tablet (200 mg total) by mouth every other day. (Patient not taking: Reported on 06/11/2024)   latanoprost (XALATAN) 0.005 % ophthalmic solution 1 drop at bedtime.   levothyroxine  (SYNTHROID ) 50  MCG tablet TAKE ONE TABLET BY MOUTH EVERY DAY BEFORE BREAKFAST   montelukast (SINGULAIR) 10 MG tablet TAKE ONE TABLET BY MOUTH EVERY EVENING   Multiple Vitamin (MULTIVITAMIN WITH MINERALS) TABS tablet Take 1 tablet by mouth daily.   NONFORMULARY OR COMPOUNDED ITEM Estriole 1mg /gm vaginal cream Apply 0.5gm vaginally daily   omeprazole  (PRILOSEC) 40 MG capsule Take 1 capsule (40 mg total) by mouth 2 (two) times daily before a meal. (Patient taking differently: Take 40 mg by mouth 2 (two) times daily as needed.)   pseudoephedrine (SUDAFED) 60 MG tablet Take 30 mg by mouth every morning.   Spacer/Aero-Holding Chambers (AEROCHAMBER MV) inhaler Use as instructed   vitamin E 180 MG (400 UNITS) capsule Take 400 Units by mouth daily.    No facility-administered medications prior to visit.        Objective    BP 129/62   Pulse 64   Ht 5' 7 (1.702 m)   Wt 131 lb (59.4 kg)   SpO2 100%   BMI 20.52 kg/m   Physical Exam   General: Appearance:    Well developed, well nourished female in no acute distress  Eyes:    PERRL, conjunctiva/corneas clear, EOM's intact       Lungs:     Clear to auscultation bilaterally, respirations unlabored  Heart:    Normal heart rate. Normal rhythm. No murmurs, rubs, or gallops.    MS:   All extremities are intact.    Neurologic:   Awake, alert, oriented x 3. No apparent focal neurological defect.   Lymph:  No palpable axillary or cervical lymph nodes.      Assessment & Plan      1. Dyspnea on exertion (Primary) History of asthma intolerant to steroid and ipratropium. Previously followed by pulmonary. Symptoms worse since have RI last spring. Concerned about long Covid.   - Iron, TIBC and Ferritin Panel - B Nat Peptide - DG Chest 2 View; Future  2. Other fatigue  - Vitamin B12 - Lyme Disease Serology w/Reflex - Magnesium - Iron, TIBC and Ferritin Panel  3. Excessive daytime sleepiness She feels she is sleeping through the nigh but not rested in the  mornings. Concern the underlying pulmonary disease may be causing OSA.  - Ambulatory referral to Sleep Studies  4. Chronic cough  - DG Chest 2 View; Future       Nancyann Perry, MD  [facility] [phone] (phone) [phone] (fax)  [facility]

## 2024-06-11 NOTE — Patient Instructions (Signed)
 SABRA  Please review the attached list of medications and notify my office if there are any errors.   . Please bring all of your medications to every appointment so we can make sure that our medication list is the same as yours.

## 2024-06-14 LAB — LYME DISEASE SEROLOGY W/REFLEX

## 2024-06-14 LAB — IRON,TIBC AND FERRITIN PANEL
Ferritin: 202 ng/mL — ABNORMAL HIGH (ref 15–150)
Iron Saturation: 67 % — ABNORMAL HIGH (ref 15–55)
Iron: 180 ug/dL — ABNORMAL HIGH (ref 27–139)
Total Iron Binding Capacity: 269 ug/dL (ref 250–450)
UIBC: 89 ug/dL — ABNORMAL LOW (ref 118–369)

## 2024-06-14 LAB — BRAIN NATRIURETIC PEPTIDE: BNP: 34.6 pg/mL (ref 0.0–100.0)

## 2024-06-14 LAB — VITAMIN B12: Vitamin B-12: 919 pg/mL (ref 232–1245)

## 2024-06-14 LAB — MAGNESIUM: Magnesium: 2.3 mg/dL (ref 1.6–2.3)

## 2024-06-17 ENCOUNTER — Telehealth: Payer: Self-pay

## 2024-06-17 ENCOUNTER — Ambulatory Visit: Payer: Self-pay | Admitting: Family Medicine

## 2024-06-17 NOTE — Telephone Encounter (Unsigned)
 Copied from CRM #[id number]. Topic: Clinical - Lab/Test Results >> Jun 17, 2024  8:15 AM Ivette P wrote: Reason for CRM: Pt called in about lab results on 08/22, has not received a call. Looked in hcart no notes from provider.    Pls reach out to pt with determination

## 2024-06-17 NOTE — Telephone Encounter (Signed)
 Informed patient of her provider's message, she verbalized understanding however she is wanting to know when do you find it reasonable to have a recheck on her iron?  She stated she wanted to get control over it and have been watching what she eats.  Please advise.

## 2024-06-17 NOTE — Telephone Encounter (Signed)
 Her labs show she is getting a little too much iron. If she is taking any vitamins with iron she should cut back on it. Otherwise all of her labs are completely normal. Need to proceed with home sleep study that was ordered last week.

## 2024-06-18 NOTE — Telephone Encounter (Signed)
 Patient advised.

## 2024-06-18 NOTE — Telephone Encounter (Signed)
 We can recheck the iron levels in 3 months. We will contact her in November to se up labs.

## 2024-06-25 ENCOUNTER — Other Ambulatory Visit: Payer: Self-pay | Admitting: Family Medicine

## 2024-06-25 DIAGNOSIS — E079 Disorder of thyroid, unspecified: Secondary | ICD-10-CM

## 2024-07-13 DIAGNOSIS — E042 Nontoxic multinodular goiter: Secondary | ICD-10-CM | POA: Diagnosis not present

## 2024-07-22 NOTE — Progress Notes (Signed)
 Established Patient Visit   Chief Complaint: Chief Complaint  Patient presents with  . Chest Pain    With exertion occas; occas just happens   . Shortness of Breath    With exertion occas; occas just happens   . Dizziness    Occas when standing up    Date of Service: 07/28/2024 Date of Birth: 04-Jan-1946 PCP: Kimberly Nancyann BRAVO, MD  History of Present Illness: [name] is a 78 y.o.female patient who presents for a 6 month follow up. PMH significant for asthma, GERD, tremor, palpitation, tachycardia, SOB.  Today, pt presents with some recent SOB and chest pains. She has recently had severe fatigue. Patient reports having recent risk of thyroid  cancer as she had a nodule biopsied which showed moderate risk for it. She reports that the chest pain is very intermittent. The last time she felt it was last week while going up a hill. The pain presents itself as tightness that radiates from the center of her chest and that can cause light pain in her head. Denies having any recent palpitations. Order CTA with FFR for further assessment of chest pain and SOB.    Visit Summaries: 01/23/2024 Patient was seen by me for a follow up and presented without any cardiac complaints. No changes were made  Past Medical and Surgical History  Past Medical History Past Medical History:  Diagnosis Date  . Allergic state   . Asthma without status asthmaticus (HHS-HCC)   . Chicken pox   . E. coli infection   . GERD (gastroesophageal reflux disease)   . Gluten intolerance   . IBS (irritable bowel syndrome)   . Irritable bowel syndrome   . Thyroid  disease     Past Surgical History She has a past surgical history that includes hysterotomy abdominal; Thyroid  Tumor Removed (1997); Hysterectomy (2003); bilateral salpingoopherectomy (2003); Colonoscopy (12/18/2017); egd (12/18/2017); Eye surgery; extraction cataract intracapsular w/insertion intraocular prosthesis (Right, 03/28/2022); and extraction cataract  extracapsular w/insertion intraocular prosthesis (Left, 02/25/2024).   Medications and Allergies  Current Medications  Current Outpatient Medications  Medication Sig Dispense Refill  . acetaminophen  (TYLENOL ) 325 MG tablet Take 650 mg by mouth every 4 (four) hours as needed for Pain    . acetaminophen /diphenhydramine (TYLENOL  PM ORAL) Take by mouth at bedtime as needed    . ACTIVATED CHARCOAL ORAL Take 2 capsules by mouth 2 (two) times daily as needed    . calcium  carb-mag ox-zinc gluc 333-133-5 mg Tab Take 3 tablets by mouth once daily    . CANNABIDIOL, CBD, EXTRACT ORAL Take 35 mg by mouth once daily Takes CBD powder    . cholecalciferol  (VITAMIN D3) 1,000 unit capsule Take 2,000 Units by mouth once daily      . Compound Medication Estriol 1mg /g 1/2 app per vagina q hs x 4 wks then 1/2 app per vagina every other hs x 4 wks then 1/2 app per vagina 1-2x weekly Disp 30 g tube with 2 rf Called to [facility] 1 each 2  . cyanocobalamin (VITAMIN B12) 1000 MCG tablet Take 500 mcg by mouth once daily    . famotidine  (PEPCID ) 20 MG tablet Take 20 mg by mouth at bedtime as needed for Heartburn    . inhalational spacer (AEROCHAMBER MV) spacer     . latanoprost (XALATAN) 0.005 % ophthalmic solution Place 1 drop into both eyes at bedtime 7.5 mL 3  . levothyroxine  (SYNTHROID ) 50 MCG tablet Take 50 mcg by mouth once daily    . montelukast (SINGULAIR)  10 mg tablet TAKE 1 TABLET BY MOUTH IN THE EVENING 90 tablet 3  . multivitamin tablet Take 1 tablet by mouth once daily Multivitamin with probiotic     . omeprazole  (PRILOSEC) 40 MG DR capsule Take 40 mg by mouth as needed    . VENTOLIN  HFA 90 mcg/actuation inhaler INHALE 2 PUFFS BY MOUTH EVERY 6 HOURS AS NEEDED FOR WHEEZING 18 Inhaler 0  . vitamin E 400 UNIT capsule Take 400 Units by mouth once daily    . aspirin  81 MG EC tablet Take 81 mg by mouth once daily (Patient not taking: Reported on 07/28/2024)    . CALCIUM  ORAL Take by mouth (Patient not taking:  Reported on 07/28/2024)    . cetirizine  (ZYRTEC ) 10 MG tablet Take 1 tablet by mouth once daily (Patient not taking: Reported on 07/28/2024)    . levothyroxine  (SYNTHROID , LEVOTHROID) 75 MCG tablet TAKE 1 TABLET BY MOUTH EVERY DAY ON AN EMPTY STOMACH (Patient not taking: Reported on 07/28/2024) 90 tablet 1  . montelukast (SINGULAIR) 10 mg tablet Take 1 tablet by mouth every evening (Patient not taking: Reported on 07/28/2024)     No current facility-administered medications for this visit.    Allergies: Fructose, Gluten, Lactose, Levofloxacin, Loratadine, and Sulfa (sulfonamide antibiotics)  Social and Family History  Social History  reports that she has never smoked. She has never used smokeless tobacco. She reports that she does not drink alcohol and does not use drugs.  Family History Family History  Problem Relation Name Age of Onset  . High blood pressure (Hypertension) Mother    . Myocardial Infarction (Heart attack) Mother    . Stroke Mother    . High blood pressure (Hypertension) Father    . Myocardial Infarction (Heart attack) Father    . Stroke Father    . Parkinsonism Father    . Breast cancer Sister  78  . Schizophrenia Sister    . Heart block Brother    . Other (spinal cerebral ataxia) Brother    . Breast cancer Maternal Aunt    . Uterine cancer Maternal Aunt    . Arthritis Maternal Grandmother      Review of Systems   Pertinent positives and negatives are mentioned above in HPI and all other systems are negative.  Physical Examination   Vitals:BP 110/62 (BP Location: Left upper arm, Patient Position: Sitting, BP Cuff Size: Adult)   Pulse 62   Resp 12   Ht 170.2 cm (5' 7)   Wt 58.1 kg (128 lb)   SpO2 97%   BMI 20.05 kg/m  Ht:170.2 cm (5' 7) Wt:58.1 kg (128 lb) ADJ:Anib surface area is 1.66 meters squared. Body mass index is 20.05 kg/m.  HEENT: Pupils equally reactive to light and accomodation  Neck: Supple without thyromegaly, carotid pulses 2+ Lungs:  clear to auscultation bilaterally; no wheezes, rales, rhonchi Heart: Regular rate and rhythm.  No gallops, murmurs or rub Abdomen: soft nontender, nondistended, with normal bowel sounds Extremities: no cyanosis, clubbing, or edema Peripheral Pulses: 2+ in all extremities, 2+ femoral pulses bilaterally Neurologic: Alert and oriented X3; speech intact; face symmetrical; moves all extremities well  Cardiovascular Studies:    Echocardiogram 2D complete: (09/05/2020) INTERPRETATION NORMAL LEFT VENTRICULAR SYSTOLIC FUNCTION WITH AN ESTIMATED EF = >55 % NORMAL RIGHT VENTRICULAR SYSTOLIC FUNCTION MILD-TO-MODERATE TRICUSPID VALVE INSUFFICIENCY TRACE MITRAL AND AORTIC VALVE INSUFFICIENCY NO VALVULAR STENOSIS MILD RV ENLARGEMENT MILD RA ENLARGEMENT   NM Myocardial Perfusion SPECT multiple (stress and rest):  Cardiac Catheterization:  08/25/2020 Narrative This result has an attachment that is not available.  Normal Coronaries  Normal LVF  Normal cath  Conclusion Normal LVF Normal Wall motion EF=60%  Holter:  Cardiac CT Scan:  Cardiac MRI:   Assessment   78 y.o. female with  1. Atypical chest pain   2. SOB (shortness of breath)   3. Mild intermittent asthma without complication (HHS-HCC)   4. Gastroesophageal reflux disease, unspecified whether esophagitis present   5. Tachycardia   6. Palpitation   7. Chest pain on exertion   8. Angina at rest ()    Plan   Chest pain, possibly cardiac intermittent, order CTA with FFR for further assessment  Asthma, continue inhalers as needed, follow up with pulmonary as needed GERD, continue omeprazole  therapy for reflux type symptoms  Shortness of breath mild continue conservative therapy with inhalers Tachycardia intermittent recurrent mild palpitations seems to have improved recently Have the patient follow-up in 6 months     Return in about 2 months (around 09/27/2024) for Follow CTA heart .  This note is partially  written by Leita Ellen, in the presence of and acting as the scribe of Dr. Cara Lovelace.      Leita Ellen  I have reviewed, edited and added to the note to reflect my best personal medical judgment.  Attestation Statement:   I personally performed the service. (TP)  DWAYNE JONETTA LOVELACE, MD  [facility] [place], [place] Ph:  [phone] Fax:  [phone] This note was generated in part with voice recognition software, Dragon.  I apologize for any typographical errors that were not detected and corrected from this process.  They are unintentional.

## 2024-07-27 NOTE — Progress Notes (Signed)
 Assessment & Plan:  1. Persistent asthma without complication, unspecified asthma severity (Primary)  2. Mild persistent asthma in adult without complication  3. Shortness of breath  4. Thrush   Patient Instructions  1.  You have been provided a spacer for your inhaler.  Rinse your mouth well with a solution of baking soda after using your inhaler.  2.  You have been provided a prescription for fluconazole  for thrush.  3.  We renewed your albuterol  for nebulizer solution.  4.  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  5.  See in follow-up in 3 months time please call sooner should any new respiratory issues arise.   Please note: late entry documentation due to logistical difficulties during COVID-19 pandemic. This note is filed for information purposes only, and is not intended to be used for billing, nor does it represent the full scope/nature of the visit in question. Please see any associated scanned media linked to date of encounter for additional pertinent information.  Subjective:    HPI: Kimberly Mercer is a 78 y.o. female presenting to the pulmonology clinic on 09/06/2019 with report of: Follow-up (review OFT- breathing is well. occ sob with exertion and wheezing. )     Outpatient Encounter Medications as of 09/06/2019  Medication Sig Note   CANNABIDIOL PO Take 30 mg by mouth daily. Capsule 02/11/2024: PRN   vitamin E 180 MG (400 UNITS) capsule Take 400 Units by mouth daily.     [DISCONTINUED] acetaminophen  (TYLENOL ) 650 MG CR tablet Take 650 mg by mouth every 8 (eight) hours as needed for pain.     [DISCONTINUED] albuterol  (PROVENTIL  HFA;VENTOLIN  HFA) 108 (90 Base) MCG/ACT inhaler TAKE 2 PUFFS BY MOUTH EVERY 6 HOURS AS NEEDED FOR WHEEZE OR SHORTNESS OF BREATH    [DISCONTINUED] aspirin  81 MG tablet Take 81 mg by mouth daily.    [DISCONTINUED] CALCIUM -MAGNESUIUM-ZINC 333-133-8.3 MG TABS Take 3 tablets by mouth daily.    [DISCONTINUED] Cholecalciferol  (VITAMIN D3) 2000  units TABS Take 2,000 Units by mouth daily.     [DISCONTINUED] Famotidine  (PEPCID  PO) Take 20 mg by mouth as needed.     [DISCONTINUED] fluticasone  (FLONASE ) 50 MCG/ACT nasal spray SPRAY 2 SPRAYS INTO EACH NOSTRIL EVERY DAY    [DISCONTINUED] fluticasone  (FLOVENT  HFA) 220 MCG/ACT inhaler Inhale 1 puff into the lungs 2 (two) times daily.    [DISCONTINUED] ipratropium (ATROVENT ) 0.02 % nebulizer solution Take 1.25 mLs (0.25 mg total) by nebulization every 4 (four) hours as needed for wheezing or shortness of breath.    [DISCONTINUED] levothyroxine  (SYNTHROID ) 75 MCG tablet TAKE 1 TABLET BY MOUTH EVERY DAY ON AN EMPTY STOMACH    [DISCONTINUED] montelukast (SINGULAIR) 10 MG tablet TAKE 1 TABLET BY MOUTH IN THE EVENING    [DISCONTINUED] Multiple Vitamin (MULTIVITAMIN) tablet Take 1 tablet by mouth daily.    [DISCONTINUED] mupirocin  ointment (BACTROBAN ) 2 % Apply topically 2 (two) times daily. (Patient taking differently: Apply 1 application topically 2 (two) times daily as needed (rash). )    diclofenac sodium (VOLTAREN) 1 % GEL Apply 2 g topically 4 (four) times daily as needed (pain.).    [EXPIRED] fluconazole  (DIFLUCAN ) 100 MG tablet Take 1 tablet (100 mg total) by mouth daily for 7 days. For thrush    Spacer/Aero-Holding Chambers (AEROCHAMBER MV) inhaler Use as instructed    [DISCONTINUED] albuterol  (PROVENTIL ) (2.5 MG/3ML) 0.083% nebulizer solution Take 3 mLs (2.5 mg total) by nebulization every 6 (six) hours as needed for wheezing or shortness of breath.    [  DISCONTINUED] beclomethasone (QVAR  REDIHALER) 80 MCG/ACT inhaler Inhale 2 puffs into the lungs 2 (two) times daily. (Patient not taking: Reported on 09/06/2019)    [DISCONTINUED] fluticasone  furoate-vilanterol (BREO ELLIPTA ) 200-25 MCG/INH AEPB Inhale 1 puff into the lungs daily.    No facility-administered encounter medications on file as of 09/06/2019.      Objective:   Vitals:   09/06/19 0830  BP: 128/72  Pulse: (!) 55  Temp: 97.9  F (36.6 C)  Height: 5' 7 (1.702 m)  Weight: 140 lb (63.5 kg)  SpO2: 97%  TempSrc: Temporal  BMI (Calculated): 21.92     Physical exam documentation is limited by delayed entry of information.

## 2024-07-27 NOTE — Progress Notes (Signed)
 Assessment & Plan:  1. Persistent asthma without complication, unspecified asthma severity (Primary) - Pulmonary Function Test [facility] Only; Future  2. Shortness of breath   Patient Instructions  1.  We will schedule breathing tests.  2.  We have gone blood test to check for allergies and also to check your immunity.  Also checking for alpha-1 antitrypsin which is a hereditary disease of the lungs.  3.  We will give a trial of Qvar  2 puffs twice a day.  Use your albuterol  before the Qvar .  Rinse your mouth well after the Qvar .  You may put some baking soda in the water that you rinse with.  4.  We may need you to see your allergist again.  5.  If your drug plan does not cover the Qvar  continue using the Breo until we can get an exception for it.   6.  We will see him in follow-up in 3 to 4 weeks time call sooner if you have any difficulties.  Please note: late entry documentation due to logistical difficulties during COVID-19 pandemic. This note is filed for information purposes only, and is not intended to be used for billing, nor does it represent the full scope/nature of the visit in question. Please see any associated scanned media linked to date of encounter for additional pertinent information.  Subjective:    HPI: Kimberly Mercer is a 78 y.o. female presenting to the pulmonology clinic on 08/17/2019 with report of: Consult (Referred by Kimberly Delon Dickinson Kimberly. She reports she got sick back in December and was later Dx with Emphysema. She reports she still has some residual SOB. Using Breo daily and albuterol  prn. )     Outpatient Encounter Medications as of 08/17/2019  Medication Sig Note   CANNABIDIOL PO Take 30 mg by mouth daily. Capsule 02/11/2024: PRN   diclofenac sodium (VOLTAREN) 1 % GEL Apply 2 g topically 4 (four) times daily as needed (pain.).    vitamin E 180 MG (400 UNITS) capsule Take 400 Units by mouth daily.     [DISCONTINUED] acetaminophen  (TYLENOL ) 650  MG CR tablet Take 650 mg by mouth every 8 (eight) hours as needed for pain.     [DISCONTINUED] albuterol  (PROVENTIL  HFA;VENTOLIN  HFA) 108 (90 Base) MCG/ACT inhaler TAKE 2 PUFFS BY MOUTH EVERY 6 HOURS AS NEEDED FOR WHEEZE OR SHORTNESS OF BREATH    [DISCONTINUED] aspirin  81 MG tablet Take 81 mg by mouth daily.    [DISCONTINUED] CALCIUM -MAGNESUIUM-ZINC 333-133-8.3 MG TABS Take 3 tablets by mouth daily.    [DISCONTINUED] Cholecalciferol  (VITAMIN D3) 2000 units TABS Take 2,000 Units by mouth daily.     [DISCONTINUED] Famotidine  (PEPCID  PO) Take 20 mg by mouth as needed.     [DISCONTINUED] fluticasone  (FLONASE ) 50 MCG/ACT nasal spray Place 2 sprays into the nose daily. (Patient taking differently: Place 2 sprays into the nose 2 (two) times daily. )    [DISCONTINUED] fluticasone  furoate-vilanterol (BREO ELLIPTA ) 200-25 MCG/INH AEPB Inhale 1 puff into the lungs daily.    [DISCONTINUED] levothyroxine  (SYNTHROID ) 75 MCG tablet TAKE 1 TABLET BY MOUTH EVERY DAY ON AN EMPTY STOMACH    [DISCONTINUED] montelukast (SINGULAIR) 10 MG tablet TAKE 1 TABLET BY MOUTH IN THE EVENING    [DISCONTINUED] Multiple Vitamin (MULTIVITAMIN) tablet Take 1 tablet by mouth daily.    [DISCONTINUED] mupirocin  ointment (BACTROBAN ) 2 % Apply topically 2 (two) times daily. (Patient taking differently: Apply 1 application topically 2 (two) times daily as needed (rash). )    [  DISCONTINUED] beclomethasone (QVAR  REDIHALER) 80 MCG/ACT inhaler Inhale 2 puffs into the lungs 2 (two) times daily. (Patient not taking: Reported on 09/06/2019)    [DISCONTINUED] ipratropium (ATROVENT ) 0.02 % nebulizer solution Take 1.25 mLs (0.25 mg total) by nebulization every 4 (four) hours as needed for wheezing or shortness of breath.    [DISCONTINUED] pantoprazole  (PROTONIX ) 40 MG tablet Take 1 tablet (40 mg total) by mouth daily.    No facility-administered encounter medications on file as of 08/17/2019.      Objective:   Vitals:   08/17/19 0942  BP:  120/78  Pulse: 63  Temp: 97.6 F (36.4 C)  Height: 5' 7 (1.702 m)  Weight: 139 lb 3.2 oz (63.1 kg)  SpO2: 98%  TempSrc: Temporal  BMI (Calculated): 21.8     Physical exam documentation is limited by delayed entry of information.

## 2024-07-28 ENCOUNTER — Other Ambulatory Visit: Payer: Self-pay | Admitting: Internal Medicine

## 2024-07-28 DIAGNOSIS — J4520 Mild intermittent asthma, uncomplicated: Secondary | ICD-10-CM | POA: Diagnosis not present

## 2024-07-28 DIAGNOSIS — I2089 Other forms of angina pectoris: Secondary | ICD-10-CM

## 2024-07-28 DIAGNOSIS — R079 Chest pain, unspecified: Secondary | ICD-10-CM

## 2024-07-28 DIAGNOSIS — R0789 Other chest pain: Secondary | ICD-10-CM | POA: Diagnosis not present

## 2024-07-28 DIAGNOSIS — R000 Tachycardia, unspecified: Secondary | ICD-10-CM | POA: Diagnosis not present

## 2024-07-28 DIAGNOSIS — R0602 Shortness of breath: Secondary | ICD-10-CM | POA: Diagnosis not present

## 2024-07-28 DIAGNOSIS — K219 Gastro-esophageal reflux disease without esophagitis: Secondary | ICD-10-CM | POA: Diagnosis not present

## 2024-07-28 DIAGNOSIS — R002 Palpitations: Secondary | ICD-10-CM | POA: Diagnosis not present

## 2024-07-30 ENCOUNTER — Emergency Department (HOSPITAL_COMMUNITY)

## 2024-07-30 ENCOUNTER — Encounter (HOSPITAL_COMMUNITY): Payer: Self-pay

## 2024-07-30 ENCOUNTER — Other Ambulatory Visit: Payer: Self-pay

## 2024-07-30 ENCOUNTER — Emergency Department (HOSPITAL_COMMUNITY)
Admission: EM | Admit: 2024-07-30 | Discharge: 2024-07-30 | Disposition: A | Discharge status: Home / Self Care | Attending: Emergency Medicine | Admitting: Emergency Medicine

## 2024-07-30 DIAGNOSIS — I2510 Atherosclerotic heart disease of native coronary artery without angina pectoris: Secondary | ICD-10-CM | POA: Insufficient documentation

## 2024-07-30 DIAGNOSIS — R1084 Generalized abdominal pain: Secondary | ICD-10-CM | POA: Diagnosis not present

## 2024-07-30 DIAGNOSIS — J45909 Unspecified asthma, uncomplicated: Secondary | ICD-10-CM | POA: Diagnosis not present

## 2024-07-30 DIAGNOSIS — K5730 Diverticulosis of large intestine without perforation or abscess without bleeding: Secondary | ICD-10-CM | POA: Diagnosis not present

## 2024-07-30 DIAGNOSIS — I700 Atherosclerosis of aorta: Secondary | ICD-10-CM | POA: Diagnosis not present

## 2024-07-30 DIAGNOSIS — R1024 Suprapubic pain: Secondary | ICD-10-CM | POA: Insufficient documentation

## 2024-07-30 DIAGNOSIS — R1031 Right lower quadrant pain: Secondary | ICD-10-CM | POA: Insufficient documentation

## 2024-07-30 DIAGNOSIS — R1111 Vomiting without nausea: Secondary | ICD-10-CM | POA: Diagnosis not present

## 2024-07-30 DIAGNOSIS — R9431 Abnormal electrocardiogram [ECG] [EKG]: Secondary | ICD-10-CM | POA: Diagnosis not present

## 2024-07-30 DIAGNOSIS — R110 Nausea: Secondary | ICD-10-CM | POA: Diagnosis not present

## 2024-07-30 DIAGNOSIS — R197 Diarrhea, unspecified: Secondary | ICD-10-CM | POA: Insufficient documentation

## 2024-07-30 LAB — COMPREHENSIVE METABOLIC PANEL WITH GFR
ALT: 11 U/L (ref 0–44)
AST: 25 U/L (ref 15–41)
Albumin: 3.9 g/dL (ref 3.5–5.0)
Alkaline Phosphatase: 60 U/L (ref 38–126)
Anion gap: 8 (ref 5–15)
BUN: 18 mg/dL (ref 8–23)
CO2: 26 mmol/L (ref 22–32)
Calcium: 8.3 mg/dL — ABNORMAL LOW (ref 8.9–10.3)
Chloride: 106 mmol/L (ref 98–111)
Creatinine, Ser: 0.62 mg/dL (ref 0.44–1.00)
GFR, Estimated: >60 mL/min (ref >60)
Glucose, Bld: 102 mg/dL — ABNORMAL HIGH (ref 70–99)
Potassium: 3.7 mmol/L (ref 3.5–5.1)
Sodium: 140 mmol/L (ref 135–145)
Total Bilirubin: 0.5 mg/dL (ref 0.0–1.2)
Total Protein: 5.8 g/dL — ABNORMAL LOW (ref 6.5–8.1)

## 2024-07-30 LAB — CBC WITH DIFFERENTIAL/PLATELET
Abs Immature Granulocytes: 0.03 K/uL (ref 0.00–0.07)
Basophils Absolute: 0 K/uL (ref 0.0–0.1)
Basophils Relative: 0 %
Eosinophils Absolute: 0 K/uL (ref 0.0–0.5)
Eosinophils Relative: 0 %
HCT: 42.4 % (ref 36.0–46.0)
Hemoglobin: 13.8 g/dL (ref 12.0–15.0)
Immature Granulocytes: 0 %
Lymphocytes Relative: 4 %
Lymphs Abs: 0.4 K/uL — ABNORMAL LOW (ref 0.7–4.0)
MCH: 31 pg (ref 26.0–34.0)
MCHC: 32.5 g/dL (ref 30.0–36.0)
MCV: 95.3 fL (ref 80.0–100.0)
Monocytes Absolute: 0.5 K/uL (ref 0.1–1.0)
Monocytes Relative: 5 %
Neutro Abs: 8.7 K/uL — ABNORMAL HIGH (ref 1.7–7.7)
Neutrophils Relative %: 91 %
Platelets: 250 K/uL (ref 150–400)
RBC: 4.45 MIL/uL (ref 3.87–5.11)
RDW: 12 % (ref 11.5–15.5)
WBC: 9.7 K/uL (ref 4.0–10.5)
nRBC: 0 % (ref 0.0–0.2)

## 2024-07-30 LAB — LACTIC ACID, PLASMA: Lactic Acid, Venous: 0.9 mmol/L (ref 0.5–1.9)

## 2024-07-30 LAB — LIPASE, BLOOD: Lipase: 26 U/L (ref 11–51)

## 2024-07-30 MED ORDER — IOHEXOL 300 MG/ML  SOLN
100.0000 mL | Freq: Once | INTRAMUSCULAR | Status: AC | PRN
  Administered 2024-07-30: 100 mL via INTRAVENOUS

## 2024-07-30 MED ORDER — ONDANSETRON HCL 4 MG/2ML IJ SOLN
4.0000 mg | Freq: Four times a day (QID) | INTRAMUSCULAR | Status: DC | PRN
  Filled 2024-07-30: qty 2

## 2024-07-30 MED ORDER — KETOROLAC TROMETHAMINE 15 MG/ML IJ SOLN
15.0000 mg | Freq: Once | INTRAMUSCULAR | Status: AC
Start: 2024-07-30 — End: 2024-07-30
  Administered 2024-07-30: 15 mg via INTRAVENOUS
  Filled 2024-07-30: qty 1

## 2024-07-30 MED ORDER — DICYCLOMINE HCL 20 MG PO TABS: 20.0000 mg | ORAL_TABLET | Freq: Two times a day (BID) | ORAL | 0 refills | Status: DC | PRN

## 2024-07-30 MED ORDER — MORPHINE SULFATE (PF) 2 MG/ML IV SOLN
2.0000 mg | INTRAVENOUS | Status: DC | PRN
  Administered 2024-07-30: 2 mg via INTRAVENOUS
  Filled 2024-07-30: qty 1

## 2024-07-30 MED ORDER — LACTATED RINGERS IV BOLUS
1000.0000 mL | Freq: Once | INTRAVENOUS | Status: AC
  Administered 2024-07-30: 1000 mL via INTRAVENOUS

## 2024-07-30 MED ORDER — DICYCLOMINE HCL 10 MG PO CAPS
10.0000 mg | ORAL_CAPSULE | Freq: Once | ORAL | Status: AC
  Administered 2024-07-30: 10 mg via ORAL
  Filled 2024-07-30: qty 1

## 2024-07-30 NOTE — Discharge Instructions (Addendum)
 Kimberly Mercer  Thank you for allowing us  to take care of you today.  You came to the Emergency Department today because you are having crampy abdominal pain and frequent stools overnight.  Here in the emergency department your blood counts and blood salts were reassuring, your pancreas, liver, kidney numbers were normal, and your CT did not show any signs of inflammation or infection of any of your organs.  It is not entirely clear what is causing your symptoms, however your workup is reassuring against emergency causes that would require hospitalization.  We recommend taking Bentyl as needed for abdominal pain up to twice a day, you can also use Imodium to help decrease the frequency of your stools, we recommend following up closely with your gastroenterologist.  To-Do: 1. Please follow-up with your primary doctor within 1 to 2 weeks/ as soon as possible.   Please return to the Emergency Department or call 911 if you experience have worsening of your symptoms, or do not get better, chest pain, shortness of breath, severe or significantly worsening pain, high fever, severe confusion, pass out or have any reason to think that you need emergency medical care.   We hope you feel better soon.   Mitzie Later, MD Department of Emergency Medicine [facility] [place]

## 2024-07-30 NOTE — ED Triage Notes (Signed)
 Pt BIB EMS from Home due to 9/10 RLQ abdominal pain and diarrhea and nausea. Last night pt had a sudden episode of abdominal pain and diarrhea, nausea. This morning pain worsened rebound tenderness. Hx of IBS and GERD.  20 ga IV LAC 20 mg Zofran  100 mcg Fentanyl 

## 2024-07-30 NOTE — ED Provider Notes (Signed)
 [place] EMERGENCY DEPARTMENT AT [facility] Provider Note   CSN: [id number] Arrival date & time: 07/30/24  1431     History Chief Complaint  Patient presents with   Abdominal Pain    HPI: Kimberly Mercer is a 78 y.o. female with history pertinent for IBS, prior GI candidiasis, anxiety, osteoarthritis, GERD, asthma CAD who presents complaining of abdominal pain. Patient arrived via EMS accompanied by husband, JC.  History provided by patient.  No interpreter required during this encounter.  Patient reports that she was in her baseline state of health last night.  Reports that she woke up overnight and had numerous episodes of watery bowel movements.  Reports that this lasted for approximately an hour, and thereafter the consistency of the stool solidified, however she still had significant tenesmus and increased stool frequency.  Reports that she had partial improvement, however symptoms recurred later this morning and were severe, associated with dry heaving, and sensation of shortness of breath which she attributes to abdominal pain.  Reports that pain was partially improved by assuming a fetal position, worsened by lying flat.  No other specific aggravating or alleviating factors.  Reports that she called EMS, and was provided medication en route (fentanyl , Zofran  per triage note), and had significant improvement of pain.  Reports that she is having recurrent increased cramping in her abdomen today, therefore she is concerned that she is about to have recurrent severe pain and increased frequency of bowel movements.  Reports that the pain is primarily in her right lower quadrant and radiates to her suprapubic area as well as left lower quadrant.  Patient's recorded medical, surgical, social, medication list and allergies were reviewed in the Snapshot window as part of the initial history.   Prior to Admission medications   Medication Sig Start Date End Date Taking? Authorizing  Provider  dicyclomine (BENTYL) 20 MG tablet Take 1 tablet (20 mg total) by mouth 2 (two) times daily as needed for up to 5 days for spasms. 07/30/24 08/04/24 Yes Rogelia Jerilynn RAMAN, MD  acetaminophen  (TYLENOL ) 325 MG tablet Take by mouth.    [provider]  albuterol  (PROVENTIL ) (2.5 MG/3ML) 0.083% nebulizer solution Take 3 mLs (2.5 mg total) by nebulization every 6 (six) hours as needed for wheezing or shortness of breath. Dx:J45.909 09/21/19   Tamea Dedra CROME, MD  albuterol  (VENTOLIN  HFA) 108 [phone] Base) MCG/ACT inhaler INHALE 2 PUFFS BY MOUTH EVERY 6 HOURS AS NEEDED FOR WHEEZING OR SHORTNESS OF BREATH 11/11/23   Gasper Nancyann BRAVO, MD  Calcium -Magnesium-Zinc (CAL-MAG-ZINC PO) Take 1 tablet by mouth daily.    [provider]  CANNABIDIOL PO Take 30 mg by mouth daily. Capsule    [provider]  cholecalciferol  (VITAMIN D3) 25 MCG (1000 UNIT) tablet Take 1,000 Units by mouth daily.    [provider]  cyanocobalamin (VITAMIN B12) 1000 MCG tablet Take 500 mcg by mouth daily.    [provider]  diclofenac sodium (VOLTAREN) 1 % GEL Apply 2 g topically 4 (four) times daily as needed (pain.). 03/31/18   [provider]  diphenhydrAMINE (BENADRYL) 25 MG tablet Take 25 mg by mouth every 6 (six) hours as needed (allergies.).    [provider]  famotidine  (PEPCID ) 20 MG tablet Take by mouth.    [provider]  fluconazole  (DIFLUCAN ) 150 MG tablet Take 1 tablet (150 mg total) by mouth every 3 (three) days as needed (for vaginal itching/yeast infection sx). 01/01/24   Gareth Clarity  F, FNP  fluconazole  (DIFLUCAN ) 200 MG tablet Take 1 tablet (200 mg total) by mouth every other day. Patient not taking: Reported on 06/11/2024 09/15/23   Emilio Kelly DASEN, FNP  latanoprost (XALATAN) 0.005 % ophthalmic solution 1 drop at bedtime. 06/07/22   [provider]  levothyroxine  (SYNTHROID ) 50 MCG tablet TAKE ONE TABLET BY MOUTH EVERY DAY BEFORE  BREAKFAST 06/25/24   Gasper Nancyann BRAVO, MD  montelukast (SINGULAIR) 10 MG tablet TAKE ONE TABLET BY MOUTH EVERY EVENING 01/01/24   Gasper Nancyann BRAVO, MD  Multiple Vitamin (MULTIVITAMIN WITH MINERALS) TABS tablet Take 1 tablet by mouth daily.    [provider]  NONFORMULARY OR COMPOUNDED ITEM Estriole 1mg /gm vaginal cream Apply 0.5gm vaginally daily 11/13/23   Gasper Nancyann BRAVO, MD  omeprazole  (PRILOSEC) 40 MG capsule Take 1 capsule (40 mg total) by mouth 2 (two) times daily before a meal. Patient taking differently: Take 40 mg by mouth 2 (two) times daily as needed. 11/10/23   Vanga, Rohini Reddy, MD  pseudoephedrine (SUDAFED) 60 MG tablet Take 30 mg by mouth every morning.    [provider]  Spacer/Aero-Holding Chambers (AEROCHAMBER MV) inhaler Use as instructed 09/06/19   Tamea Dedra CROME, MD  vitamin E 180 MG (400 UNITS) capsule Take 400 Units by mouth daily.     [provider]     Allergies: Claritin [loratadine], Fructose, Gluten meal, Ipratropium, Lactose, Levaquin [levofloxacin], Sulfa drugs cross reactors, and Tilactase   Review of Systems   ROS as per HPI  Physical Exam Updated Vital Signs BP 139/73 (BP Location: Left Arm)   Pulse 83   Temp 98.7 F (37.1 C) (Oral)   Resp 13   SpO2 98%  Physical Exam Vitals and nursing note reviewed.  Constitutional:      General: She is not in acute distress.    Appearance: She is well-developed.  HENT:     Head: Normocephalic and atraumatic.  Eyes:     Conjunctiva/sclera: Conjunctivae normal.  Cardiovascular:     Rate and Rhythm: Normal rate and regular rhythm.     Heart sounds: No murmur heard. Pulmonary:     Effort: Pulmonary effort is normal. No respiratory distress.     Breath sounds: Normal breath sounds.  Abdominal:     Palpations: Abdomen is soft.     Tenderness: There is abdominal tenderness in the right lower quadrant and suprapubic area. There is no guarding or rebound. Negative signs include  McBurney's sign.  Musculoskeletal:        General: No swelling.     Cervical back: Neck supple.  Skin:    General: Skin is warm and dry.     Capillary Refill: Capillary refill takes less than 2 seconds.  Neurological:     Mental Status: She is alert.  Psychiatric:        Mood and Affect: Mood normal.     ED Course/ Medical Decision Making/ A&P    Procedures Procedures   Medications Ordered in ED Medications  lactated ringers  bolus 1,000 mL (0 mLs Intravenous Stopped 07/30/24 1906)  dicyclomine (BENTYL) capsule 10 mg (10 mg Oral Given 07/30/24 1905)  iohexol  (OMNIPAQUE ) 300 MG/ML solution 100 mL (100 mLs Intravenous Contrast Given 07/30/24 1847)  ketorolac (TORADOL) 15 MG/ML injection 15 mg (15 mg Intravenous Given 07/30/24 2035)    Medical Decision Making:   CHONDA BANEY is a 78 y.o. female who presents for abdominal pain as per above.  Physical exam is pertinent for tenderness  to palpation in the right lower quadrant and suprapubic area.   The differential includes but is not limited to IBS, IBD, enteritis, gastroenteritis, appendicitis, functional pain, mesenteric ischemia.  Independent historian: None  External data reviewed: No pertinent external data  Labs: Ordered, Independent interpretation, and Details: CBC without leukocytosis, anemia, thrombocytopenia.  Lipase WNL.  Lactic acid WNL.  CMP without AKI, emergent electrolyte derangement, emergent LFT abnormality.   Radiology: Ordered, Independent interpretation, Details: CT without free air, significant free fluid, focal fat stranding/hyperenhancement, focal fluid collection, and All images reviewed independently.  Agree with radiology report at this time.   CT ABDOMEN PELVIS W CONTRAST Result Date: 07/30/2024 CLINICAL DATA:  Right lower quadrant pain EXAM: CT ABDOMEN AND PELVIS WITH CONTRAST TECHNIQUE: Multidetector CT imaging of the abdomen and pelvis was performed using the standard protocol following bolus  administration of intravenous contrast. RADIATION DOSE REDUCTION: This exam was performed according to the departmental dose-optimization program which includes automated exposure control, adjustment of the mA and/or kV according to patient size and/or use of iterative reconstruction technique. CONTRAST:  OMNIPAQUE  IOHEXOL  300 MG/ML  SOLN COMPARISON:  11/17/2020 FINDINGS: Lower chest: Dependent atelectasis or scarring in the lower lobes. No effusions. Hepatobiliary: Small scattered hypodensities in the liver compatible with cysts, the largest in the right hepatic lobe measuring 9 mm. Gallbladder unremarkable. Pancreas: No focal abnormality or ductal dilatation. Spleen: No focal abnormality.  Normal size. Adrenals/Urinary Tract: No adrenal abnormality. No focal renal abnormality. No stones or hydronephrosis. Urinary bladder is unremarkable. Stomach/Bowel: Sigmoid diverticulosis. No active diverticulitis. Stomach and small bowel decompressed, unremarkable. Appendix not definitively seen. No pericecal inflammation. Vascular/Lymphatic: No evidence of aneurysm or adenopathy. Aortic atherosclerosis. Reproductive: Prior hysterectomy.  No adnexal masses. Other: No free fluid or free air. Musculoskeletal: No acute bony abnormality. IMPRESSION: No acute findings in the abdomen or pelvis. Sigmoid diverticulosis. Electronically Signed   By: Franky Crease M.D.   On: 07/30/2024 19:13    EKG/Medicine tests: Ordered and Independent interpretation EKG Interpretation: Sinus rhythm Probable left atrial enlargement Nonspecific T abnormalities, lateral leads Confirmed by Rogelia Satterfield ([id number]) on 07/30/2024 8:06:19 PM  Interventions: LR bolus, morphine, Zofran , Bentyl  See the EMR for full details regarding lab and imaging results.  Patient presents for crampy abdominal pain and diarrhea, which she reports feels similar but worsened to her prior IBS.  Patient with soft abdomen, however does have tenderness in the lower  abdomen, therefore given age, focal tenderness, do feel that labs and imaging are indicated.  Lactic acid WNL, therefore doubt mesenteric ischemia, sepsis.  Patient vitally stable, afebrile, no leukocytosis, which also makes severe infection less likely.  Pancreatitis unlikely in the absence of lipase elevation.  Patient unable to be intubated in the ED, however given patient without leukocytosis, doubt UTI, no evidence of pyelonephritis on exam (no CVA tenderness) or on CT..  CT demonstrates no acute intra-abdominal process.  On reevaluation, patient states he has had improvement in her symptoms.  Given reassuring workup, do feel that patient is stable for discharge and continued outpatient management, encourage close follow-up with her gastroenterologist, and discussed return precautions.  Will discharge with short course of Bentyl, after shared decision making regarding increased risks of side effects, however given history of IBS and otherwise reassuring workup, do feel that this is the most appropriate medication for patient's symptoms.  Presentation is most consistent with acute complicated illness and I did consider and rule out acute life/limb-threatening illness  Discussion of management or test interpretations  with external provider(s): Not indicated  Risk Drugs:Prescription drug management and Parenteral controlled substances  Disposition: DISCHARGE: I believe that the patient is safe for discharge home with outpatient follow-up. Patient was informed of all pertinent physical exam, laboratory, and imaging findings. Patient's suspected etiology of their symptom presentation was discussed with the patient and all questions were answered. We discussed following up with PCP, gastroenterology. I provided thorough ED return precautions. The patient feels safe and comfortable with this plan.  MDM generated using voice dictation software and may contain dictation errors.  Please contact me for any  clarification or with any questions.  Clinical Impression:  1. Diarrhea, unspecified type      Discharge   Final Clinical Impression(s) / ED Diagnoses Final diagnoses:  Diarrhea, unspecified type    Rx / DC Orders ED Discharge Orders          Ordered    Ambulatory referral to Gastroenterology        07/30/24 2028    dicyclomine (BENTYL) 20 MG tablet  2 times daily PRN        07/30/24 2028             Rogelia Jerilynn RAMAN, MD 07/31/24 1453

## 2024-08-02 ENCOUNTER — Other Ambulatory Visit
Admission: RE | Admit: 2024-08-02 | Discharge: 2024-08-02 | Disposition: A | Discharge status: Home / Self Care | Attending: Family Medicine | Admitting: Family Medicine

## 2024-08-02 ENCOUNTER — Encounter: Payer: Self-pay | Admitting: Family Medicine

## 2024-08-02 ENCOUNTER — Ambulatory Visit: Admitting: Family Medicine

## 2024-08-02 VITALS — BP 113/61 | HR 71 | Wt 129.7 lb

## 2024-08-02 DIAGNOSIS — R109 Unspecified abdominal pain: Secondary | ICD-10-CM | POA: Diagnosis not present

## 2024-08-02 LAB — CBC WITH DIFFERENTIAL/PLATELET
Abs Immature Granulocytes: 0.01 K/uL (ref 0.00–0.07)
Basophils Absolute: 0 K/uL (ref 0.0–0.1)
Basophils Relative: 1 %
Eosinophils Absolute: 0.1 K/uL (ref 0.0–0.5)
Eosinophils Relative: 2 %
HCT: 36.6 % (ref 36.0–46.0)
Hemoglobin: 12.8 g/dL (ref 12.0–15.0)
Immature Granulocytes: 0 %
Lymphocytes Relative: 26 %
Lymphs Abs: 1.1 K/uL (ref 0.7–4.0)
MCH: 32.5 pg (ref 26.0–34.0)
MCHC: 35 g/dL (ref 30.0–36.0)
MCV: 92.9 fL (ref 80.0–100.0)
Monocytes Absolute: 0.4 K/uL (ref 0.1–1.0)
Monocytes Relative: 9 %
Neutro Abs: 2.7 K/uL (ref 1.7–7.7)
Neutrophils Relative %: 62 %
Platelets: 245 K/uL (ref 150–400)
RBC: 3.94 MIL/uL (ref 3.87–5.11)
RDW: 11.9 % (ref 11.5–15.5)
WBC: 4.3 K/uL (ref 4.0–10.5)
nRBC: 0 % (ref 0.0–0.2)

## 2024-08-02 LAB — COMPREHENSIVE METABOLIC PANEL WITH GFR
ALT: 14 U/L (ref 0–44)
AST: 22 U/L (ref 15–41)
Albumin: 3.7 g/dL (ref 3.5–5.0)
Alkaline Phosphatase: 55 U/L (ref 38–126)
Anion gap: 9 (ref 5–15)
BUN: 16 mg/dL (ref 8–23)
CO2: 28 mmol/L (ref 22–32)
Calcium: 8.7 mg/dL — ABNORMAL LOW (ref 8.9–10.3)
Chloride: 103 mmol/L (ref 98–111)
Creatinine, Ser: 0.77 mg/dL (ref 0.44–1.00)
GFR, Estimated: >60 mL/min (ref >60)
Glucose, Bld: 94 mg/dL (ref 70–99)
Potassium: 3.4 mmol/L — ABNORMAL LOW (ref 3.5–5.1)
Sodium: 140 mmol/L (ref 135–145)
Total Bilirubin: 0.4 mg/dL (ref 0.0–1.2)
Total Protein: 6.6 g/dL (ref 6.5–8.1)

## 2024-08-02 LAB — SEDIMENTATION RATE: Sed Rate: 42 mm/h — ABNORMAL HIGH (ref 0–30)

## 2024-08-02 MED ORDER — DICYCLOMINE HCL 20 MG PO TABS: 20.0000 mg | ORAL_TABLET | Freq: Two times a day (BID) | ORAL | 0 refills | Status: DC | PRN

## 2024-08-02 NOTE — Patient Instructions (Signed)
 Please review the attached list of medications and notify my office if there are any errors.   Go to the [facility] Outpatient lab to get your blood drawn. You can get to the lab through the [facility] entrance.

## 2024-08-03 ENCOUNTER — Other Ambulatory Visit: Payer: Self-pay

## 2024-08-03 ENCOUNTER — Ambulatory Visit: Payer: Self-pay | Admitting: Family Medicine

## 2024-08-03 DIAGNOSIS — R197 Diarrhea, unspecified: Secondary | ICD-10-CM | POA: Diagnosis not present

## 2024-08-03 LAB — C-REACTIVE PROTEIN: CRP: 9.4 mg/dL — ABNORMAL HIGH (ref <1.0)

## 2024-08-04 ENCOUNTER — Telehealth: Payer: Self-pay | Admitting: Family Medicine

## 2024-08-04 DIAGNOSIS — E890 Postprocedural hypothyroidism: Secondary | ICD-10-CM | POA: Diagnosis not present

## 2024-08-04 DIAGNOSIS — E042 Nontoxic multinodular goiter: Secondary | ICD-10-CM | POA: Diagnosis not present

## 2024-08-04 NOTE — Telephone Encounter (Signed)
 Sending a copy of lab bill back so Dr. Gasper can recode labs so the insurance will pay for them.

## 2024-08-10 DIAGNOSIS — K529 Noninfective gastroenteritis and colitis, unspecified: Secondary | ICD-10-CM | POA: Diagnosis not present

## 2024-08-10 DIAGNOSIS — G8929 Other chronic pain: Secondary | ICD-10-CM | POA: Diagnosis not present

## 2024-08-10 DIAGNOSIS — R1031 Right lower quadrant pain: Secondary | ICD-10-CM | POA: Diagnosis not present

## 2024-08-12 ENCOUNTER — Encounter (HOSPITAL_COMMUNITY): Payer: Self-pay

## 2024-08-16 ENCOUNTER — Ambulatory Visit
Admission: RE | Admit: 2024-08-16 | Discharge: 2024-08-16 | Disposition: A | Discharge status: Home / Self Care | Source: Ambulatory Visit | Attending: Internal Medicine | Admitting: Internal Medicine

## 2024-08-16 DIAGNOSIS — R079 Chest pain, unspecified: Secondary | ICD-10-CM | POA: Diagnosis not present

## 2024-08-16 DIAGNOSIS — R0602 Shortness of breath: Secondary | ICD-10-CM | POA: Diagnosis not present

## 2024-08-16 DIAGNOSIS — I2089 Other forms of angina pectoris: Secondary | ICD-10-CM | POA: Diagnosis not present

## 2024-08-16 DIAGNOSIS — R0789 Other chest pain: Secondary | ICD-10-CM | POA: Diagnosis not present

## 2024-08-16 MED ORDER — NITROGLYCERIN 0.4 MG SL SUBL
0.8000 mg | SUBLINGUAL_TABLET | Freq: Once | SUBLINGUAL | Status: AC
  Administered 2024-08-16: 0.8 mg via SUBLINGUAL
  Filled 2024-08-16: qty 25

## 2024-08-16 MED ORDER — IOHEXOL 350 MG/ML SOLN
200.0000 mL | Freq: Once | INTRAVENOUS | Status: AC | PRN
  Administered 2024-08-16: 200 mL via INTRAVENOUS

## 2024-08-18 NOTE — Progress Notes (Signed)
 Established patient visit   Patient: Kimberly Mercer   DOB: April 22, 1946   78 y.o. Female  MRN: [id number] Visit Date: 08/02/2024  Today's healthcare provider: Nancyann Perry, MD   Chief Complaint  Patient presents with   Follow-up    Follow up ER visit Patient was seen in ER for Diarrhea on 07/30/24. Patient she went de to pain in abdomen.   Subjective    Discussed the use of AI scribe software for clinical note transcription with the patient, who gave verbal consent to proceed.  History of Present Illness   Kimberly Mercer is a 78 year old female who presents for follow-up after an ER visit for diarrhea and abdominal pain.   Three days ago, she experienced frequent loose watery diarrhea and lower abdominal pain, which began late Thursday night into Friday. After a brief period of normal stool, the abdominal pain worsened, becoming excruciating and necessitating an EMT call and ER visit. In the ER, she received fentanyl  and morphine for pain management.  A CT scan of the abdomen and pelvis with contrast in the ER showed no acute findings. Blood work included a normal CBC, a complete metabolic panel with slightly low calcium  (8.3) and total protein (5.8). She was treated with IV fluids and dicyclomine and discharged with a prescription for dicyclomine.  Since the ER visit, she has persistent abdominal pain, bloating, and gas. The pain worsens with eating and is sometimes accompanied by spasms. She takes dicyclomine twice daily and Tylenol  for pain, which provides temporary relief. She reports a lack of appetite, early satiety, and acid indigestion. Bowel movements have been minimal, with a sensation of needing to defecate but unable to pass stool.  Her stool was pale for three to four days prior to the ER visit but returned to normal color. No blood in the stool or melena. She experienced shortness of breath during the pain episodes but denies current nausea or vomiting. Her  urine was dark for a couple of days.  She is trying to maintain hydration with water mixed with salt and sugar, as plain water is unappealing. She has difficulty eating more despite encouragement from her husband.       Medications: Outpatient Medications Prior to Visit  Medication Sig   acetaminophen  (TYLENOL ) 325 MG tablet Take by mouth.   albuterol  (PROVENTIL ) (2.5 MG/3ML) 0.083% nebulizer solution Take 3 mLs (2.5 mg total) by nebulization every 6 (six) hours as needed for wheezing or shortness of breath. Dx:J45.909   albuterol  (VENTOLIN  HFA) 108 (90 Base) MCG/ACT inhaler INHALE 2 PUFFS BY MOUTH EVERY 6 HOURS AS NEEDED FOR WHEEZING OR SHORTNESS OF BREATH   Calcium -Magnesium-Zinc (CAL-MAG-ZINC PO) Take 1 tablet by mouth daily.   CANNABIDIOL PO Take 30 mg by mouth daily. Capsule   cholecalciferol  (VITAMIN D3) 25 MCG (1000 UNIT) tablet Take 1,000 Units by mouth daily.   cyanocobalamin (VITAMIN B12) 1000 MCG tablet Take 500 mcg by mouth daily.   diclofenac sodium (VOLTAREN) 1 % GEL Apply 2 g topically 4 (four) times daily as needed (pain.).   diphenhydrAMINE (BENADRYL) 25 MG tablet Take 25 mg by mouth every 6 (six) hours as needed (allergies.).   famotidine  (PEPCID ) 20 MG tablet Take by mouth.   fluconazole  (DIFLUCAN ) 200 MG tablet Take 1 tablet (200 mg total) by mouth every other day.   latanoprost (XALATAN) 0.005 % ophthalmic solution 1 drop at bedtime.   levothyroxine  (SYNTHROID ) 50 MCG tablet TAKE ONE TABLET BY  MOUTH EVERY DAY BEFORE BREAKFAST   montelukast (SINGULAIR) 10 MG tablet TAKE ONE TABLET BY MOUTH EVERY EVENING   Multiple Vitamin (MULTIVITAMIN WITH MINERALS) TABS tablet Take 1 tablet by mouth daily.   NONFORMULARY OR COMPOUNDED ITEM Estriole 1mg /gm vaginal cream Apply 0.5gm vaginally daily   omeprazole  (PRILOSEC) 40 MG capsule Take 1 capsule (40 mg total) by mouth 2 (two) times daily before a meal. (Patient taking differently: Take 40 mg by mouth 2 (two) times daily as needed.)    pseudoephedrine (SUDAFED) 60 MG tablet Take 30 mg by mouth every morning.   Spacer/Aero-Holding Chambers (AEROCHAMBER MV) inhaler Use as instructed   vitamin E 180 MG (400 UNITS) capsule Take 400 Units by mouth daily.    [DISCONTINUED] dicyclomine (BENTYL) 20 MG tablet Take 1 tablet (20 mg total) by mouth 2 (two) times daily as needed for up to 5 days for spasms.   fluconazole  (DIFLUCAN ) 150 MG tablet Take 1 tablet (150 mg total) by mouth every 3 (three) days as needed (for vaginal itching/yeast infection sx).   No facility-administered medications prior to visit.       Objective    BP 113/61 (BP Location: Left Arm, Patient Position: Sitting, Cuff Size: Normal)   Pulse 71   Wt 129 lb 11.2 oz (58.8 kg)   SpO2 97%   BMI 20.31 kg/m   Physical Exam   General: Appearance:    Well developed, well nourished female in no acute distress  Eyes:    PERRL, conjunctiva/corneas clear, EOM's intact       Lungs:     Clear to auscultation bilaterally, respirations unlabored  Heart:    Normal heart rate. Normal rhythm. No murmurs, rubs, or gallops.    MS:   All extremities are intact.    Neurologic:   Awake, alert, oriented x 3. No apparent focal neurological defect.         Assessment & Plan       Acute gastroenteritis with abdominal pain and diarrhea Severe abdominal pain and diarrhea with slight improvement on dicyclomine and acetaminophen . CT and CBC largely unremarkable. Differential includes severe stomach virus. - Continue dicyclomine for abdominal spasms. - Continue acetaminophen  for pain. - Order repeat CBC, calcium , protein levels, and inflammatory markers. - Perform stool studies as ordered by Dr. Unk. - Send dicyclomine refill to [facility].         Nancyann Perry, MD  [facility] [phone] (phone) [phone] (fax)  [facility]

## 2024-08-19 ENCOUNTER — Ambulatory Visit: Admit: 2024-08-19 | Admitting: Gastroenterology

## 2024-08-19 SURGERY — COLONOSCOPY
Anesthesia: General

## 2024-08-30 NOTE — Telephone Encounter (Signed)
 I called Delon Blush, [facility] rep and had codes I25.10, E83.19 and E07.9 added to patients bill from Jun 13, 2024. Also emailed the codes to [place] for reprocessing of claim.   Copied from CRM [id number]. Topic: General - Billing Inquiry >> Aug 30, 2024 10:13 AM Myrick T wrote: Reason for CRM: patient stated she dropped off a copy of a [facility] bill off on 10/15 to be recoded for pymt. Patient is still getting bills for [facility] June 13, 2024 Medicare claim#[id number] [facility] apoo#[id number]. Denial was medical necessity. Per telephone encounter the bill was sent back to [name] for recoding but no further notation. Please f/u with patient as to where things are in the process.

## 2024-09-01 NOTE — Telephone Encounter (Signed)
 [facility] has a form that needs to be filled out to add additional diagnoses codes, but they never sent a form for this order. Please contact [facility] to have them send a form for labs ordered 06/11/2024

## 2024-09-06 ENCOUNTER — Encounter: Payer: Self-pay | Admitting: Family Medicine

## 2024-09-06 ENCOUNTER — Ambulatory Visit (INDEPENDENT_AMBULATORY_CARE_PROVIDER_SITE_OTHER): Admitting: Family Medicine

## 2024-09-06 VITALS — BP 109/67 | HR 64 | Temp 97.7°F | Wt 126.0 lb

## 2024-09-06 DIAGNOSIS — R911 Solitary pulmonary nodule: Secondary | ICD-10-CM | POA: Insufficient documentation

## 2024-09-06 DIAGNOSIS — Z8249 Family history of ischemic heart disease and other diseases of the circulatory system: Secondary | ICD-10-CM | POA: Diagnosis not present

## 2024-09-06 DIAGNOSIS — K7689 Other specified diseases of liver: Secondary | ICD-10-CM | POA: Diagnosis not present

## 2024-09-06 DIAGNOSIS — K529 Noninfective gastroenteritis and colitis, unspecified: Secondary | ICD-10-CM | POA: Diagnosis not present

## 2024-09-06 DIAGNOSIS — I2510 Atherosclerotic heart disease of native coronary artery without angina pectoris: Secondary | ICD-10-CM | POA: Insufficient documentation

## 2024-09-06 DIAGNOSIS — E079 Disorder of thyroid, unspecified: Secondary | ICD-10-CM

## 2024-09-06 NOTE — Progress Notes (Signed)
 Established patient visit   Patient: Kimberly Mercer   DOB: 1945-11-25   78 y.o. Female  MRN: [id number] Visit Date: 09/06/2024  Today's healthcare provider: Nancyann Perry, MD   Chief Complaint  Patient presents with   Follow-up    Pt would like to discuss her CT results and iron levels.    Subjective    Discussed the use of AI scribe software for clinical note transcription with the patient, who gave verbal consent to proceed.  History of Present Illness   Kimberly Mercer is a 78 year old female who presents with ongoing fatigue and gastrointestinal recovery following a norovirus infection.  She has experienced significant improvement in her gastrointestinal symptoms over the past week after a prolonged illness due to norovirus. Initially, she had a severe lack of appetite and was unable to eat regular meals, leading to a weight loss to 120 pounds. Recently, her appetite has returned, allowing her to consume regular dinner portions twice a day, and her weight has increased to 123 pounds. She has been cautious with her diet, avoiding gluten, lactose, red meat, and fortified grains. She experienced one episode of diarrhea after consuming steak but otherwise reports normal stool consistency.  She continues to experience significant fatigue and reports that her husband has commented on how tired she appears. She often falls asleep when sitting still, although her energy levels have slightly improved with increased food intake. She wants to not feel so tired.  She mentions a history of thyroid  nodules, with a previous biopsy revealing one cyst and one benign nodule, while a third nodule was missed. She is uncertain about the follow-up plan, having received conflicting advice from different doctors regarding the timing of her next evaluation.  She underwent a lung scan which revealed two 4mm nodules in the lungs and changes in the liver described as cysts. She is awaiting further  interpretation of these findings.  She has a family history of heart problems, with both parents and a brother who required a quadruple bypass. She has experienced chest tightness and fatigue, particularly when using her arms, and has a history of high triglycerides. She underwent a coronary calcium  score test, which showed a score of 72, indicating average risk for her age.  Her brother has been diagnosed with a genetic form of ataxia, which he initially attributed to a fall. This condition has improved with medication typically used for multiple sclerosis.     Lab Results  Component Value Date   CHOL 193 07/09/2021   HDL 75 07/09/2021   LDLCALC 100 (H) 07/09/2021   TRIG 99 07/09/2021   CHOLHDL 2.6 07/09/2021    Lab Results  Component Value Date   IRON 180 (H) 06/11/2024   TIBC 269 06/11/2024   FERRITIN 202 (H) 06/11/2024    Medications: Outpatient Medications Prior to Visit  Medication Sig   acetaminophen  (TYLENOL ) 325 MG tablet Take by mouth.   albuterol  (PROVENTIL ) (2.5 MG/3ML) 0.083% nebulizer solution Take 3 mLs (2.5 mg total) by nebulization every 6 (six) hours as needed for wheezing or shortness of breath. Dx:J45.909   albuterol  (VENTOLIN  HFA) 108 (90 Base) MCG/ACT inhaler INHALE 2 PUFFS BY MOUTH EVERY 6 HOURS AS NEEDED FOR WHEEZING OR SHORTNESS OF BREATH   Calcium -Magnesium-Zinc (CAL-MAG-ZINC PO) Take 1 tablet by mouth daily.   CANNABIDIOL PO Take 30 mg by mouth daily. Capsule   cholecalciferol  (VITAMIN D3) 25 MCG (1000 UNIT) tablet Take 1,000 Units by mouth daily.  cyanocobalamin (VITAMIN B12) 1000 MCG tablet Take 500 mcg by mouth daily.   diclofenac sodium (VOLTAREN) 1 % GEL Apply 2 g topically 4 (four) times daily as needed (pain.).   diphenhydrAMINE (BENADRYL) 25 MG tablet Take 25 mg by mouth every 6 (six) hours as needed (allergies.).   famotidine  (PEPCID ) 20 MG tablet Take by mouth.   fluconazole  (DIFLUCAN ) 150 MG tablet Take 1 tablet (150 mg total) by mouth every 3  (three) days as needed (for vaginal itching/yeast infection sx).   latanoprost (XALATAN) 0.005 % ophthalmic solution 1 drop at bedtime.   levothyroxine  (SYNTHROID ) 50 MCG tablet TAKE ONE TABLET BY MOUTH EVERY DAY BEFORE BREAKFAST   montelukast (SINGULAIR) 10 MG tablet TAKE ONE TABLET BY MOUTH EVERY EVENING   Multiple Vitamin (MULTIVITAMIN WITH MINERALS) TABS tablet Take 1 tablet by mouth daily.   NONFORMULARY OR COMPOUNDED ITEM Estriole 1mg /gm vaginal cream Apply 0.5gm vaginally daily   omeprazole  (PRILOSEC) 40 MG capsule Take 1 capsule (40 mg total) by mouth 2 (two) times daily before a meal. (Patient taking differently: Take 40 mg by mouth 2 (two) times daily as needed.)   pseudoephedrine (SUDAFED) 60 MG tablet Take 30 mg by mouth every morning.   Spacer/Aero-Holding Chambers (AEROCHAMBER MV) inhaler Use as instructed   vitamin E 180 MG (400 UNITS) capsule Take 400 Units by mouth daily.    dicyclomine (BENTYL) 20 MG tablet Take 1 tablet (20 mg total) by mouth 2 (two) times daily as needed for spasms. (Patient not taking: Reported on 09/06/2024)   fluconazole  (DIFLUCAN ) 200 MG tablet Take 1 tablet (200 mg total) by mouth every other day. (Patient not taking: Reported on 09/06/2024)   No facility-administered medications prior to visit.   Review of Systems  Constitutional:  Positive for appetite change and fatigue. Negative for chills and fever.  Respiratory:  Negative for chest tightness and shortness of breath.   Cardiovascular:  Negative for chest pain and palpitations.  Gastrointestinal:  Negative for abdominal pain, nausea and vomiting.  Neurological:  Negative for dizziness and weakness.       Objective    BP 109/67   Pulse 64   Temp 97.7 F (36.5 C)   Wt 126 lb (57.2 kg)   SpO2 99%   BMI 19.73 kg/m   Physical Exam   General appearance: Thin female, cooperative and in no acute distress Head: Normocephalic, without obvious abnormality, atraumatic Respiratory: Respirations  even and unlabored, normal respiratory rate Extremities: All extremities are intact.  Skin: Skin color, texture, turgor normal. No rashes seen  Psych: Appropriate mood and affect. Neurologic: Mental status: Alert, oriented to person, place, and time, thought content appropriate.     Assessment & Plan    1. Mild coronary artery disease (Primary) Per recent CAC score of 72. Has follow up scheduled with Dr. Florencio in December.   2. Coronary artery calcification  - Lipid panel - Apolipoprotein B - Lipoprotein A (LPA)  3. Family history of coronary artery disease Discussed initiating statin after above labs are complete.   4. Incidental lung nodule Anticipate repeat CT in October 2026  5. Iron overload  - Fe+TIBC+Fer  6. Thyroid  disease  - TSH + free T4  She is concerned about recent biopsy and apparently only able to get samples from 2 of three nodules. Unclear if this was the concerning nodule. Will review records and consider repeating ultrasound in February 2026.   7. Simple hepatic cyst Discussed benign nature of this finding on recent  CT   8. Gastroenteritis Secondary to norovirus, GI symptoms recently resolved but remains very fatigued.        Nancyann Perry, MD  [facility] [phone] (phone) [phone] (fax)  [facility]

## 2024-09-08 LAB — IRON,TIBC AND FERRITIN PANEL
Ferritin: 214 ng/mL — ABNORMAL HIGH (ref 15–150)
Iron Saturation: 39 % (ref 15–55)
Iron: 102 ug/dL (ref 27–139)
Total Iron Binding Capacity: 261 ug/dL (ref 250–450)
UIBC: 159 ug/dL (ref 118–369)

## 2024-09-08 LAB — TSH+FREE T4
Free T4: 1.19 ng/dL (ref 0.82–1.77)
TSH: 1.53 u[IU]/mL (ref 0.450–4.500)

## 2024-09-08 LAB — LIPID PANEL
Chol/HDL Ratio: 2.4 ratio (ref 0.0–4.4)
Cholesterol, Total: 181 mg/dL (ref 100–199)
HDL: 75 mg/dL (ref >39)
LDL Chol Calc (NIH): 91 mg/dL (ref 0–99)
Triglycerides: 84 mg/dL (ref 0–149)
VLDL Cholesterol Cal: 15 mg/dL (ref 5–40)

## 2024-09-08 LAB — APOLIPOPROTEIN B: Apolipoprotein B: 74 mg/dL (ref <90)

## 2024-09-08 LAB — LIPOPROTEIN A (LPA): Lipoprotein (a): 20.6 nmol/L (ref <75.0)

## 2024-09-09 ENCOUNTER — Ambulatory Visit: Payer: Self-pay | Admitting: Family Medicine

## 2024-09-09 NOTE — Telephone Encounter (Signed)
 Iron overload E83.19 GERD K21.9  Thanks!

## 2024-09-24 ENCOUNTER — Ambulatory Visit: Payer: Self-pay

## 2024-09-24 DIAGNOSIS — R002 Palpitations: Secondary | ICD-10-CM | POA: Diagnosis not present

## 2024-09-24 DIAGNOSIS — R0602 Shortness of breath: Secondary | ICD-10-CM | POA: Diagnosis not present

## 2024-09-24 DIAGNOSIS — E042 Nontoxic multinodular goiter: Secondary | ICD-10-CM | POA: Diagnosis not present

## 2024-09-24 DIAGNOSIS — R0789 Other chest pain: Secondary | ICD-10-CM | POA: Diagnosis not present

## 2024-09-24 DIAGNOSIS — R000 Tachycardia, unspecified: Secondary | ICD-10-CM | POA: Diagnosis not present

## 2024-09-24 DIAGNOSIS — I2089 Other forms of angina pectoris: Secondary | ICD-10-CM | POA: Diagnosis not present

## 2024-09-24 DIAGNOSIS — J4520 Mild intermittent asthma, uncomplicated: Secondary | ICD-10-CM | POA: Diagnosis not present

## 2024-09-24 DIAGNOSIS — K219 Gastro-esophageal reflux disease without esophagitis: Secondary | ICD-10-CM | POA: Diagnosis not present

## 2024-09-24 NOTE — Telephone Encounter (Signed)
 FYI Only or Action Required?: FYI only for provider: appointment scheduled on 09/27/2024 at 8 AM and reports an area of swelling and mild soreness to a nodule the right side of her neck.  Patient was last seen in primary care on 09/06/2024 by Gasper Nancyann BRAVO, MD.  Called Nurse Triage reporting Skin Problem.  Symptoms began yesterday.  Interventions attempted: Rest, hydration, or home remedies.  Symptoms are: unchanged.  Triage Disposition: See PCP When Office is Open (Within 3 Days)  Patient/caregiver understands and will follow disposition?: Yes  Copied from CRM #[id number]. Topic: Clinical - Red Word Triage >> Sep 24, 2024  8:57 AM Delon HERO wrote: Red Word that prompted transfer to Nurse Triage: Patient is calling to report that lump that they have discussed right side is swollen and sore. In the process of being referred to new endo In [place]. Does Dr. Gasper want to see the patient? Please advise Reason for Disposition  [1] Small swelling or lump AND [2] unexplained AND [3] present > 1 week  Answer Assessment - Initial Assessment Questions Patient states she noticed increase swelling and soreness to a right sided neck nodule last night. Thyroid  nodules are an ongoing issue. Patient states patient is mild. Area of swelling is the size of her finger tip. Did scheduled patient for an appointment on Monday 09/27/2024 at 8 AM to see PCP. If PCP would rather wait and observe, patient is just needing a call back to let her know the appointment isn't needed.   1. APPEARANCE of SWELLING: What does it look like?     Swollen and sore to the touch 2. SIZE: How large is the swelling? (e.g., inches, cm; or compare to size of pinhead, tip of pen, eraser, coin, pea, grape, ping pong ball)      Tip of patient's finger 3. LOCATION: Where is the swelling located?     Right side of neck 4. ONSET: When did the swelling start?     Increase in swelling overnight 5. COLOR: What color is it?  Is there more than one color?     Normal skin color 6. PAIN: Is there any pain? If Yes, ask: How bad is the pain? (Scale 1-10; or mild, moderate, severe)       mild 7. ITCH: Does it itch? If Yes, ask: How bad is the itch?      no 8. CAUSE: What do you think caused the swelling?     Thyroid  nodules in the area 9 OTHER SYMPTOMS: Do you have any other symptoms? (e.g., fever)     No other symptoms.  Protocols used: Skin Lump or Localized Swelling-A-AH

## 2024-09-24 NOTE — Telephone Encounter (Signed)
 If it's been there more than a week and not going away then we should take a look at it.

## 2024-09-27 ENCOUNTER — Encounter: Payer: Self-pay | Admitting: Family Medicine

## 2024-09-27 ENCOUNTER — Ambulatory Visit: Admitting: Family Medicine

## 2024-09-27 VITALS — BP 117/53 | HR 58 | Resp 16 | Wt 128.0 lb

## 2024-09-27 DIAGNOSIS — Z23 Encounter for immunization: Secondary | ICD-10-CM

## 2024-09-27 DIAGNOSIS — R221 Localized swelling, mass and lump, neck: Secondary | ICD-10-CM

## 2024-09-27 MED ORDER — OMEPRAZOLE 40 MG PO CPDR: 40.0000 mg | DELAYED_RELEASE_CAPSULE | Freq: Two times a day (BID) | ORAL | 6 refills | Status: AC | PRN

## 2024-09-27 NOTE — Telephone Encounter (Signed)
 Patient was seen today.

## 2024-09-27 NOTE — Progress Notes (Signed)
 Established patient visit   Patient: Kimberly Mercer   DOB: [birth year]   78 y.o. Female  MRN: [id number] Visit Date: 09/27/2024  Today's healthcare provider: Nancyann Perry, MD   Chief Complaint  Patient presents with   Nodule   Subjective    Discussed the use of AI scribe software for clinical note transcription with the patient, who gave verbal consent to proceed.  History of Present Illness   Kimberly Mercer is a 78 year old female who presents with a sore throat and a large right sided neck lump.  On Friday (3 days ago), she awoke with a sore throat and noticed a large, palpable neck lump that appeared to have developed overnight. The lump has slightly decreased in size but remains noticeable, especially during swallowing.  The sore throat initially caused painful swallowing but has improved slightly. She attributes this improvement to sinus and allergy  issues. No fever, loss of appetite, or significant body aches are present, although she notes discomfort related to weather changes.  She has a history of thyroid  cysts on the right side, though she is uncertain about the specifics of their size and location.  She was previously seen by for this by [facility].  endocrinology, but got a referral to [facility] endocrinology in [place] for second opinion, but appointment has not yet been scheduled.       Medications: Outpatient Medications Prior to Visit  Medication Sig   acetaminophen  (TYLENOL ) 325 MG tablet Take by mouth.   albuterol  (PROVENTIL ) (2.5 MG/3ML) 0.083% nebulizer solution Take 3 mLs (2.5 mg total) by nebulization every 6 (six) hours as needed for wheezing or shortness of breath. Dx:J45.909   albuterol  (VENTOLIN  HFA) 108 (90 Base) MCG/ACT inhaler INHALE 2 PUFFS BY MOUTH EVERY 6 HOURS AS NEEDED FOR WHEEZING OR SHORTNESS OF BREATH   Calcium -Magnesium-Zinc (CAL-MAG-ZINC PO) Take 1 tablet by mouth daily.   CANNABIDIOL PO Take 30 mg by mouth daily. Capsule    cholecalciferol  (VITAMIN D3) 25 MCG (1000 UNIT) tablet Take 1,000 Units by mouth daily.   cyanocobalamin  (VITAMIN B12) 1000 MCG tablet Take 500 mcg by mouth daily.   diclofenac sodium (VOLTAREN) 1 % GEL Apply 2 g topically 4 (four) times daily as needed (pain.).   dicyclomine  (BENTYL ) 20 MG tablet Take 1 tablet (20 mg total) by mouth 2 (two) times daily as needed for spasms.   diphenhydrAMINE (BENADRYL) 25 MG tablet Take 25 mg by mouth every 6 (six) hours as needed (allergies.).   famotidine  (PEPCID ) 20 MG tablet Take by mouth.   fluconazole  (DIFLUCAN ) 150 MG tablet Take 1 tablet (150 mg total) by mouth every 3 (three) days as needed (for vaginal itching/yeast infection sx).   latanoprost (XALATAN) 0.005 % ophthalmic solution 1 drop at bedtime.   levothyroxine  (SYNTHROID ) 50 MCG tablet TAKE ONE TABLET BY MOUTH EVERY DAY BEFORE BREAKFAST   montelukast (SINGULAIR) 10 MG tablet TAKE ONE TABLET BY MOUTH EVERY EVENING   Multiple Vitamin (MULTIVITAMIN WITH MINERALS) TABS tablet Take 1 tablet by mouth daily.   NONFORMULARY OR COMPOUNDED ITEM Estriole 1mg /gm vaginal cream Apply 0.5gm vaginally daily   pseudoephedrine (SUDAFED) 60 MG tablet Take 30 mg by mouth every morning.   Spacer/Aero-Holding Chambers (AEROCHAMBER MV) inhaler Use as instructed   vitamin E 180 MG (400 UNITS) capsule Take 400 Units by mouth daily.    [DISCONTINUED] omeprazole  (PRILOSEC) 40 MG capsule Take 1 capsule (40 mg total) by mouth 2 (two) times daily before a meal. (  Patient taking differently: Take 40 mg by mouth 2 (two) times daily as needed.)   fluconazole  (DIFLUCAN ) 200 MG tablet Take 1 tablet (200 mg total) by mouth every other day. (Patient not taking: Reported on 09/06/2024)   No facility-administered medications prior to visit.   Review of Systems     Objective    BP (!) 117/53 (BP Location: Left Arm, Patient Position: Sitting, Cuff Size: Normal)   Pulse (!) 58   Resp 16   Wt 128 lb (58.1 kg)   SpO2 99%   BMI  20.05 kg/m   Physical Exam   General Appearance:    Well developed, well nourished female, alert, cooperative, in no acute distress  HENT:   throat normal without erythema or exudate and firm vague tender nodular swelling right lower neck c/with anterior cervical lymph node. No submandibular or posterior cervical nodules.   Eyes:    PERRL, conjunctiva/corneas clear, EOM's intact       Neurologic:   Awake, alert, oriented x 3. No apparent focal neurological           defect.          Assessment & Plan    1. Neck mass (Primary) Course is c/with reactive lymph nodule, most likely from viral pharyngitis. Is now nearly resolved. If sx flare back up she is to notify me for antibiotic prescription. Referral to [facility] endocrinology for evaluation of know thyroid  nodules for second opinion is already in progress. Anticipated repeat thyroid  ultrasound in January unless ordered by [facility] endocrinology.   Other orders Refill - omeprazole  (PRILOSEC) 40 MG capsule; Take 1 capsule (40 mg total) by mouth 2 (two) times daily as needed.  Dispense: 60 capsule; Refill: 6 - Flu vaccine HIGH DOSE PF(Fluzone Trivalent)      Nancyann Perry, MD  [facility] [phone] (phone) [phone] (fax)  [facility]

## 2024-10-18 ENCOUNTER — Ambulatory Visit: Payer: Self-pay

## 2024-10-18 NOTE — Telephone Encounter (Signed)
 FYI Only or Action Required?: FYI only for provider: appointment scheduled on 10/20/24 with [facility] .  Patient was last seen in primary care on 09/27/2024 by Kimberly Nancyann BRAVO, MD.  Called Nurse Triage reporting Mouth Lesions.  Symptoms began a week ago.  Interventions attempted: OTC medications: Rinsing with Peroxide baking soda, Ibuprofen, tylenol .  Symptoms are: unchanged.  Triage Disposition: See PCP When Office is Open (Within 3 Days)  Patient/caregiver understands and will follow disposition?:  Reason for Disposition  Mouth sore (ulcer) lasts > 2 weeks  Answer Assessment - Initial Assessment Questions Swollen gland previously, seen PCP, thought to be viral infection. Received flu shot and has had symptoms since. Rinsing with peroxide and baking soda, taking Tylenol . Patient has lost a lot of weight and now is not eating much due to pain and is concerned. Ears are still bothersome and sinuses, chest congestion. Tylenol  and Ibuprofen. Scheduled first available at [facility] [place].   1. LOCATION: Where is the mouth sore (ulcer) located?      Tip of tongue is red, cracked sore  2. NUMBER: How many sores are there? :     There were more on the cheeks but have subsided now its on tip of tongue  3. PAIN: Are they painful? If Yes, ask: How bad is it?  (Scale 0-10; or none, mild, moderate, severe)     Very painful, hard to eat 7-8/10  4. ONSET: When did you first notice the sore?      Last week  5. RECURRENT SYMPTOM: Have you had a mouth ulcer before? If Yes, ask: When was the last time? and What happened that time?      Denies  6. CAUSE: What do you think is causing the mouth sore?     Unsure  7. OTHER SYMPTOMS: Do you have any other symptoms? (e.g., fever, swollen lymph node)     Denies fever, but has chills, fatigue, hard to eat and drink, headache, body aches  Protocols used: Mouth Ulcers-A-AH  Copied from CRM #[id number]. Topic: Clinical - Red  Word Triage >> Oct 18, 2024  2:13 PM Charlet HERO wrote: Red Word that prompted transfer to Nurse Triage: Patient has sores in her mouth on her tongue and it is in pain. She is not able to eat and drink. Dr. Gasper

## 2024-10-20 ENCOUNTER — Ambulatory Visit

## 2024-10-20 VITALS — BP 110/70 | HR 71 | Temp 97.4°F | Ht 67.0 in | Wt 124.0 lb

## 2024-10-20 DIAGNOSIS — K1379 Other lesions of oral mucosa: Secondary | ICD-10-CM

## 2024-10-20 MED ORDER — BENZOCAINE (TOPICAL) 20 % EX AERO: 1.0000 | INHALATION_SPRAY | Freq: Four times a day (QID) | CUTANEOUS | 0 refills | Status: AC | PRN

## 2024-10-20 MED ORDER — DEXAMETHASONE 0.5 MG/5ML PO ELIX: 0.5000 mg | ORAL_SOLUTION | Freq: Three times a day (TID) | ORAL | 0 refills | Status: AC

## 2024-10-20 NOTE — Progress Notes (Signed)
 "  Subjective:   This visit was conducted in person. The patient gave informed consent to the use of Abridge AI technology to record the contents of the encounter as documented below.   Patient ID: Kimberly Mercer, female    DOB: 14-Sep-1946, 78 y.o.   MRN: [id number]   Discussed the use of AI scribe software for clinical note transcription with the patient, who gave verbal consent to proceed.  History of Present Illness Kimberly Mercer is a 78 year old female who presents with recurrent oral lesions and systemic symptoms.  She has been experiencing systemic symptoms since early November, including severe inflammation throughout her body, particularly in her gastrointestinal tract. She had severe right-sided abdominal pain, initially suspected to be appendicitis, but this was ruled out. She received fentanyl  and morphine  in the emergency room and was discharged. A stool sample taken by her gastroenterologist revealed norovirus. A colonoscopy was suggested but postponed due to her gut condition. Her gastrointestinal symptoms improved after adhering to her food allergies.  She has a history of periodic mouth sores, which previously self-resolved but have become more frequent, occurring monthly. She also reports significant weight loss, dropping from 155 to 120 pounds over several years, and struggles to regain weight.  She experiences joint aches, pain, and fatigue, which prompted a visit to a doctor three months ago. She has a history of osteoarthritis diagnosed by a rheumatologist years ago.  She has thyroid  nodules, one of which was not fully biopsied. A follow-up neck ultrasound is planned for January to assess any changes.       Review of Systems  All other systems reviewed and are negative.       Allergies[1]  Medications Ordered Prior to Encounter[2]  BP 110/70 (BP Location: Left Arm, Patient Position: Sitting, Cuff Size: Normal)   Pulse 71   Temp (!) 97.4 F (36.3 C)  (Oral)   Ht 5' 7 (1.702 m)   Wt 124 lb (56.2 kg)   SpO2 100%   BMI 19.42 kg/m   Objective:      Physical Exam MEASUREMENTS: Weight- 120. GENERAL: Alert, cooperative, well developed, no acute distress. HEAD: Normocephalic atraumatic. EYES: Extraocular movements intact BL, pupils round, equal and reactive to light BL, conjunctivae normal BL. NOSE: No congestion or rhinorrhea, mucous membranes are moist. ORAL: Erythema on central and right anterior tongue surfaces. Erythematous aphthous lesion on right anterior buccal surface. Erythematous lesion on left lower buccal surface. No oropharyngeal exudate or posterior oropharyngeal erythema.  .        Assessment & Plan:   Assessment & Plan Recurrent oral mucosal lesions Erythema and aphthous buccal and tongue lesions, recurrent, causing significant pain and difficulty eating. Possible cause is undiagnosed autoimmune conditions, for this reason, pt would benefit from ANA to screen. Persistent fatigue and body aches also raise suspicion for sytemic autoimmune condition, for this reason, rheumatology referral may be beneficial, will defer to PCP to consider these possibilities and discuss with pt.  Behcet syndrome is a remote possibility given recurrent oral lesions with other systemic sx (arthritis, GI issues), again, Rheumatology input may be useful. In the meantime, treating oral lesions as below.  - Prescribed dexamethasone elixir 5 mL, swish and spit, three times a day for 10 days. - Prescribed benzocaine oral spray for pain, up to four times a day as needed. - Recommended follow-up with primary care physician in 7-10 days for progress assessment and further workup discussion.     Follow up  in 1 week (on 10/27/2024) for Oral lesions with PCP.   Tyrease Vandeberg K Abygail Galeno, MD  10/20/2024     Contains text generated by Abridge.        [1]  Allergies Allergen Reactions   Claritin [Loratadine] Other (See Comments)    Panic attacks    Fructose Diarrhea   Gluten Meal Other (See Comments)    GI UPSET - CRAMPING   Ipratropium Nausea And Vomiting    (nebulizer solution) Nausea/indigestion   Lactose Other (See Comments)   Levaquin [Levofloxacin] Nausea Only    Yeast infection   Sulfa Drugs Cross Reactors Hives    Childhood reaction   Tilactase Other (See Comments)    Pain, bloating abdominal pain  [2]  Current Outpatient Medications on File Prior to Visit  Medication Sig Dispense Refill   acetaminophen  (TYLENOL ) 325 MG tablet Take by mouth.     albuterol  (PROVENTIL ) (2.5 MG/3ML) 0.083% nebulizer solution Take 3 mLs (2.5 mg total) by nebulization every 6 (six) hours as needed for wheezing or shortness of breath. Dx:J45.909 75 mL 12   albuterol  (VENTOLIN  HFA) 108 (90 Base) MCG/ACT inhaler INHALE 2 PUFFS BY MOUTH EVERY 6 HOURS AS NEEDED FOR WHEEZING OR SHORTNESS OF BREATH 18 g 3   Calcium -Magnesium-Zinc (CAL-MAG-ZINC PO) Take 1 tablet by mouth daily.     CANNABIDIOL PO Take 30 mg by mouth daily. Capsule     cholecalciferol  (VITAMIN D3) 25 MCG (1000 UNIT) tablet Take 1,000 Units by mouth daily.     cyanocobalamin  (VITAMIN B12) 1000 MCG tablet Take 500 mcg by mouth daily.     diclofenac sodium (VOLTAREN) 1 % GEL Apply 2 g topically 4 (four) times daily as needed (pain.).     diphenhydrAMINE (BENADRYL) 25 MG tablet Take 25 mg by mouth every 6 (six) hours as needed (allergies.).     famotidine  (PEPCID ) 20 MG tablet Take by mouth.     fluconazole  (DIFLUCAN ) 200 MG tablet Take 1 tablet (200 mg total) by mouth every other day. 7 tablet 1   latanoprost (XALATAN) 0.005 % ophthalmic solution 1 drop at bedtime.     levothyroxine  (SYNTHROID ) 50 MCG tablet TAKE ONE TABLET BY MOUTH EVERY DAY BEFORE BREAKFAST 90 tablet 3   montelukast (SINGULAIR) 10 MG tablet TAKE ONE TABLET BY MOUTH EVERY EVENING 90 tablet 3   Multiple Vitamin (MULTIVITAMIN WITH MINERALS) TABS tablet Take 1 tablet by mouth daily.     NONFORMULARY OR COMPOUNDED ITEM  Estriole 1mg /gm vaginal cream Apply 0.5gm vaginally daily 30 each 12   omeprazole  (PRILOSEC) 40 MG capsule Take 1 capsule (40 mg total) by mouth 2 (two) times daily as needed. 60 capsule 6   pseudoephedrine (SUDAFED) 60 MG tablet Take 30 mg by mouth every morning.     Spacer/Aero-Holding Chambers (AEROCHAMBER MV) inhaler Use as instructed 1 each 0   vitamin E 180 MG (400 UNITS) capsule Take 400 Units by mouth daily.      No current facility-administered medications on file prior to visit.   "

## 2024-10-20 NOTE — Patient Instructions (Signed)
 Thank you for visiting [facility] today! Here's what we talked about: - START dexamethasone elixir, hold in mouth for 5 mins before spitting. Use benzocaine spray for pain as needed.  - Follow up with PCP in 7-10 days, for re-evaluation, workup and referrals

## 2024-11-05 ENCOUNTER — Ambulatory Visit (INDEPENDENT_AMBULATORY_CARE_PROVIDER_SITE_OTHER): Admitting: Family Medicine

## 2024-11-05 ENCOUNTER — Ambulatory Visit: Admitting: Family Medicine

## 2024-11-05 ENCOUNTER — Encounter: Payer: Self-pay | Admitting: Family Medicine

## 2024-11-05 ENCOUNTER — Other Ambulatory Visit: Payer: Self-pay | Admitting: Family Medicine

## 2024-11-05 VITALS — BP 117/48 | HR 59 | Resp 16 | Ht 66.0 in | Wt 120.0 lb

## 2024-11-05 DIAGNOSIS — E041 Nontoxic single thyroid nodule: Secondary | ICD-10-CM

## 2024-11-05 DIAGNOSIS — E079 Disorder of thyroid, unspecified: Secondary | ICD-10-CM

## 2024-11-05 DIAGNOSIS — M791 Myalgia, unspecified site: Secondary | ICD-10-CM

## 2024-11-05 DIAGNOSIS — R5383 Other fatigue: Secondary | ICD-10-CM | POA: Diagnosis not present

## 2024-11-05 DIAGNOSIS — K1379 Other lesions of oral mucosa: Secondary | ICD-10-CM

## 2024-11-05 DIAGNOSIS — Z1231 Encounter for screening mammogram for malignant neoplasm of breast: Secondary | ICD-10-CM

## 2024-11-05 DIAGNOSIS — K7689 Other specified diseases of liver: Secondary | ICD-10-CM

## 2024-11-05 DIAGNOSIS — I700 Atherosclerosis of aorta: Secondary | ICD-10-CM

## 2024-11-05 DIAGNOSIS — J45909 Unspecified asthma, uncomplicated: Secondary | ICD-10-CM

## 2024-11-05 MED ORDER — CELECOXIB 100 MG PO CAPS: 100.0000 mg | ORAL_CAPSULE | Freq: Two times a day (BID) | ORAL | 1 refills | Status: AC | PRN

## 2024-11-05 NOTE — Progress Notes (Signed)
 "     Established patient visit   Patient: Kimberly Mercer   DOB: 15-May-1946   79 y.o. Female  MRN: [id number] Visit Date: 11/05/2024  Today's healthcare provider: Nancyann Perry, MD   Chief Complaint  Patient presents with   Follow-up    F/u Mouth sores and referral    Subjective    Ambient AI software was used to assist with clinical note transcription.   History of Present Illness   Kimberly Mercer is a 79 year old female who presents with recurrent mouth sores and systemic symptoms suggestive of an inflammatory process.  She experiences recurrent mouth sores characterized by extreme pain, swelling of the tongue, and difficulty eating or drinking. Ibuprofen provides some relief. The sores began after receiving a shot, followed by prolonged systemic symptoms. She also developed a yeast infection, which she managed at home.  Systemic symptoms include body aches, fatigue, and a feeling of heaviness, which are debilitating. Celebrex  significantly alleviates these symptoms, though it causes stomach upset. She uses ibuprofen when extreme fatigue sets in, which precedes widespread pain and redness in her hands and feet.  She applies dexamethasone  topically on her mouth sores, which improves them. Recently, her tongue swelled again, but ibuprofen and Rebif have helped.  She has a history of occasional mouth sores, with a severe episode in her thirties or forties. A previous doctor suggested an autoimmune disease, and she notes that her partner has never contracted anything from her.  She is sensitive to medications, particularly prednisone , which she finds intolerable.      Medications: Show/hide medication list[1]     Objective    BP (!) 117/48 (BP Location: Left Arm, Patient Position: Sitting, Cuff Size: Normal)   Pulse (!) 59   Resp 16   Ht 5' 6 (1.676 m)   Wt 120 lb (54.4 kg)   SpO2 100%   BMI 19.37 kg/m   Physical Exam   General appearance: Thin female,  cooperative and in no acute distress Head: Normocephalic, without obvious abnormality, atraumatic Respiratory: Respirations even and unlabored, normal respiratory rate Extremities: All extremities are intact.  Skin: Skin color, texture, turgor normal. No rashes seen  Psych: Appropriate mood and affect. Neurologic: Mental status: Alert, oriented to person, place, and time, thought content appropriate.    Assessment & Plan       Recurrent oral aphthae and systemic symptoms Recurrent oral aphthae with systemic symptoms suggest an inflammatory, possibly autoimmune process. Celebrex  effective but causes GI discomfort. Ibuprofen alleviates systemic symptoms. Prednisone  not tolerated. Dexamethasone  effective for oral sores. - Prescribed Celebrex  100 mg for inflammation management. - Referred to rheumatology for further evaluation of potential autoimmune disease. - Continue ibuprofen for systemic symptoms as needed.  Suspected Crohn's disease Symptoms and previous stool study suggest Crohn's disease. Awaiting recent stool study results. Colonoscopy deferred due to partner's surgery. - Deferred colonoscopy until after partner's surgery on February 3rd. - Await results of stool study.  Thyroid  nodule Previous ultrasound in July. Follow-up ultrasound needed. - Ordered thyroid  ultrasound for further evaluation.       - celecoxib  (CELEBREX ) 100 MG capsule; Take 1 capsule (100 mg total) by mouth 2 (two) times daily as needed.  Dispense: 30 capsule; Refill: 1 No follow-ups on file.     Nancyann Perry, MD  [facility] [phone] (phone) [phone] (fax)  [facility]     [1]  Outpatient Medications Prior to Visit  Medication Sig   acetaminophen  (  TYLENOL ) 325 MG tablet Take by mouth.   albuterol  (PROVENTIL ) (2.5 MG/3ML) 0.083% nebulizer solution Take 3 mLs (2.5 mg total) by nebulization every 6 (six) hours as needed for wheezing or shortness of  breath. Dx:J45.909   albuterol  (VENTOLIN  HFA) 108 (90 Base) MCG/ACT inhaler INHALE 2 PUFFS BY MOUTH EVERY 6 HOURS AS NEEDED FOR WHEEZING OR SHORTNESS OF BREATH   Calcium -Magnesium-Zinc (CAL-MAG-ZINC PO) Take 1 tablet by mouth daily.   CANNABIDIOL PO Take 30 mg by mouth daily. Capsule   cholecalciferol  (VITAMIN D3) 25 MCG (1000 UNIT) tablet Take 1,000 Units by mouth daily.   cyanocobalamin  (VITAMIN B12) 1000 MCG tablet Take 500 mcg by mouth daily.   diclofenac sodium (VOLTAREN) 1 % GEL Apply 2 g topically 4 (four) times daily as needed (pain.).   diphenhydrAMINE (BENADRYL) 25 MG tablet Take 25 mg by mouth every 6 (six) hours as needed (allergies.).   famotidine  (PEPCID ) 20 MG tablet Take by mouth.   fluconazole  (DIFLUCAN ) 200 MG tablet Take 1 tablet (200 mg total) by mouth every other day.   latanoprost (XALATAN) 0.005 % ophthalmic solution 1 drop at bedtime.   levothyroxine  (SYNTHROID ) 50 MCG tablet TAKE ONE TABLET BY MOUTH EVERY DAY BEFORE BREAKFAST   montelukast (SINGULAIR) 10 MG tablet TAKE ONE TABLET BY MOUTH EVERY EVENING   Multiple Vitamin (MULTIVITAMIN WITH MINERALS) TABS tablet Take 1 tablet by mouth daily.   NONFORMULARY OR COMPOUNDED ITEM Estriole 1mg /gm vaginal cream Apply 0.5gm vaginally daily   omeprazole  (PRILOSEC) 40 MG capsule Take 1 capsule (40 mg total) by mouth 2 (two) times daily as needed.   pseudoephedrine (SUDAFED) 60 MG tablet Take 30 mg by mouth every morning.   Spacer/Aero-Holding Chambers (AEROCHAMBER MV) inhaler Use as instructed   vitamin E 180 MG (400 UNITS) capsule Take 400 Units by mouth daily.    No facility-administered medications prior to visit.   "

## 2024-11-09 ENCOUNTER — Ambulatory Visit
Admission: RE | Admit: 2024-11-09 | Discharge: 2024-11-09 | Disposition: A | Discharge status: Home / Self Care | Source: Ambulatory Visit | Attending: Family Medicine | Admitting: Family Medicine

## 2024-11-09 DIAGNOSIS — E041 Nontoxic single thyroid nodule: Secondary | ICD-10-CM | POA: Insufficient documentation

## 2024-11-16 ENCOUNTER — Ambulatory Visit: Payer: Self-pay | Admitting: Family Medicine

## 2024-11-16 NOTE — Telephone Encounter (Signed)
 Copied from CRM [id number]. Topic: Referral - Status >> Nov 15, 2024  8:46 AM Victoria B wrote: Reason for CRM: Patient states, needs sooner appt for referral about her losing weight. They can't see her until April and she is still losing weight no matter how much she eats. She would like a call back about this. Asap.

## 2024-11-17 NOTE — Progress Notes (Signed)
 Last read by Rollo JENEANE Skeet Nathanel at 8:11AM on 11/17/2024.  (Results)

## 2024-11-22 ENCOUNTER — Ambulatory Visit: Payer: Self-pay

## 2024-11-22 ENCOUNTER — Encounter: Payer: Self-pay | Admitting: Family Medicine

## 2024-11-22 ENCOUNTER — Telehealth: Payer: Self-pay | Admitting: Family Medicine

## 2024-11-22 ENCOUNTER — Telehealth: Admitting: Family Medicine

## 2024-11-22 DIAGNOSIS — K1230 Oral mucositis (ulcerative), unspecified: Secondary | ICD-10-CM

## 2024-11-22 DIAGNOSIS — R634 Abnormal weight loss: Secondary | ICD-10-CM

## 2024-11-22 DIAGNOSIS — K521 Toxic gastroenteritis and colitis: Secondary | ICD-10-CM

## 2024-11-22 DIAGNOSIS — R5381 Other malaise: Secondary | ICD-10-CM

## 2024-11-22 DIAGNOSIS — K1379 Other lesions of oral mucosa: Secondary | ICD-10-CM

## 2024-11-22 MED ORDER — NYSTATIN 100000 UNIT/ML MT SUSP: 5.0000 mL | Freq: Four times a day (QID) | OROMUCOSAL | 2 refills | Status: DC

## 2024-11-22 MED ORDER — FAMCICLOVIR 250 MG PO TABS: 250.0000 mg | ORAL_TABLET | Freq: Two times a day (BID) | ORAL | 0 refills | Status: AC

## 2024-11-22 MED ORDER — LIDOCAINE VISCOUS HCL 2 % MT SOLN: 15.0000 mL | OROMUCOSAL | 2 refills | Status: AC | PRN

## 2024-11-22 NOTE — Telephone Encounter (Signed)
 Patient call note and symptoms reviewed. Agree with scheduled appt.

## 2024-11-22 NOTE — Telephone Encounter (Signed)
 Telemedicine visit completed with Dr.Robinson

## 2024-11-22 NOTE — Telephone Encounter (Signed)
 Patient states has been waiting for a call back re: rheumatology since message sent on 11/15/26. Scheduled same day virtual for acute recurrence. Please contact pt for assistance - was not able to get rheumatology appt until April. Pain is effecting quality of life and ability to eat.  FYI Only or Action Required?: Action required by provider: needs assistance with earlier rheumatology appt. Scheduled same day virtual.  Patient was last seen in primary care on 11/05/2024 by Gasper Nancyann BRAVO, MD.  Called Nurse Triage reporting Mouth Lesions.  Symptoms began a week ago.  Interventions attempted: Prescription medications: celebrex .  Symptoms are: rapidly worsening.  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 Has appt with rheumatologist, but appt not until April. Recent outbreak x 1 week. Also reports tongue is white. Pain limiting ability to eat.   1. LOCATION: Where is the mouth sore (ulcer) located?      Tongue 2. NUMBER: How many sores are there? :     Multiple 4. PAIN: Are they painful?      Yes, severe 5. ONSET: When did you first notice the sore?      This recurrence x 1 week 6. RECURRENT SYMPTOM: Have you had a mouth ulcer before? If Yes, ask: When was the last time? and What happened that time?      Yes 7. CAUSE: What do you think is causing the mouth sore?     Auto-immune  Protocols used: Mouth Ulcers-A-AH

## 2024-11-22 NOTE — Telephone Encounter (Signed)
 Note attached to mychart message with imaging sent to provider

## 2024-11-22 NOTE — Progress Notes (Signed)
 "                    MyChart Video Visit    Virtual Visit via Video Note   This format is felt to be most appropriate for this patient at this time. Physical exam was limited by quality of the video and audio technology used for the visit.    Patient location: home Provider location: [facility] Persons involved in the visit: patient, provider  I discussed the limitations of evaluation and management by telemedicine and the availability of in person appointments. The patient expressed understanding and agreed to proceed.  Patient: Kimberly Mercer   DOB: 27-Aug-1946   79 y.o. Female  MRN: [id number] Visit Date: 11/22/2024  Today's healthcare provider: Rockie Agent, MD   Chief Complaint  Patient presents with   Mouth Lesions    Oral sores, possible thrush Large sores in mouth x one week, white tongue, a lot of pain with eating. Patient reports that she also has body aches and inflammation, has been treating with celebrex . Has appt with rheumatology in April     Subjective:    HPI  Discussed the use of AI scribe software for clinical note transcription with the patient, who gave verbal consent to proceed.  History of Present Illness Kimberly Mercer is a 79 year old female with thyroid  disease and coronary artery disease who presents with oral sores and body inflammation.  She has been experiencing oral sores for the past week, which are white and located on her tongue, spreading to her throat, under her tongue, and the sides of her mouth. There is significant pain with eating. She has been using dexamethasone  and lidocaine  for relief, which have helped to some extent but have not resolved the issue.  She has a history of periodic mouth sores, with a severe episode around Christmas that left her unable to eat or drink for a week. Dexamethasone  helped flatten the sores but did not eliminate them. She has been using baking soda and peroxide  rinses, which provide temporary relief.  She reports body-wide inflammation, which led to an emergency room visit around Thanksgiving due to extreme gut inflammation. She was diagnosed with norovirus at that time. She has been managing her inflammation with Celebrex  100 mg daily, which has been effective in reducing her symptoms.  She has a history of gastrointestinal candidiasis treated with Diflucan  in 2024, but now experiences severe diarrhea with its use. She is currently on a Crohn's diet to manage her gut inflammation.  Her medication regimen includes albuterol , Celebrex , B12, Pepcid , Synthroid  50 mcg daily, Singulair 10 mg daily, Prilosec, and vitamin E. She has multiple allergies, including Claritin, fructose, ipratropium, lactose, sulfa drugs, lactase, gluten, and Levaquin.  She reports significant weight loss, down to 118 pounds from her usual 135-140 pounds, due to difficulty eating. She describes herself as 'skin and bones' and is concerned about her nutritional status.   ROS  Last CBC Lab Results  Component Value Date   WBC 4.3 08/02/2024   HGB 12.8 08/02/2024   HCT 36.6 08/02/2024   MCV 92.9 08/02/2024   MCH 32.5 08/02/2024   RDW 11.9 08/02/2024   PLT 245 08/02/2024   Last metabolic panel Lab Results  Component Value Date   GLUCOSE 94 08/02/2024   NA 140 08/02/2024   K 3.4 (L) 08/02/2024   CL 103 08/02/2024   CO2 28 08/02/2024   BUN 16 08/02/2024   CREATININE  0.77 08/02/2024   GFRNONAA >60 08/02/2024   CALCIUM  8.7 (L) 08/02/2024   PROT 6.6 08/02/2024   ALBUMIN 3.7 08/02/2024   LABGLOB 1.9 01/06/2024   AGRATIO 2.3 (H) 07/02/2022   BILITOT 0.4 08/02/2024   ALKPHOS 55 08/02/2024   AST 22 08/02/2024   ALT 14 08/02/2024   ANIONGAP 9 08/02/2024   Last lipids Lab Results  Component Value Date   CHOL 181 09/06/2024   HDL 75 09/06/2024   LDLCALC 91 09/06/2024   TRIG 84 09/06/2024   CHOLHDL 2.4 09/06/2024   Last hemoglobin A1c Lab Results  Component Value  Date   HGBA1C 5.3 01/05/2024   Last thyroid  functions Lab Results  Component Value Date   TSH 1.530 09/06/2024   T4TOTAL 7.1 03/21/2020   FREET4 1.19 09/06/2024   Last vitamin B12 and Folate Lab Results  Component Value Date   VITAMINB12 919 06/11/2024   FOLATE >20.0 03/21/2020         Objective:    There were no vitals taken for this visit.  BP Readings from Last 3 Encounters:  11/05/24 (!) 117/48  10/20/24 110/70  09/27/24 (!) 117/53   Wt Readings from Last 3 Encounters:  11/05/24 120 lb (54.4 kg)  10/20/24 124 lb (56.2 kg)  09/27/24 128 lb (58.1 kg)         Physical Exam MEASUREMENTS: Weight- 118.       Assessment & Plan:    Problem List Items Addressed This Visit   None Visit Diagnoses       Mucositis oral    -  Primary   Relevant Medications   lidocaine  (XYLOCAINE ) 2 % solution   Other Relevant Orders   Ambulatory referral to Rheumatology     Other lesions of oral mucosa       Relevant Medications   famciclovir  (FAMVIR ) 250 MG tablet   magic mouthwash (nystatin , lidocaine , diphenhydrAMINE, alum & mag hydroxide) suspension   lidocaine  (XYLOCAINE ) 2 % solution   Other Relevant Orders   Ambulatory referral to Rheumatology     Diarrhea due to drug         Unintentional weight loss         Malaise           Assessment and Plan Assessment & Plan 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 Previous treatment with dexamethasone  and lidocaine  provided temporary relief. Concerns about potential autoimmune etiology. Unable to tolerate oral fluconazole  due to severe diarrhea. Considering antiviral treatment as an alternative. Magic Mouthwash prescribed to manage symptoms without affecting the GI tract. - Prescribed famciclovir  twice daily for 7 days. - Prescribed Magic Mouthwash with lidocaine , nystatin , Benadryl for topical application. -  Will consider Depamedrol injection for systemic inflammation if antiviral treatment is ineffective.  Received message that magic mouthwash was not available at patient's pharmacy, sent prescription for viscous lidocaine  for the patient to swish and discard to help with oral pain   Malaise  Chronic systemic inflammatory symptoms Chronic systemic inflammation affecting multiple body parts, including hands, knees, and gut. Managed with Celebrex , which has been effective in controlling symptoms. Concerns about potential autoimmune disorder. Unable to tolerate prednisone  due to adverse effects. Considering alternative anti-inflammatory treatments. - Continue Celebrex  as needed for inflammation control. - Will consider Depamedrol injection for systemic inflammation if antiviral treatment is ineffective.  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 Current management includes a Crohn's diet to control inflammation. Unable to tolerate oral fluconazole  due to severe diarrhea. Concerns about maintaining GI stability during husband's upcoming surgery. - Continue current dietary management to control inflammation. - Avoid oral fluconazole  due to risk of severe diarrhea.  Unintentional weight loss Significant unintentional weight loss, currently weighing 118 pounds, down from a baseline of 135-140 pounds. Difficulty eating due to oral ulcers, leading to further weight loss. Concerns about nutritional status and ability to maintain weight.    Meds ordered this encounter  Medications   famciclovir  (FAMVIR ) 250 MG tablet    Sig: Take 1 tablet (250 mg total) by mouth 2 (two) times daily for 7 days.    Dispense:  14 tablet    Refill:  0   magic mouthwash (nystatin , lidocaine , diphenhydrAMINE, alum & mag hydroxide) suspension    Sig: Swish and spit 5 mLs 4 (four) times daily.    Dispense:  180 mL    Refill:  2   lidocaine   (XYLOCAINE ) 2 % solution    Sig: Use as directed 15 mLs in the mouth or throat as needed for mouth pain.    Dispense:  100 mL    Refill:  2     No follow-ups on file.     I discussed the assessment and treatment plan with the patient. The patient was provided an opportunity to ask questions and all were answered. The patient agreed with the plan and demonstrated an understanding of the instructions.   The patient was advised to call back or seek an in-person evaluation if the symptoms worsen or if the condition fails to improve as anticipated.     I provided 30 minutes of non-face-to-face time during this encounter.  Rockie Agent, MD [facility]    "

## 2024-11-22 NOTE — Patient Instructions (Signed)
 To keep you healthy, please keep in mind the following health maintenance items that you are due for:   Health Maintenance Due  Topic Date Due   Bone Density Scan  Never done   Zoster Vaccines- Shingrix (1 of 2) 03/27/1967   DTaP/Tdap/Td (3 - Td or Tdap) 12/23/2020   COVID-19 Vaccine (4 - 2025-26 season) 06/21/2024   Mammogram  12/17/2024     Best Wishes,   Dr. Lang

## 2024-11-24 ENCOUNTER — Ambulatory Visit: Payer: Self-pay

## 2024-11-24 NOTE — Telephone Encounter (Signed)
 FYI Only or Action Required?: Action required by provider: clinical question for provider and update on patient condition.  Patient was last seen in primary care on 11/22/2024 by [name], MD.  Called Nurse Triage reporting Medication Problem.   Triage Disposition: Call PCP When Office is Open  Patient/caregiver understands and will follow disposition?: Yes   Copied from CRM [id number]. Topic: Clinical - Prescription Issue >> Nov 24, 2024  2:48 PM Willma R wrote: Reason for CRM: Syndey from [employer] calling in regards to patients prescription for famciclovir  (FAMVIR ) 250 MG tablet. Medication was delayed and not available until today, patient is questioning if she still needs to take the medication. Would like a callback to confirm  [place] can be reached at [phone]   Reason for Disposition  [1] Caller requesting NON-URGENT health information AND [2] PCP's office is the best resource  Answer Assessment - Initial Assessment Questions 1. REASON FOR CALL: What is the main reason for your call? or How can I best help you?     No indication from office notes or encounters that pt should not take rx. Will route to clinic to notify provider that rx was delayed for shipment to pharmacy and pt will not be able to start until later today.  Protocols used: Information Only Call - No Triage-A-AH

## 2024-11-24 NOTE — Telephone Encounter (Signed)
 If her mouth has clear up then she doesn't need to take it. Otherwise she should.

## 2024-11-25 ENCOUNTER — Ambulatory Visit: Admitting: Family Medicine

## 2024-11-25 ENCOUNTER — Encounter: Payer: Self-pay | Admitting: Family Medicine

## 2024-11-25 VITALS — BP 112/61 | HR 64 | Ht 66.0 in | Wt 120.0 lb

## 2024-11-25 DIAGNOSIS — M2550 Pain in unspecified joint: Secondary | ICD-10-CM

## 2024-11-25 DIAGNOSIS — K068 Other specified disorders of gingiva and edentulous alveolar ridge: Secondary | ICD-10-CM

## 2024-11-25 DIAGNOSIS — K1230 Oral mucositis (ulcerative), unspecified: Secondary | ICD-10-CM

## 2024-11-25 DIAGNOSIS — K1379 Other lesions of oral mucosa: Secondary | ICD-10-CM

## 2024-11-25 DIAGNOSIS — M791 Myalgia, unspecified site: Secondary | ICD-10-CM

## 2024-11-25 DIAGNOSIS — G8929 Other chronic pain: Secondary | ICD-10-CM

## 2024-11-25 MED ORDER — COLCHICINE 0.6 MG PO CAPS: 1.2000 mg | ORAL_CAPSULE | Freq: Two times a day (BID) | ORAL | 2 refills | Status: AC

## 2024-11-25 MED ORDER — NYSTATIN 100000 UNIT/ML MT SUSP: 5.0000 mL | Freq: Four times a day (QID) | OROMUCOSAL | 2 refills | Status: AC

## 2024-11-25 NOTE — Patient Instructions (Signed)
 To keep you healthy, please keep in mind the following health maintenance items that you are due for:   Health Maintenance Due  Topic Date Due   Bone Density Scan  Never done   Zoster Vaccines- Shingrix (1 of 2) 03/27/1967   DTaP/Tdap/Td (3 - Td or Tdap) 12/23/2020   COVID-19 Vaccine (4 - 2025-26 season) 06/21/2024   Mammogram  12/17/2024     Best Wishes,   Dr. Lang

## 2024-11-25 NOTE — Progress Notes (Unsigned)
" ° °  Acute Office Visit  Patient ID: Kimberly Mercer, female    DOB: 09-28-1946, 79 y.o.   MRN: [id number]  PCP: Gasper Nancyann BRAVO, MD  Chief Complaint  Patient presents with   Mouth Lesions    Patient is present for in person exam of mouth lesions, saw provider virtually on Monday and wanted to evaluate F2F.   Patient has not picked up antiviral yet and mouthwash needs to be re sent to [facility] (compounding pharmacy)      Subjective:     HPI  Discussed the use of AI scribe software for clinical note transcription with the patient, who gave verbal consent to proceed.  History of Present Illness    ROS     Objective:    BP 112/61 (BP Location: Left Arm, Patient Position: Sitting, Cuff Size: Normal)   Pulse 64   Ht 5' 6 (1.676 m)   Wt 120 lb (54.4 kg)   SpO2 99%   BMI 19.37 kg/m  BP Readings from Last 3 Encounters:  11/25/24 112/61  11/05/24 (!) 117/48  10/20/24 110/70   Wt Readings from Last 3 Encounters:  11/25/24 120 lb (54.4 kg)  11/05/24 120 lb (54.4 kg)  10/20/24 124 lb (56.2 kg)      Physical Exam Vitals reviewed.  Constitutional:      General: She is not in acute distress.    Appearance: Normal appearance. She is not ill-appearing.  Pulmonary:     Effort: Pulmonary effort is normal. No respiratory distress.  Neurological:     Mental Status: She is alert and oriented to person, place, and time.  Psychiatric:        Mood and Affect: Mood normal.        Behavior: Behavior normal.        Thought Content: Thought content normal.     {PhysExam Abridge (Optional):[id number]}  No results found for any visits on 11/25/24.     Assessment & Plan:   Problem List Items Addressed This Visit   None Visit Diagnoses       Mucositis oral    -  Primary     Other lesions of oral mucosa       Relevant Medications   magic mouthwash (nystatin , lidocaine , diphenhydrAMINE, alum & mag hydroxide) suspension       Assessment and Plan Assessment &  Plan     Meds ordered this encounter  Medications   magic mouthwash (nystatin , lidocaine , diphenhydrAMINE, alum & mag hydroxide) suspension    Sig: Swish and spit 5 mLs 4 (four) times daily.    Dispense:  180 mL    Refill:  2    No follow-ups on file.  Rockie Agent, MD [facility]   "

## 2024-11-26 LAB — CMP14+EGFR
ALT: 19 [IU]/L (ref 0–32)
AST: 30 [IU]/L (ref 0–40)
Albumin: 4.5 g/dL (ref 3.8–4.8)
Alkaline Phosphatase: 92 [IU]/L (ref 49–135)
BUN/Creatinine Ratio: 32 — ABNORMAL HIGH (ref 12–28)
BUN: 23 mg/dL (ref 8–27)
Bilirubin Total: 0.2 mg/dL (ref 0.0–1.2)
CO2: 24 mmol/L (ref 20–29)
Calcium: 9.6 mg/dL (ref 8.7–10.3)
Chloride: 103 mmol/L (ref 96–106)
Creatinine, Ser: 0.73 mg/dL (ref 0.57–1.00)
Globulin, Total: 2.4 g/dL (ref 1.5–4.5)
Glucose: 100 mg/dL — ABNORMAL HIGH (ref 70–99)
Potassium: 4.4 mmol/L (ref 3.5–5.2)
Sodium: 141 mmol/L (ref 134–144)
Total Protein: 6.9 g/dL (ref 6.0–8.5)
eGFR: 85 mL/min/{1.73_m2}

## 2024-11-26 LAB — CBC WITH DIFFERENTIAL/PLATELET
Basophils Absolute: 0 10*3/uL (ref 0.0–0.2)
Basos: 1 %
EOS (ABSOLUTE): 0.1 10*3/uL (ref 0.0–0.4)
Eos: 2 %
Hematocrit: 45.7 % (ref 34.0–46.6)
Hemoglobin: 15 g/dL (ref 11.1–15.9)
Immature Grans (Abs): 0 10*3/uL (ref 0.0–0.1)
Immature Granulocytes: 0 %
Lymphocytes Absolute: 1.5 10*3/uL (ref 0.7–3.1)
Lymphs: 31 %
MCH: 31.6 pg (ref 26.6–33.0)
MCHC: 32.8 g/dL (ref 31.5–35.7)
MCV: 96 fL (ref 79–97)
Monocytes Absolute: 0.5 10*3/uL (ref 0.1–0.9)
Monocytes: 11 %
Neutrophils Absolute: 2.7 10*3/uL (ref 1.4–7.0)
Neutrophils: 55 %
Platelets: 266 10*3/uL (ref 150–450)
RBC: 4.74 x10E6/uL (ref 3.77–5.28)
RDW: 11.9 % (ref 11.7–15.4)
WBC: 4.9 10*3/uL (ref 3.4–10.8)

## 2024-11-26 LAB — CK: Total CK: 249 U/L — ABNORMAL HIGH (ref 32–182)

## 2024-11-26 LAB — ANA+ENA+DNA/DS+ANTICH+CENTR
ANA Titer 1: NEGATIVE
Anti JO-1: <0.2 AI (ref 0.0–0.9)
Centromere Ab Screen: <0.2 AI (ref 0.0–0.9)
Chromatin Ab SerPl-aCnc: <0.2 AI (ref 0.0–0.9)
ENA RNP Ab: <0.2 AI (ref 0.0–0.9)
ENA SM Ab Ser-aCnc: <0.2 AI (ref 0.0–0.9)
ENA SSA (RO) Ab: 0.2 AI (ref 0.0–0.9)
ENA SSB (LA) Ab: <0.2 AI (ref 0.0–0.9)
Scleroderma (Scl-70) (ENA) Antibody, IgG: <0.2 AI (ref 0.0–0.9)
dsDNA Ab: 1 [IU]/mL (ref 0–9)

## 2024-11-26 LAB — TSH+FREE T4
Free T4: 1.25 ng/dL (ref 0.82–1.77)
TSH: 1.92 u[IU]/mL (ref 0.450–4.500)

## 2024-11-26 LAB — CYCLIC CITRUL PEPTIDE ANTIBODY, IGG/IGA

## 2024-11-26 LAB — RHEUMATOID FACTOR: Rheumatoid fact SerPl-aCnc: <10 [IU]/mL

## 2024-11-26 LAB — SEDIMENTATION RATE: Sed Rate: 4 mm/h (ref 0–40)

## 2024-12-17 ENCOUNTER — Ambulatory Visit: Admit: 2024-12-17 | Admitting: Gastroenterology

## 2024-12-20 ENCOUNTER — Encounter

## 2025-01-06 ENCOUNTER — Ambulatory Visit: Admitting: "Endocrinology

## 2025-01-31 ENCOUNTER — Ambulatory Visit: Admitting: Internal Medicine

## 2025-02-16 ENCOUNTER — Ambulatory Visit

## 2025-03-03 ENCOUNTER — Ambulatory Visit: Admitting: Family Medicine
